# Patient Record
Sex: Male | Born: 1950
Health system: Southern US, Community
[De-identification: ages and names within clinical notes are randomized; demographics above are authoritative.]

## PROBLEM LIST (undated history)

## (undated) DIAGNOSIS — K219 Gastro-esophageal reflux disease without esophagitis: Secondary | ICD-10-CM

## (undated) DIAGNOSIS — I10 Essential (primary) hypertension: Secondary | ICD-10-CM

## (undated) DIAGNOSIS — G4730 Sleep apnea, unspecified: Secondary | ICD-10-CM

## (undated) DIAGNOSIS — E119 Type 2 diabetes mellitus without complications: Secondary | ICD-10-CM

## (undated) DIAGNOSIS — Z8619 Personal history of other infectious and parasitic diseases: Secondary | ICD-10-CM

## (undated) HISTORY — DX: Sleep apnea, unspecified: G47.30

## (undated) HISTORY — DX: Personal history of other infectious and parasitic diseases: Z86.19

## (undated) HISTORY — DX: Type 2 diabetes mellitus without complications: E11.9

## (undated) HISTORY — DX: Gastro-esophageal reflux disease without esophagitis: K21.9

## (undated) HISTORY — DX: Essential (primary) hypertension: I10

---

## 2004-08-20 ENCOUNTER — Emergency Department (HOSPITAL_COMMUNITY): Admission: EM | Admit: 2004-08-20 | Discharge: 2004-08-20 | Payer: Self-pay | Admitting: Family Medicine

## 2008-03-12 ENCOUNTER — Ambulatory Visit: Admission: RE | Admit: 2008-03-12 | Discharge: 2008-03-12 | Payer: Self-pay | Admitting: Physician Assistant

## 2011-02-03 ENCOUNTER — Encounter: Payer: Self-pay | Admitting: Cardiovascular Disease

## 2011-02-03 ENCOUNTER — Ambulatory Visit (INDEPENDENT_AMBULATORY_CARE_PROVIDER_SITE_OTHER): Payer: 59 | Admitting: Cardiovascular Disease

## 2011-02-03 DIAGNOSIS — I10 Essential (primary) hypertension: Secondary | ICD-10-CM

## 2011-02-03 DIAGNOSIS — R072 Precordial pain: Secondary | ICD-10-CM

## 2011-02-03 DIAGNOSIS — E1159 Type 2 diabetes mellitus with other circulatory complications: Secondary | ICD-10-CM | POA: Insufficient documentation

## 2011-02-03 DIAGNOSIS — I152 Hypertension secondary to endocrine disorders: Secondary | ICD-10-CM | POA: Insufficient documentation

## 2011-02-10 NOTE — Assessment & Plan Note (Signed)
Summary: np6   Visit Type:  Initial Consult Primary Provider:  Doy Hutching. Caryn Bee  CC:  Chest tightness for about a week.  History of Present Illness: 60 year-old male presents for initial evaluation of chest pain and palpitations.  The patient complians of a constant left-sided chest pain, described as a 'pressure.' The symptom duration is approximately one week. He states that exertion does not increase his symptoms. He denies lightheadedness, weakness, edema, shortness of breath, or syncope. He has no past history of similar symptoms and no history of cardiac problems.  He had some palpitations this am and was documented to have PVC's.   He drinks one cup of coffee daily, and used to drink a lot of caffeine but is not a heavy caffeine drinker.   Current Medications (verified): 1)  Altace 2.5 Mg Caps (Ramipril) .... Take 1 Tablet By Mouth Once A Day 2)  Aspirin Ec 325 Mg Tbec (Aspirin) .... Take One Tablet By Mouth Daily 3)  Multivitamins  Tabs (Multiple Vitamin) .... Take 1 Tablet By Mouth Once A Day  Allergies (verified): No Known Drug Allergies  Past History:  Past medical, surgical, family and social histories (including risk factors) reviewed, and no changes noted (except as noted below).  Past Medical History: Hypertension, essential. Diagnosed 2007.  Obstructive sleep apnea - uses CPAP  Past Surgical History: none  Family History: Reviewed history and no changes required. Mother died CHF at 93 Father had diabetes, but died in an accident at 18 No premature CAD in family  Social History: Reviewed history and no changes required. Works at [employer] three times per week  Review of Systems       Positive for bilateral knee pain, seasonal allergies, otherwise negative except as per HPI.  Vital Signs:  Patient profile:   60 year old male Height:      71 inches Weight:      268.25 pounds BMI:     37.55 Pulse rate:   75 /  minute Pulse rhythm:   regular Resp:     18 per minute BP sitting:   131 / 83  (left arm) Cuff size:   large  Vitals Entered By: Vikki Ports (February 03, 2011 3:54 PM)  Physical Exam  General:  Pt is well-developed, obese male, alert and oriented, no acute distress HEENT: normal Neck: no thyromegaly           JVP normal, carotid upstrokes normal without bruits Lungs: CTA Chest: equal expansion  CV: Apical impulse nondisplaced, RRR without murmur or gallop Abd: soft, NT, positive BS, no HSM, no bruit Back: no CVA tenderness Ext: no clubbing, cyanosis, or edema        femoral pulses 2+ without bruits        pedal pulses 2+ and equal Skin: warm, dry, no rash Neuro: CNII-XII intact,strength 5/5 = b/l    EKG  Procedure date:  02/03/2011  Findings:      NSR with frequent PVC's HR 75 bpm.  Impression & Recommendations:  Problem # 1:  CHEST PAIN, PRECORDIAL (ICD-786.51) The patient has chest pain with typical and atypical features. His underlying CV risk factors include obesity and HTN. Recommend an exercise Myoview stress test to rule out significant ischemia. Will otherwise continue ASA for antiplatelet Rx. Will followup with him after his stress test results are back.  His updated medication list for this problem includes:    Altace 2.5 Mg Caps (Ramipril) .Marland Kitchen... Take  1 tablet by mouth once a day    Aspirin Ec 325 Mg Tbec (Aspirin) .Marland Kitchen... Take one tablet by mouth daily  Orders: Nuclear Stress Test (Nuc Stress Test) EKG w/ Interpretation (93000)  Problem # 2:  HYPERTENSION, BENIGN (ICD-401.1) BP controlled.  BP today: 131/83  His updated medication list for this problem includes:    Altace 2.5 Mg Caps (Ramipril) .Marland Kitchen... Take 1 tablet by mouth once a day    Aspirin Ec 325 Mg Tbec (Aspirin) .Marland Kitchen... Take one tablet by mouth daily  Patient Instructions: 1)  Your physician recommends that you schedule a follow-up appointment as needed.  2)  Your physician recommends that  you continue on your current medications as directed. Please refer to the Current Medication list given to you today. 3)  Your physician has requested that you have an exercise stress myoview.  For further information please visit [email or web address]  Please follow instruction sheet, as given.

## 2011-02-18 ENCOUNTER — Telehealth (INDEPENDENT_AMBULATORY_CARE_PROVIDER_SITE_OTHER): Payer: Self-pay | Admitting: Radiology

## 2011-02-19 ENCOUNTER — Encounter: Payer: Self-pay | Admitting: Cardiology

## 2011-02-19 ENCOUNTER — Ambulatory Visit (HOSPITAL_COMMUNITY): Payer: 59 | Attending: Cardiovascular Disease

## 2011-02-19 DIAGNOSIS — R0789 Other chest pain: Secondary | ICD-10-CM

## 2011-02-19 DIAGNOSIS — R079 Chest pain, unspecified: Secondary | ICD-10-CM | POA: Insufficient documentation

## 2011-02-24 NOTE — Assessment & Plan Note (Addendum)
Summary: Cardiology Nuclear Testing  Nuclear Med Background Indications for Stress Test: Evaluation for Ischemia    History Comments: No documented CAD  Symptoms: Chest Pressure, DOE, Fatigue, Fatigue with Exertion, Palpitations, Rapid HR  Symptoms Comments: Last episode of CP:2 weeks ago   Nuclear Pre-Procedure Cardiac Risk Factors: Hypertension, Obesity Caffeine/Decaff Intake: None NPO After: 4:30 AM Lungs: Clear IV 0.9% NS with Angio Cath: 20g     IV Site: R Antecubital IV Started by: Irean Hong, RN Chest Size (in) 46     Height (in): 71 Weight (lb): 260 BMI: 36.39  Nuclear Med Study 1 or 2 day study:  1 day     Stress Test Type:  Stress Reading MD:  Willa Rough, MD     Referring MD:  Tonny Bollman, MD Resting Radionuclide:  Technetium 29m Tetrofosmin     Resting Radionuclide Dose:  11 mCi  Stress Radionuclide:  Technetium 66m Tetrofosmin     Stress Radionuclide Dose:  33 mCi   Stress Protocol Exercise Time (min):  9:01 min     Max HR:  116 bpm     Predicted Max HR:  162 bpm  Max Systolic BP: 173 mm Hg     Percent Max HR:  71.60 %     METS: 8.2 Rate Pressure Product:  09811  Lexiscan: 0.4 mg   Stress Test Technologist:  [employer], CMA-N     Nuclear Technologist:  Domenic Polite, CNMT  Rest Procedure  Myocardial perfusion imaging was performed at rest 45 minutes following the intravenous administration of Technetium 8m Tetrofosmin.  Stress Procedure  Patient attempted to walk on the treadmill utilizing the Bruce protocol for 7:45, but was unable to obtain his heart rate.   He was then given IV Lexiscan 0.4 mg over 15-seconds with concurrent low level exercise and then Technetium 24m Tetrofosmin was injected at 30-seconds.  There were no significant changes with infusion.  There were occasional PVC's with couplets.    Quantitative spect images were obtained after a 45 minute delay.  QPS Raw Data Images:  Patient motion noted; appropriate software correction  applied. Stress Images:  No diagnostic abnormalities Rest Images:  Same as stress Subtraction (SDS):  No evidence of ischemia. Transient Ischemic Dilatation:  1.13  (Normal <1.22)  Lung/Heart Ratio:  .31  (Normal <0.45)  Quantitative Gated Spect Images QGS EDV:  116 ml QGS ESV:  47 ml QGS EF:  59 % QGS cine images:  Normal  Findings Normal nuclear study      Overall Impression  Exercise Capacity: Lexiscan with moderate exercise BP Response: Normal blood pressure response. Clinical Symptoms: No chest pain ECG Impression: No significant ST segment change suggestive of ischemia. Overall Impression: Normal stress nuclear study. Overall Impression Comments: The patient could not increase heart rate adequately with stress. The study was changed to [place].  Appended Document: Cardiology Nuclear Testing normal result noted. no further cardiac eval needed at this time.  Appended Document: Cardiology Nuclear Testing Pt aware of results by phone.

## 2011-02-24 NOTE — Progress Notes (Signed)
Summary: Nuclear Pre-Procedure  Phone Note Outgoing Call Call back at [facility] Phone [phone]   Call placed by: Stanton Kidney, EMT-P,  February 18, 2011 2:39 PM Call placed to: Patient Action Taken: Phone Call Completed Reason for Call: Confirm/change Appt Summary of Call: Left message with information on Myoview Information Sheet (see scanned document for details). Stanton Kidney, EMT-P  February 18, 2011 2:39 PM      Nuclear Med Background Indications for Stress Test: Evaluation for Ischemia     Symptoms: Chest Pressure, Palpitations    Nuclear Pre-Procedure Cardiac Risk Factors: Hypertension Height (in): 71

## 2011-04-28 NOTE — Procedures (Signed)
NAME:  [name], [name]            ACCOUNT NO.:  [id number]   MEDICAL RECORD NO.:  [id number]          PATIENT TYPE:  OUT   LOCATION:  SLEE                          FACILITY:  [facility]   PHYSICIAN:  Kofi A. Gerilyn Pilgrim, M.D. DATE OF BIRTH:  Nov 07, 1951   DATE OF PROCEDURE:  03/12/2008  DATE OF DISCHARGE:                             SLEEP DISORDER REPORT   POLYSOMNOGRAPHY REPORT   REFERRING PHYSICIAN:  Joette Catching.   RECORDING DATE:  03/05/2008.   INDICATIONS:  A 60 year old man who has fatigue, hypersomnia, and loud  snoring.  He is being evaluated for possible obstructive sleep apnea  syndrome.   MEDICATION:  Ramipril.   Epworth sleepiness scale 19.  BMI 33.   SLEEP STAGE SUMMARY:  The total recording time is 364 minutes.  Sleep  efficiency 70%.  Sleep latency 40 minutes.  REM latency 73 minutes.  Stage N1 at 11%, N2 at 73%, N3 at 0%.  Stage REM sleep 14%.   RESPIRATORY SUMMARY:  The baseline oxygen saturation is 96%.  Lowest 82%  during REM sleep.  The AHI index is 37.   LEG MOVEMENT SUMMARY:  PLM index 9.2.   ELECTROCARDIOGRAM SUMMARY:  Average heart rate 64 with occasional PVCs  observed.   IMPRESSION:  1. Moderate obstructive sleep apnea syndrome.  2. Mild periodic leg movement of sleep.   RECOMMENDATIONS:  Formal titration study.   Thanks for this referral.      Kofi A. Gerilyn Pilgrim, M.D.  Electronically Signed     KAD/MEDQ  D:  03/13/2008  T:  03/13/2008  Job:  [id number]

## 2012-09-13 HISTORY — PX: COLONOSCOPY: SHX174

## 2013-06-09 ENCOUNTER — Encounter: Payer: Self-pay | Admitting: Family Medicine

## 2013-06-09 ENCOUNTER — Ambulatory Visit (INDEPENDENT_AMBULATORY_CARE_PROVIDER_SITE_OTHER): Payer: 59 | Admitting: Family Medicine

## 2013-06-09 VITALS — BP 151/94 | HR 62 | Temp 97.9°F | Ht 71.0 in | Wt 262.8 lb

## 2013-06-09 DIAGNOSIS — I10 Essential (primary) hypertension: Secondary | ICD-10-CM

## 2013-06-09 DIAGNOSIS — M129 Arthropathy, unspecified: Secondary | ICD-10-CM

## 2013-06-09 DIAGNOSIS — M1990 Unspecified osteoarthritis, unspecified site: Secondary | ICD-10-CM

## 2013-06-09 DIAGNOSIS — K219 Gastro-esophageal reflux disease without esophagitis: Secondary | ICD-10-CM

## 2013-06-09 MED ORDER — OMEPRAZOLE 40 MG PO CPDR: 40.0000 mg | DELAYED_RELEASE_CAPSULE | Freq: Every day | ORAL | Status: DC

## 2013-06-09 MED ORDER — RAMIPRIL 5 MG PO CAPS: 5.0000 mg | ORAL_CAPSULE | Freq: Every day | ORAL | Status: DC

## 2013-06-09 MED ORDER — NABUMETONE 750 MG PO TABS: 750.0000 mg | ORAL_TABLET | Freq: Two times a day (BID) | ORAL | Status: DC

## 2013-06-09 NOTE — Patient Instructions (Signed)

## 2013-06-09 NOTE — Progress Notes (Signed)
  Subjective:    Patient ID: [name], male    DOB: 1951/04/11, 63 y.o.   MRN: [id number]  HPI This 62 y.o. male presents for evaluation of hypertension, arthritis, and GERD.  He needs refills on omeprazole, relafen, and rampiril.  He states the rampiril controls his htn well.  He states he needs to take omeprazole daily for GERD which controls it well.  He has arthritis and relafen works well.   Review of Systems    No chest pain, SOB, HA, dizziness, vision change, N/V, diarrhea, constipation, dysuria, urinary urgency or frequency, myalgias, arthralgias or rash.  Objective:   Physical Exam  Vital signs noted  Well developed well nourished male.  HEENT - Head atraumatic Normocephalic                Eyes - PERRLA, Conjuctiva - clear Sclera- Clear EOMI                Ears - EAC's Wnl TM's Wnl Gross Hearing WNL                Nose - Nares patent                 Throat - oropharanx wnl Respiratory - Lungs CTA bilateral Cardiac - RRR S1 and S2 without murmur GI - Abdomen soft Nontender and bowel sounds active x 4 Extremities - No edema. Neuro - Grossly intact. Skin - Bilateral axilla with skin tags.     Assessment & Plan:  Arthritis - Plan: nabumetone (RELAFEN) 750 MG tablet  GERD (gastroesophageal reflux disease) - Plan: omeprazole (PRILOSEC) 40 MG capsule  Essential hypertension, benign - Plan: ramipril (ALTACE) 5 MG capsule  Follow up for separate appointment to have skin tags removed.  Follow up for CPE in 6 months.

## 2013-10-19 ENCOUNTER — Other Ambulatory Visit: Payer: Self-pay

## 2013-12-27 ENCOUNTER — Other Ambulatory Visit: Payer: Self-pay | Admitting: Family Medicine

## 2014-01-29 ENCOUNTER — Encounter: Payer: Self-pay | Admitting: General Practice

## 2014-01-29 ENCOUNTER — Ambulatory Visit (INDEPENDENT_AMBULATORY_CARE_PROVIDER_SITE_OTHER): Payer: 59 | Admitting: General Practice

## 2014-01-29 ENCOUNTER — Encounter: Payer: Self-pay | Admitting: Family Medicine

## 2014-01-29 VITALS — BP 148/92 | HR 69 | Temp 97.1°F | Ht 71.0 in | Wt 268.0 lb

## 2014-01-29 DIAGNOSIS — N390 Urinary tract infection, site not specified: Secondary | ICD-10-CM

## 2014-01-29 DIAGNOSIS — M25561 Pain in right knee: Secondary | ICD-10-CM

## 2014-01-29 DIAGNOSIS — R350 Frequency of micturition: Secondary | ICD-10-CM

## 2014-01-29 DIAGNOSIS — M25569 Pain in unspecified knee: Secondary | ICD-10-CM

## 2014-01-29 DIAGNOSIS — M25562 Pain in left knee: Secondary | ICD-10-CM

## 2014-01-29 LAB — POCT URINALYSIS DIPSTICK
BILIRUBIN UA: NEGATIVE
Blood, UA: NEGATIVE
Glucose, UA: NEGATIVE
KETONES UA: NEGATIVE
LEUKOCYTES UA: NEGATIVE
Nitrite, UA: NEGATIVE
Protein, UA: NEGATIVE
Spec Grav, UA: 1.01
Urobilinogen, UA: NEGATIVE
pH, UA: 7

## 2014-01-29 LAB — POCT UA - MICROSCOPIC ONLY
Bacteria, U Microscopic: NEGATIVE
Casts, Ur, LPF, POC: NEGATIVE
Crystals, Ur, HPF, POC: NEGATIVE
Epithelial cells, urine per micros: NEGATIVE
Mucus, UA: NEGATIVE
RBC, URINE, MICROSCOPIC: NEGATIVE
WBC, UR, HPF, POC: NEGATIVE
Yeast, UA: NEGATIVE

## 2014-01-29 MED ORDER — CIPROFLOXACIN HCL 500 MG PO TABS: 500.0000 mg | ORAL_TABLET | Freq: Two times a day (BID) | ORAL | Status: DC

## 2014-01-29 MED ORDER — IBUPROFEN 800 MG PO TABS: 800.0000 mg | ORAL_TABLET | Freq: Two times a day (BID) | ORAL | Status: DC | PRN

## 2014-01-29 NOTE — Progress Notes (Signed)
   Subjective:    Patient ID: [name], male    DOB: 1951-01-11, 63 y.o.   MRN: [id number]  Urinary Frequency  This is a new problem. The current episode started in the past 7 days. The problem occurs every urination. The problem has been gradually worsening. The quality of the pain is described as aching and burning. The pain is at a severity of 5/10. There has been no fever. He is not sexually active. There is no history of pyelonephritis. Associated symptoms include flank pain, frequency and urgency. Pertinent negatives include no chills. He has tried increased fluids for the symptoms. There is no history of catheterization, kidney stones, recurrent UTIs or a urological procedure.  Reports having knee pain and nabumetone is no longer effective. Reports ibuprofen is very effective.     Review of Systems  Constitutional: Negative for fever and chills.  Respiratory: Negative for chest tightness and shortness of breath.   Cardiovascular: Negative for chest pain and palpitations.  Genitourinary: Positive for dysuria, urgency, frequency and flank pain.  Neurological: Negative for dizziness, weakness and headaches.       Objective:   Physical Exam  Constitutional: He is oriented to person, place, and time. He appears well-developed and well-nourished.  Cardiovascular: Normal rate, regular rhythm and normal heart sounds.   Pulmonary/Chest: Effort normal and breath sounds normal. No respiratory distress. He exhibits no tenderness.  Abdominal: Soft. Bowel sounds are normal. He exhibits no distension. There is tenderness in the suprapubic area.  Neurological: He is alert and oriented to person, place, and time.  Skin: Skin is warm and dry.  Psychiatric: He has a normal mood and affect.     Results for orders placed in visit on 01/29/14  POCT UA - MICROSCOPIC ONLY      Result Value Ref Range   WBC, Ur, HPF, POC NEG     RBC, urine, microscopic NEG     Bacteria, U Microscopic NEG     Mucus, UA NEG     Epithelial cells, urine per micros NEG     Crystals, Ur, HPF, POC NEG     Casts, Ur, LPF, POC NEG     Yeast, UA NEG          Assessment & Plan:  1. Frequent urination  - POCT UA - Microscopic Only - POCT urinalysis dipstick  2. Knee pain, bilateral  - ibuprofen (ADVIL,MOTRIN) 800 MG tablet; Take 1 tablet (800 mg total) by mouth every 12 (twelve) hours as needed.  Dispense: 30 tablet; Refill: 1  3. UTI (urinary tract infection)  - ciprofloxacin (CIPRO) 500 MG tablet; Take 1 tablet (500 mg total) by mouth 2 (two) times daily.  Dispense: 20 tablet; Refill: 0 -increase fluids -void frequently RTO prn Patient verbalized understanding Erby Pian, FNP-C

## 2014-01-29 NOTE — Patient Instructions (Signed)
Urinary Tract Infection  Urinary tract infections (UTIs) can develop anywhere along your urinary tract. Your urinary tract is your body's drainage system for removing wastes and extra water. Your urinary tract includes two kidneys, two ureters, a bladder, and a urethra. Your kidneys are a pair of bean-shaped organs. Each kidney is about the size of your fist. They are located below your ribs, one on each side of your spine.  CAUSES  Infections are caused by microbes, which are microscopic organisms, including fungi, viruses, and bacteria. These organisms are so small that they can only be seen through a microscope. Bacteria are the microbes that most commonly cause UTIs.  SYMPTOMS   Symptoms of UTIs may vary by age and gender of the patient and by the location of the infection. Symptoms in young women typically include a frequent and intense urge to urinate and a painful, burning feeling in the bladder or urethra during urination. Older women and men are more likely to be tired, shaky, and weak and have muscle aches and abdominal pain. A fever may mean the infection is in your kidneys. Other symptoms of a kidney infection include pain in your back or sides below the ribs, nausea, and vomiting.  DIAGNOSIS  To diagnose a UTI, your caregiver will ask you about your symptoms. Your caregiver also will ask to provide a urine sample. The urine sample will be tested for bacteria and white blood cells. White blood cells are made by your body to help fight infection.  TREATMENT   Typically, UTIs can be treated with medication. Because most UTIs are caused by a bacterial infection, they usually can be treated with the use of antibiotics. The choice of antibiotic and length of treatment depend on your symptoms and the type of bacteria causing your infection.  HOME CARE INSTRUCTIONS   If you were prescribed antibiotics, take them exactly as your caregiver instructs you. Finish the medication even if you feel better after you  have only taken some of the medication.   Drink enough water and fluids to keep your urine clear or pale yellow.   Avoid caffeine, tea, and carbonated beverages. They tend to irritate your bladder.   Empty your bladder often. Avoid holding urine for long periods of time.   Empty your bladder before and after sexual intercourse.   After a bowel movement, women should cleanse from front to back. Use each tissue only once.  SEEK MEDICAL CARE IF:    You have back pain.   You develop a fever.   Your symptoms do not begin to resolve within 3 days.  SEEK IMMEDIATE MEDICAL CARE IF:    You have severe back pain or lower abdominal pain.   You develop chills.   You have nausea or vomiting.   You have continued burning or discomfort with urination.  MAKE SURE YOU:    Understand these instructions.   Will watch your condition.   Will get help right away if you are not doing well or get worse.  Document Released: 09/09/2005 Document Revised: 05/31/2012 Document Reviewed: 01/08/2012  [employer] Patient Information 2014 [employer]

## 2014-01-31 NOTE — Telephone Encounter (Signed)
This encounter was created in error - please disregard.

## 2014-02-01 ENCOUNTER — Other Ambulatory Visit: Payer: Self-pay | Admitting: Family Medicine

## 2014-02-01 ENCOUNTER — Other Ambulatory Visit: Payer: Self-pay

## 2014-02-01 DIAGNOSIS — K219 Gastro-esophageal reflux disease without esophagitis: Secondary | ICD-10-CM

## 2014-02-01 DIAGNOSIS — I10 Essential (primary) hypertension: Secondary | ICD-10-CM

## 2014-02-01 MED ORDER — RAMIPRIL 5 MG PO CAPS: 5.0000 mg | ORAL_CAPSULE | Freq: Every day | ORAL | Status: DC

## 2014-02-01 NOTE — Telephone Encounter (Signed)
Last seen 06/09/13  B [place]

## 2014-04-23 ENCOUNTER — Other Ambulatory Visit: Payer: Self-pay | Admitting: General Practice

## 2014-05-25 ENCOUNTER — Other Ambulatory Visit: Payer: Self-pay | Admitting: General Practice

## 2014-06-06 ENCOUNTER — Encounter: Payer: Self-pay | Admitting: Cardiology

## 2014-08-16 ENCOUNTER — Telehealth: Payer: Self-pay | Admitting: Family Medicine

## 2014-08-16 MED ORDER — OMEPRAZOLE 40 MG PO CPDR: DELAYED_RELEASE_CAPSULE | ORAL | Status: DC

## 2014-08-16 NOTE — Telephone Encounter (Signed)
DONE

## 2014-08-24 ENCOUNTER — Encounter: Payer: Self-pay | Admitting: *Deleted

## 2014-09-07 ENCOUNTER — Other Ambulatory Visit: Payer: Self-pay | Admitting: General Practice

## 2014-09-18 ENCOUNTER — Encounter: Payer: 59 | Admitting: Family Medicine

## 2014-09-25 ENCOUNTER — Ambulatory Visit (INDEPENDENT_AMBULATORY_CARE_PROVIDER_SITE_OTHER): Payer: 59

## 2014-09-25 ENCOUNTER — Ambulatory Visit (INDEPENDENT_AMBULATORY_CARE_PROVIDER_SITE_OTHER): Payer: 59 | Admitting: Family Medicine

## 2014-09-25 ENCOUNTER — Encounter: Payer: Self-pay | Admitting: Family Medicine

## 2014-09-25 VITALS — BP 126/79 | HR 64 | Temp 97.5°F | Ht 71.0 in | Wt 270.0 lb

## 2014-09-25 DIAGNOSIS — Z0000 Encounter for general adult medical examination without abnormal findings: Secondary | ICD-10-CM

## 2014-09-25 DIAGNOSIS — I10 Essential (primary) hypertension: Secondary | ICD-10-CM

## 2014-09-25 DIAGNOSIS — Z23 Encounter for immunization: Secondary | ICD-10-CM

## 2014-09-25 LAB — POCT CBC
Granulocyte percent: 67.9 %G (ref 37–80)
HCT, POC: 52.8 % (ref 43.5–53.7)
HEMOGLOBIN: 17.9 g/dL (ref 14.1–18.1)
Lymph, poc: 1.7 (ref 0.6–3.4)
MCH, POC: 32.3 pg — AB (ref 27–31.2)
MCHC: 34 g/dL (ref 31.8–35.4)
MCV: 94.9 fL (ref 80–97)
MPV: 7.9 fL (ref 0–99.8)
POC Granulocyte: 4.3 (ref 2–6.9)
POC LYMPH PERCENT: 27 %L (ref 10–50)
Platelet Count, POC: 216 10*3/uL (ref 142–424)
RBC: 5.6 M/uL (ref 4.69–6.13)
RDW, POC: 13.1 %
WBC: 6.4 10*3/uL (ref 4.6–10.2)

## 2014-09-25 LAB — POCT URINALYSIS DIPSTICK
BILIRUBIN UA: NEGATIVE
GLUCOSE UA: NEGATIVE
Ketones, UA: NEGATIVE
Leukocytes, UA: NEGATIVE
Nitrite, UA: NEGATIVE
PH UA: 6
Protein, UA: NEGATIVE
RBC UA: NEGATIVE
SPEC GRAV UA: 1.01
Urobilinogen, UA: NEGATIVE

## 2014-09-25 LAB — POCT UA - MICROSCOPIC ONLY
BACTERIA, U MICROSCOPIC: NEGATIVE
CASTS, UR, LPF, POC: NEGATIVE
Crystals, Ur, HPF, POC: NEGATIVE
RBC, urine, microscopic: NEGATIVE
Yeast, UA: NEGATIVE

## 2014-09-25 MED ORDER — OMEPRAZOLE 40 MG PO CPDR: 40.0000 mg | DELAYED_RELEASE_CAPSULE | Freq: Every day | ORAL | Status: DC

## 2014-09-25 MED ORDER — RAMIPRIL 5 MG PO CAPS: 5.0000 mg | ORAL_CAPSULE | Freq: Every day | ORAL | Status: DC

## 2014-09-25 NOTE — Progress Notes (Signed)
   Subjective:    Patient ID: [name], male    DOB: 19-Nov-1951, 63 y.o.   MRN: [id number]  HPI   Patient Active Problem List   Diagnosis Date Noted  . HYPERTENSION, BENIGN 02/03/2011  . CHEST PAIN, PRECORDIAL 02/03/2011   Outpatient Encounter Prescriptions as of 09/25/2014  Medication Sig  . ciprofloxacin (CIPRO) 500 MG tablet Take 1 tablet (500 mg total) by mouth 2 (two) times daily.  Marland Kitchen ibuprofen (ADVIL,MOTRIN) 800 MG tablet TAKE 1 TABLET BY MOUTH EVERY 12 HOURS AS NEEDED.  . nabumetone (RELAFEN) 750 MG tablet Take 1 tablet (750 mg total) by mouth 2 (two) times daily.  Marland Kitchen omeprazole (PRILOSEC) 40 MG capsule TAKE 1 CAPSULE BY MOUTH EVERY DAY  . ramipril (ALTACE) 5 MG capsule TAKE 1 CAPSULE BY MOUTH EVERY DAY  . ramipril (ALTACE) 5 MG capsule TAKE 1 CAPSULE BY MOUTH DAILY     Review of Systems     Objective:   Physical Exam        Assessment & Plan:

## 2014-09-25 NOTE — Progress Notes (Signed)
   Subjective:    Patient ID: [name], male    DOB: [birth year], 63 y.o.   MRN: [id number]  HPI 64 year old gentleman here for a physical and for surgical clearance, as he has in knee replacement surgery scheduled in 2 weeks. I have provided a form with labs including EKG and chest x-ray which will will do as part of the physical. He has no specific complaints except as relates to his knee arthritis. He does mention concern about his white. He described his diet and which certainly sounds compatible with white pulse but it does not happen. He wonders about thyroid issues. We will check that while he is here today in addition to the above requested labs. He has been seeing physical therapy already. He uses a cane appropriately the right hand and has been school about going up and down steps.    Review of Systems  Musculoskeletal: Positive for arthralgias and gait problem.  All other systems reviewed and are negative.      Objective:   Physical Exam  Constitutional: He is oriented to person, place, and time. He appears well-developed and well-nourished.  HENT:  Head: Normocephalic.  Right Ear: External ear normal.  Left Ear: External ear normal.  Nose: Nose normal.  Mouth/Throat: Oropharynx is clear and moist.  Eyes: Conjunctivae and EOM are normal. Pupils are equal, round, and reactive to light.  Neck: Normal range of motion. Neck supple.  Cardiovascular: Normal rate, regular rhythm, normal heart sounds and intact distal pulses.   Pulmonary/Chest: Effort normal and breath sounds normal.  Abdominal: Soft. Bowel sounds are normal.  Genitourinary: Prostate normal.  Musculoskeletal: Normal range of motion.  Neurological: He is alert and oriented to person, place, and time.  Skin: Skin is warm and dry.  Psychiatric: He has a normal mood and affect. His behavior is normal. Judgment and thought content normal.    BP 126/79  Pulse 64  Temp(Src) 97.5 F (36.4 C) (Oral)  Ht $R'5\' 11"'FL$   (1.803 m)  Wt 270 lb (122.471 kg)  BMI 37.67 kg/m2      Assessment & Plan:  1. Annual physical exam  - POCT CBC - CMP14+EGFR - POCT UA - Microscopic Only - POCT urinalysis dipstick - DG Chest 2 View; Future - EKG 12-Lead - PSA, total and free - TSH  2. Need for Tdap vaccination  - Tdap vaccine greater than or equal to 7yo IM  3. HYPERTENSION, BENIGN BP controlled on ACE  Wardell Honour MD

## 2014-09-25 NOTE — Patient Instructions (Addendum)
Health Maintenance A healthy lifestyle and preventative care can promote health and wellness.  Maintain regular health, dental, and eye exams.  Eat a healthy diet. Foods like vegetables, fruits, whole grains, low-fat dairy products, and lean protein foods contain the nutrients you need and are low in calories. Decrease your intake of foods high in solid fats, added sugars, and salt. Get information about a proper diet from your health care provider, if necessary.  Regular physical exercise is one of the most important things you can do for your health. Most adults should get at least 150 minutes of moderate-intensity exercise (any activity that increases your heart rate and causes you to sweat) each week. In addition, most adults need muscle-strengthening exercises on 2 or more days a week.   Maintain a healthy weight. The body mass index (BMI) is a screening tool to identify possible weight problems. It provides an estimate of body fat based on height and weight. Your health care provider can find your BMI and can help you achieve or maintain a healthy weight. For males 20 years and older:  A BMI below 18.5 is considered underweight.  A BMI of 18.5 to 24.9 is normal.  A BMI of 25 to 29.9 is considered overweight.  A BMI of 30 and above is considered obese.  Maintain normal blood lipids and cholesterol by exercising and minimizing your intake of saturated fat. Eat a balanced diet with plenty of fruits and vegetables. Blood tests for lipids and cholesterol should begin at age 20 and be repeated every 5 years. If your lipid or cholesterol levels are high, you are over age 50, or you are at high risk for heart disease, you may need your cholesterol levels checked more frequently.Ongoing high lipid and cholesterol levels should be treated with medicines if diet and exercise are not working.  If you smoke, find out from your health care provider how to quit. If you do not use tobacco, do not  start.  Lung cancer screening is recommended for adults aged 55-80 years who are at high risk for developing lung cancer because of a history of smoking. A yearly low-dose CT scan of the lungs is recommended for people who have at least a 30-pack-year history of smoking and are current smokers or have quit within the past 15 years. A pack year of smoking is smoking an average of 1 pack of cigarettes a day for 1 year (for example, a 30-pack-year history of smoking could mean smoking 1 pack a day for 30 years or 2 packs a day for 15 years). Yearly screening should continue until the smoker has stopped smoking for at least 15 years. Yearly screening should be stopped for people who develop a health problem that would prevent them from having lung cancer treatment.  If you choose to drink alcohol, do not have more than 2 drinks per day. One drink is considered to be 12 oz (360 mL) of beer, 5 oz (150 mL) of wine, or 1.5 oz (45 mL) of liquor.  Avoid the use of street drugs. Do not share needles with anyone. Ask for help if you need support or instructions about stopping the use of drugs.  High blood pressure causes heart disease and increases the risk of stroke. Blood pressure should be checked at least every 1-2 years. Ongoing high blood pressure should be treated with medicines if weight loss and exercise are not effective.  If you are 45-79 years old, ask your health care provider if   you should take aspirin to prevent heart disease.  Diabetes screening involves taking a blood sample to check your fasting blood sugar level. This should be done once every 3 years after age 45 if you are at a normal weight and without risk factors for diabetes. Testing should be considered at a younger age or be carried out more frequently if you are overweight and have at least 1 risk factor for diabetes.  Colorectal cancer can be detected and often prevented. Most routine colorectal cancer screening begins at the age of 50  and continues through age 75. However, your health care provider may recommend screening at an earlier age if you have risk factors for colon cancer. On a yearly basis, your health care provider may provide home test kits to check for hidden blood in the stool. A small camera at the end of a tube may be used to directly examine the colon (sigmoidoscopy or colonoscopy) to detect the earliest forms of colorectal cancer. Talk to your health care provider about this at age 50 when routine screening begins. A direct exam of the colon should be repeated every 5-10 years through age 75, unless early forms of precancerous polyps or small growths are found.  People who are at an increased risk for hepatitis B should be screened for this virus. You are considered at high risk for hepatitis B if:  You were born in a country where hepatitis B occurs often. Talk with your health care provider about which countries are considered high risk.  Your parents were born in a high-risk country and you have not received a shot to protect against hepatitis B (hepatitis B vaccine).  You have HIV or AIDS.  You use needles to inject street drugs.  You live with, or have sex with, someone who has hepatitis B.  You are a man who has sex with other men (MSM).  You get hemodialysis treatment.  You take certain medicines for conditions like cancer, organ transplantation, and autoimmune conditions.  Hepatitis C blood testing is recommended for all people born from 1945 through 1965 and any individual with known risk factors for hepatitis C.  Healthy men should no longer receive prostate-specific antigen (PSA) blood tests as part of routine cancer screening. Talk to your health care provider about prostate cancer screening.  Testicular cancer screening is not recommended for adolescents or adult males who have no symptoms. Screening includes self-exam, a health care provider exam, and other screening tests. Consult with your  health care provider about any symptoms you have or any concerns you have about testicular cancer.  Practice safe sex. Use condoms and avoid high-risk sexual practices to reduce the spread of sexually transmitted infections (STIs).  You should be screened for STIs, including gonorrhea and chlamydia if:  You are sexually active and are younger than 24 years.  You are older than 24 years, and your health care provider tells you that you are at risk for this type of infection.  Your sexual activity has changed since you were last screened, and you are at an increased risk for chlamydia or gonorrhea. Ask your health care provider if you are at risk.  If you are at risk of being infected with HIV, it is recommended that you take a prescription medicine daily to prevent HIV infection. This is called pre-exposure prophylaxis (PrEP). You are considered at risk if:  You are a man who has sex with other men (MSM).  You are a heterosexual man who   is sexually active with multiple partners.  You take drugs by injection.  You are sexually active with a partner who has HIV.  Talk with your health care provider about whether you are at high risk of being infected with HIV. If you choose to begin PrEP, you should first be tested for HIV. You should then be tested every 3 months for as long as you are taking PrEP.  Use sunscreen. Apply sunscreen liberally and repeatedly throughout the day. You should seek shade when your shadow is shorter than you. Protect yourself by wearing long sleeves, pants, a wide-brimmed hat, and sunglasses year round whenever you are outdoors.  Tell your health care provider of new moles or changes in moles, especially if there is a change in shape or color. Also, tell your health care provider if a mole is larger than the size of a pencil eraser.  A one-time screening for abdominal aortic aneurysm (AAA) and surgical repair of large AAAs by ultrasound is recommended for men aged  44-75 years who are current or former smokers.  Stay current with your vaccines (immunizations). Document Released: 05/28/2008 Document Revised: 12/05/2013 Document Reviewed: 04/27/2011 [facility] Patient Information 2015 [place], [place]. This information is not intended to replace advice given to you by your health care provider. Make sure you discuss any questions you have with your health care provider.    Tdap Vaccine (Tetanus, Diphtheria, Pertussis): What You Need to Know 1. Why get vaccinated? Tetanus, diphtheria and pertussis can be very serious diseases, even for adolescents and adults. Tdap vaccine can protect [place] from these diseases. TETANUS (Lockjaw) causes painful muscle tightening and stiffness, usually all over the body.  It can lead to tightening of muscles in the head and neck so you can't open your mouth, swallow, or sometimes even breathe. Tetanus kills about 1 out of 5 people who are infected. DIPHTHERIA can cause a thick coating to form in the back of the throat.  It can lead to breathing problems, paralysis, heart failure, and death. PERTUSSIS (Whooping Cough) causes severe coughing spells, which can cause difficulty breathing, vomiting and disturbed sleep.  It can also lead to weight loss, incontinence, and rib fractures. Up to 2 in 100 adolescents and 5 in 100 adults with pertussis are hospitalized or have complications, which could include pneumonia or death. These diseases are caused by bacteria. Diphtheria and pertussis are spread from person to person through coughing or sneezing. Tetanus enters the body through cuts, scratches, or wounds. Before vaccines, the [place] saw as many as 200,000 cases a year of diphtheria and pertussis, and hundreds of cases of tetanus. Since vaccination began, tetanus and diphtheria have dropped by about 99% and pertussis by about 80%. 2. Tdap vaccine Tdap vaccine can protect adolescents and adults from tetanus, diphtheria, and  pertussis. One dose of Tdap is routinely given at age 12 or 61. People who did not get Tdap at that age should get it as soon as possible. Tdap is especially important for health care professionals and anyone having close contact with a baby younger than 12 months. Pregnant women should get a dose of Tdap during every pregnancy, to protect the newborn from pertussis. Infants are most at risk for severe, life-threatening complications from pertussis. A similar vaccine, called Td, protects from tetanus and diphtheria, but not pertussis. A Td booster should be given every 10 years. Tdap may be given as one of these boosters if you have not already gotten a dose. Tdap may also  be given after a severe cut or burn to prevent tetanus infection. Your doctor can give you more information. Tdap may safely be given at the same time as other vaccines. 3. Some people should not get this vaccine  If you ever had a life-threatening allergic reaction after a dose of any tetanus, diphtheria, or pertussis containing vaccine, OR if you have a severe allergy to any part of this vaccine, you should not get Tdap. Tell your doctor if you have any severe allergies.  If you had a coma, or long or multiple seizures within 7 days after a childhood dose of DTP or DTaP, you should not get Tdap, unless a cause other than the vaccine was found. You can still get Td.  Talk to your doctor if you:  have epilepsy or another nervous system problem,  had severe pain or swelling after any vaccine containing diphtheria, tetanus or pertussis,  ever had Guillain-Barr Syndrome (GBS),  aren't feeling well on the day the shot is scheduled. 4. Risks of a vaccine reaction With any medicine, including vaccines, there is a chance of side effects. These are usually mild and go away on their own, but serious reactions are also possible. Brief fainting spells can follow a vaccination, leading to injuries from falling. Sitting or lying down  for about 15 minutes can help prevent these. Tell your doctor if you feel dizzy or light-headed, or have vision changes or ringing in the ears. Mild problems following Tdap (Did not interfere with activities)  Pain where the shot was given (about 3 in 4 adolescents or 2 in 3 adults)  Redness or swelling where the shot was given (about 1 person in 5)  Mild fever of at least 100.59F (up to about 1 in 25 adolescents or 1 in 100 adults)  Headache (about 3 or 4 people in 10)  Tiredness (about 1 person in 3 or 4)  Nausea, vomiting, diarrhea, stomach ache (up to 1 in 4 adolescents or 1 in 10 adults)  Chills, body aches, sore joints, rash, swollen glands (uncommon) Moderate problems following Tdap (Interfered with activities, but did not require medical attention)  Pain where the shot was given (about 1 in 5 adolescents or 1 in 100 adults)  Redness or swelling where the shot was given (up to about 1 in 16 adolescents or 1 in 25 adults)  Fever over 102F (about 1 in 100 adolescents or 1 in 250 adults)  Headache (about 3 in 20 adolescents or 1 in 10 adults)  Nausea, vomiting, diarrhea, stomach ache (up to 1 or 3 people in 100)  Swelling of the entire arm where the shot was given (up to about 3 in 100). Severe problems following Tdap (Unable to perform usual activities; required medical attention)  Swelling, severe pain, bleeding and redness in the arm where the shot was given (rare). A severe allergic reaction could occur after any vaccine (estimated less than 1 in a million doses). 5. What if there is a serious reaction? What should I look for?  Look for anything that concerns you, such as signs of a severe allergic reaction, very high fever, or behavior changes. Signs of a severe allergic reaction can include hives, swelling of the face and throat, difficulty breathing, a fast heartbeat, dizziness, and weakness. These would start a few minutes to a few hours after the  vaccination. What should I do?  If you think it is a severe allergic reaction or other emergency that can't wait, call 9-1-1  or get the person to the nearest hospital. Otherwise, call your doctor.  Afterward, the reaction should be reported to the "Vaccine Adverse Event Reporting System" (VAERS). Your doctor might file this report, or you can do it yourself through the VAERS web site at www.vaers.SamedayNews.es, or by calling 4015471053. VAERS is only for reporting reactions. They do not give medical advice.  6. The National Vaccine Injury Compensation Program The [employer] Vaccine Injury Compensation Program (VICP) is a federal program that was created to compensate people who may have been injured by certain vaccines. Persons who believe they may have been injured by a vaccine can learn about the program and about filing a claim by calling 202 170 6037 or visiting the [place] website at GoldCloset.com.ee. 7. How can I learn more?  Ask your doctor.  Call your local or state health department.  Contact the Centers for Disease Control and Prevention (CDC):  Call 781-731-2715 or visit CDC's website at http://hunter.com/. CDC Tdap Vaccine VIS (04/21/12) Document Released: 05/31/2012 Document Revised: 04/16/2014 Document Reviewed: 03/14/2014 [employer] Patient Information 2015 [place], [place]. This information is not intended to replace advice given to you by your health care provider. Make sure you discuss any questions you have with your health care provider. DASH Eating Plan DASH stands for "Dietary Approaches to Stop Hypertension." The DASH eating plan is a healthy eating plan that has been shown to reduce high blood pressure (hypertension). Additional health benefits may include reducing the risk of type 2 diabetes mellitus, heart disease, and stroke. The DASH eating plan may also help with weight loss. WHAT DO I NEED TO KNOW ABOUT THE DASH EATING PLAN? For the DASH eating plan,  you will follow these general guidelines:  Choose foods with a percent daily value for sodium of less than 5% (as listed on the food label).  Use salt-free seasonings or herbs instead of table salt or sea salt.  Check with your health care provider or pharmacist before using salt substitutes.  Eat lower-sodium products, often labeled as "lower sodium" or "no salt added."  Eat fresh foods.  Eat more vegetables, fruits, and low-fat dairy products.  Choose whole grains. Look for the word "whole" as the first word in the ingredient list.  Choose fish and skinless chicken or [place] more often than red meat. Limit fish, poultry, and meat to 6 oz (170 g) each day.  Limit sweets, desserts, sugars, and sugary drinks.  Choose heart-healthy fats.  Limit cheese to 1 oz (28 g) per day.  Eat more home-cooked food and less restaurant, buffet, and fast food.  Limit fried foods.  Cook foods using methods other than frying.  Limit canned vegetables. If you do use them, rinse them well to decrease the sodium.  When eating at a restaurant, ask that your food be prepared with less salt, or no salt if possible. WHAT FOODS CAN I EAT? Seek help from a dietitian for individual calorie needs. Grains Whole grain or whole wheat bread. Brown rice. Whole grain or whole wheat pasta. Quinoa, bulgur, and whole grain cereals. Low-sodium cereals. Corn or whole wheat flour tortillas. Whole grain cornbread. Whole grain crackers. Low-sodium crackers. Vegetables Fresh or frozen vegetables (raw, steamed, roasted, or grilled). Low-sodium or reduced-sodium tomato and vegetable juices. Low-sodium or reduced-sodium tomato sauce and paste. Low-sodium or reduced-sodium canned vegetables.  Fruits All fresh, canned (in natural juice), or frozen fruits. Meat and Other Protein Products Ground beef (85% or leaner), grass-fed beef, or beef trimmed of fat. Skinless chicken or [place].  Ground chicken or [place]. Pork trimmed of  fat. All fish and seafood. Eggs. Dried beans, peas, or lentils. Unsalted nuts and seeds. Unsalted canned beans. Dairy Low-fat dairy products, such as skim or 1% milk, 2% or reduced-fat cheeses, low-fat ricotta or cottage cheese, or plain low-fat yogurt. Low-sodium or reduced-sodium cheeses. Fats and Oils Tub margarines without trans fats. Light or reduced-fat mayonnaise and salad dressings (reduced sodium). Avocado. Safflower, olive, or canola oils. Natural peanut or almond butter. Other Unsalted popcorn and pretzels. The items listed above may not be a complete list of recommended foods or beverages. Contact your dietitian for more options. WHAT FOODS ARE NOT RECOMMENDED? Grains White bread. White pasta. White rice. Refined cornbread. Bagels and croissants. Crackers that contain trans fat. Vegetables Creamed or fried vegetables. Vegetables in a cheese sauce. Regular canned vegetables. Regular canned tomato sauce and paste. Regular tomato and vegetable juices. Fruits Dried fruits. Canned fruit in light or heavy syrup. Fruit juice. Meat and Other Protein Products Fatty cuts of meat. Ribs, chicken wings, bacon, sausage, bologna, salami, chitterlings, fatback, hot dogs, bratwurst, and packaged luncheon meats. Salted nuts and seeds. Canned beans with salt. Dairy Whole or 2% milk, cream, half-and-half, and cream cheese. Whole-fat or sweetened yogurt. Full-fat cheeses or blue cheese. Nondairy creamers and whipped toppings. Processed cheese, cheese spreads, or cheese curds. Condiments Onion and garlic salt, seasoned salt, table salt, and sea salt. Canned and packaged gravies. Worcestershire sauce. Tartar sauce. Barbecue sauce. Teriyaki sauce. Soy sauce, including reduced sodium. Steak sauce. Fish sauce. Oyster sauce. Cocktail sauce. Horseradish. Ketchup and mustard. Meat flavorings and tenderizers. Bouillon cubes. Hot sauce. Tabasco sauce. Marinades. Taco seasonings. Relishes. Fats and Oils Butter,  stick margarine, lard, shortening, ghee, and bacon fat. Coconut, palm kernel, or palm oils. Regular salad dressings. Other Pickles and olives. Salted popcorn and pretzels. The items listed above may not be a complete list of foods and beverages to avoid. Contact your dietitian for more information. WHERE CAN I FIND MORE INFORMATION? National Heart, Lung, and Blood Institute: travelstabloid.com Document Released: 11/19/2011 Document Revised: 04/16/2014 Document Reviewed: 10/04/2013 [facility] Patient Information 2015 [place], [place]. This information is not intended to replace advice given to you by your health care provider. Make sure you discuss any questions you have with your health care provider.

## 2014-09-26 ENCOUNTER — Telehealth: Payer: Self-pay | Admitting: Family Medicine

## 2014-09-26 LAB — CMP14+EGFR
ALT: 34 IU/L (ref 0–44)
AST: 35 IU/L (ref 0–40)
Albumin/Globulin Ratio: 1.7 (ref 1.1–2.5)
Albumin: 4.5 g/dL (ref 3.6–4.8)
Alkaline Phosphatase: 102 IU/L (ref 39–117)
BUN/Creatinine Ratio: 17 (ref 10–22)
BUN: 21 mg/dL (ref 8–27)
CALCIUM: 9.7 mg/dL (ref 8.6–10.2)
CO2: 23 mmol/L (ref 18–29)
CREATININE: 1.25 mg/dL (ref 0.76–1.27)
Chloride: 97 mmol/L (ref 97–108)
GFR calc Af Amer: 71 mL/min/{1.73_m2} (ref >59)
GFR calc non Af Amer: 61 mL/min/{1.73_m2} (ref >59)
Globulin, Total: 2.7 g/dL (ref 1.5–4.5)
Glucose: 103 mg/dL — ABNORMAL HIGH (ref 65–99)
POTASSIUM: 4.7 mmol/L (ref 3.5–5.2)
Sodium: 139 mmol/L (ref 134–144)
TOTAL PROTEIN: 7.2 g/dL (ref 6.0–8.5)
Total Bilirubin: 1.1 mg/dL (ref 0.0–1.2)

## 2014-09-26 LAB — TSH: TSH: 3.84 u[IU]/mL (ref 0.450–4.500)

## 2014-09-26 LAB — PSA, TOTAL AND FREE
PSA FREE: 1.91 ng/mL
PSA, Free Pct: 27.7 %
PSA: 6.9 ng/mL — AB (ref 0.0–4.0)

## 2014-09-26 NOTE — Telephone Encounter (Signed)
Message copied by Waverly Ferrari on Wed Sep 26, 2014 10:58 AM ------      Message from: Wardell Honour      Created: Wed Sep 26, 2014  7:55 AM       Labs are basically okay with the exception of the PSA which is elevated. His exam is normal. I would recommend repeating in 4-6 weeks if still elevated would refer to urology ------

## 2014-10-14 HISTORY — PX: KNEE SURGERY: SHX244

## 2015-05-30 ENCOUNTER — Other Ambulatory Visit: Payer: Self-pay

## 2015-05-30 NOTE — Telephone Encounter (Signed)
Last seen 09/25/14 Dr Sabra Heck

## 2015-05-31 MED ORDER — RAMIPRIL 5 MG PO CAPS: 5.0000 mg | ORAL_CAPSULE | Freq: Every day | ORAL | Status: DC

## 2015-07-03 ENCOUNTER — Ambulatory Visit (INDEPENDENT_AMBULATORY_CARE_PROVIDER_SITE_OTHER): Payer: 59 | Admitting: Physician Assistant

## 2015-07-03 ENCOUNTER — Encounter: Payer: Self-pay | Admitting: Physician Assistant

## 2015-07-03 VITALS — BP 136/79 | HR 67 | Temp 97.1°F | Ht 71.0 in | Wt 271.2 lb

## 2015-07-03 DIAGNOSIS — R351 Nocturia: Secondary | ICD-10-CM | POA: Diagnosis not present

## 2015-07-03 DIAGNOSIS — R350 Frequency of micturition: Secondary | ICD-10-CM | POA: Diagnosis not present

## 2015-07-03 LAB — POCT UA - MICROSCOPIC ONLY
BACTERIA, U MICROSCOPIC: NEGATIVE
CASTS, UR, LPF, POC: NEGATIVE
Crystals, Ur, HPF, POC: NEGATIVE
RBC, urine, microscopic: NEGATIVE
Yeast, UA: NEGATIVE

## 2015-07-03 LAB — POCT URINALYSIS DIPSTICK
BILIRUBIN UA: NEGATIVE
Blood, UA: NEGATIVE
GLUCOSE UA: NEGATIVE
Ketones, UA: NEGATIVE
Leukocytes, UA: NEGATIVE
Nitrite, UA: NEGATIVE
PROTEIN UA: NEGATIVE
Spec Grav, UA: 1.015
Urobilinogen, UA: NEGATIVE
pH, UA: 5

## 2015-07-03 MED ORDER — SULFAMETHOXAZOLE-TRIMETHOPRIM 800-160 MG PO TABS: 1.0000 | ORAL_TABLET | Freq: Two times a day (BID) | ORAL | Status: DC

## 2015-07-03 NOTE — Patient Instructions (Signed)

## 2015-07-03 NOTE — Progress Notes (Signed)
Subjective:     Patient ID: [name], male   DOB: April 09, 1951, 64 y.o.   MRN: [id number]  HPI Pt with lower abd pain and nocturia Sx seem to be progressive  Review of Systems  Constitutional: Positive for fever and chills.  Respiratory: Negative.   Cardiovascular: Negative.   Genitourinary: Positive for dysuria, urgency, frequency, decreased urine volume and difficulty urinating. Negative for hematuria, discharge, penile pain and testicular pain.       Objective:   Physical Exam  Constitutional: He appears well-developed and well-nourished.  Cardiovascular: Normal rate, regular rhythm and normal heart sounds.   Pulmonary/Chest: Effort normal and breath sounds normal.  Abdominal: Soft. Bowel sounds are normal. He exhibits no distension and no mass. There is tenderness. There is no rebound and no guarding.  No CVAT + TTP suprapubic area  Nursing note and vitals reviewed.      Assessment:     Nocturia/dysuria    Plan:     Discussed with pt probable prostate infection Bactrim DS bid x 2 wks Nl course reviewed with pt F/U prn

## 2015-07-18 ENCOUNTER — Other Ambulatory Visit: Payer: Self-pay | Admitting: Family Medicine

## 2015-08-21 ENCOUNTER — Telehealth: Payer: Self-pay | Admitting: Family Medicine

## 2015-08-26 NOTE — Telephone Encounter (Signed)
Looks like he needs office visit

## 2015-09-14 HISTORY — PX: KNEE SURGERY: SHX244

## 2015-09-25 ENCOUNTER — Telehealth: Payer: Self-pay | Admitting: Family Medicine

## 2015-09-25 NOTE — Telephone Encounter (Signed)
EKG faxed today

## 2015-10-01 ENCOUNTER — Encounter: Payer: Self-pay | Admitting: Family Medicine

## 2015-10-01 ENCOUNTER — Ambulatory Visit (INDEPENDENT_AMBULATORY_CARE_PROVIDER_SITE_OTHER): Payer: 59 | Admitting: Family Medicine

## 2015-10-01 ENCOUNTER — Ambulatory Visit: Payer: 59 | Admitting: Family Medicine

## 2015-10-01 VITALS — BP 138/84 | HR 61 | Temp 97.0°F | Ht 71.0 in | Wt 270.4 lb

## 2015-10-01 DIAGNOSIS — I10 Essential (primary) hypertension: Secondary | ICD-10-CM

## 2015-10-01 DIAGNOSIS — R972 Elevated prostate specific antigen [PSA]: Secondary | ICD-10-CM | POA: Diagnosis not present

## 2015-10-01 NOTE — Progress Notes (Signed)
   Subjective:    Patient ID: [name], male    DOB: 06-28-1951, 64 y.o.   MRN: [id number]  HPI 64 year old gentleman here for a surgical clearance exam. One year ago he had a similar exam and had a left total knee replacement and this year he is having the right knee done. He's had no problems other than what relates to his knee pain. He did well with the replacement last year. He has had some prostate infection that was treated here in July. He is asymptomatic now but in looking back in his medical record he had elevated PSA that was not followed up on as recommended 1 year ago. I would like to repeat PSA today. He does [facility] some lab work [facility] including EKG metabolic panel urinalysis and CBC all of which are within normal limits  Patient Active Problem List   Diagnosis Date Noted  . HYPERTENSION, BENIGN 02/03/2011  . CHEST PAIN, PRECORDIAL 02/03/2011   Outpatient Encounter Prescriptions as of 10/01/2015  Medication Sig  . omeprazole (PRILOSEC) 40 MG capsule Take 1 capsule (40 mg total) by mouth daily. TAKE 1 CAPSULE BY MOUTH EVERY DAY  . ramipril (ALTACE) 5 MG capsule TAKE 1 CAPSULE BY MOUTH EVERY DAY  . sulfamethoxazole-trimethoprim (BACTRIM DS,SEPTRA DS) 800-160 MG per tablet Take 1 tablet by mouth 2 (two) times daily.   No facility-administered encounter medications on file as of 10/01/2015.      Review of Systems  Respiratory: Negative.   Cardiovascular: Negative.   Gastrointestinal: Negative.   Musculoskeletal: Positive for arthralgias and gait problem.  Psychiatric/Behavioral: Negative.        Objective:   Physical Exam  Constitutional: He is oriented to person, place, and time. He appears well-developed and well-nourished.  Cardiovascular: Normal rate, regular rhythm, normal heart sounds and intact distal pulses.   Pulmonary/Chest: Effort normal and breath sounds normal.  Musculoskeletal:  Right knee effusion with crepitance  Neurological: He is  alert and oriented to person, place, and time.  Psychiatric: He has a normal mood and affect. Thought content normal.          Assessment & Plan:  1. Elevated PSA No symptoms at present. This is follow-up elevated PSA one year ago - PSA, total and free   2. HYPERTENSION, BENIGN Blood pressure well controlled at 138/84 on 5 mg of ramipril  Wardell Honour MD

## 2015-10-01 NOTE — Addendum Note (Signed)
Addended by: Earlene Plater on: 10/01/2015 09:01 AM   Modules accepted: Miquel Dunn

## 2015-10-02 LAB — PSA, TOTAL AND FREE
PROSTATE SPECIFIC AG, SERUM: 8 ng/mL — AB (ref 0.0–4.0)
PSA, Free Pct: 21.4 %
PSA, Free: 1.71 ng/mL

## 2015-10-04 ENCOUNTER — Telehealth: Payer: Self-pay | Admitting: Family Medicine

## 2015-10-04 NOTE — Progress Notes (Signed)
Patient aware. Wants referral set up for urologist. Having knee surgery on Tuesday the 25th

## 2015-10-07 ENCOUNTER — Telehealth: Payer: Self-pay

## 2015-10-07 NOTE — Telephone Encounter (Signed)
Wants a referral to Urology

## 2015-10-09 NOTE — Telephone Encounter (Signed)
Okay to refer to urology

## 2015-10-28 ENCOUNTER — Ambulatory Visit (INDEPENDENT_AMBULATORY_CARE_PROVIDER_SITE_OTHER): Payer: 59 | Admitting: Family Medicine

## 2015-10-28 VITALS — BP 127/85 | HR 62 | Temp 96.7°F | Wt 251.0 lb

## 2015-10-28 DIAGNOSIS — R040 Epistaxis: Secondary | ICD-10-CM | POA: Diagnosis not present

## 2015-10-28 DIAGNOSIS — K5901 Slow transit constipation: Secondary | ICD-10-CM

## 2015-10-28 DIAGNOSIS — I10 Essential (primary) hypertension: Secondary | ICD-10-CM | POA: Diagnosis not present

## 2015-10-28 DIAGNOSIS — N410 Acute prostatitis: Secondary | ICD-10-CM

## 2015-10-28 DIAGNOSIS — K5900 Constipation, unspecified: Secondary | ICD-10-CM | POA: Insufficient documentation

## 2015-10-28 MED ORDER — OXYMETAZOLINE HCL 0.05 % NA SOLN: 1.0000 | Freq: Three times a day (TID) | NASAL | Status: DC

## 2015-10-28 MED ORDER — CIPROFLOXACIN HCL 500 MG PO TABS: 500.0000 mg | ORAL_TABLET | Freq: Two times a day (BID) | ORAL | Status: DC

## 2015-10-28 MED ORDER — MOMETASONE FUROATE 50 MCG/ACT NA SUSP: 2.0000 | Freq: Every day | NASAL | Status: DC

## 2015-10-28 NOTE — Progress Notes (Signed)
Subjective:  Patient ID: [name], male    DOB: November 24, 1951  Age: 64 y.o. MRN: [id number]  CC: Abdominal Pain and Epistaxis   HPI Jay Radach presents for lower midline abdominal cramping onset during the night 3 nights ago. Hx previous sx relieved byu cipro. Had a very hard painful BM the day before sx begaan. However he denies hematochezia and tenesmus since that time. He is having some frequency of urination as well as passing small amounts multiple times daily.   Epistaxis lasts about 10 minutes this morning. Rare other episodes. CPAP used regularly at home, however he does not use a water chamber.  History Christhian has a past medical history of Hypertension; GERD (gastroesophageal reflux disease); [facility] spotted fever ([id number]); and Sleep apnea.   He has past surgical history that includes Knee surgery (Left, 10/2014).   His family history includes Aneurysm in his sister; Congestive Heart Failure in his mother; Heart disease in his mother.He reports that he has never smoked. He does not have any smokeless tobacco history on file. He reports that he does not drink alcohol or use illicit drugs.  Outpatient Prescriptions Prior to Visit  Medication Sig Dispense Refill  . ramipril (ALTACE) 5 MG capsule TAKE 1 CAPSULE BY MOUTH EVERY DAY 30 capsule 1   No facility-administered medications prior to visit.    ROS Review of Systems  Constitutional: Negative for fever, chills and diaphoresis.  Respiratory: Negative for cough and shortness of breath.   Cardiovascular: Negative for chest pain.  Gastrointestinal: Positive for abdominal pain, constipation and abdominal distention. Negative for nausea, vomiting, diarrhea and blood in stool.  Genitourinary: Positive for frequency. Negative for dysuria, hematuria and flank pain.  Musculoskeletal: Negative for joint swelling and arthralgias.  Skin: Negative for rash.  Neurological: Negative for dizziness and weakness.    Psychiatric/Behavioral: The patient is not nervous/anxious.     Objective:  BP 127/85 mmHg  Pulse 62  Temp(Src) 96.7 F (35.9 C) (Oral)  Wt 251 lb (113.853 kg)  SpO2 97%  BP Readings from Last 3 Encounters:  10/28/15 127/85  10/01/15 138/84  07/03/15 136/79    Wt Readings from Last 3 Encounters:  10/28/15 251 lb (113.853 kg)  10/01/15 270 lb 6.4 oz (122.653 kg)  07/03/15 271 lb 3.2 oz (123.016 kg)     Physical Exam  Constitutional: He is oriented to person, place, and time. He appears well-developed and well-nourished.  HENT:  Head: Normocephalic and atraumatic.  Right Ear: Tympanic membrane and external ear normal. No decreased hearing is noted.  Left Ear: Tympanic membrane and external ear normal. No decreased hearing is noted.  Nose: Mucosal edema present. No nose lacerations or nasal deformity. Epistaxis (Minimal fresh blood in the left nostril) is observed.  Mouth/Throat: No oropharyngeal exudate or posterior oropharyngeal erythema.  Eyes: Pupils are equal, round, and reactive to light.  Neck: Normal range of motion. Neck supple.  Cardiovascular: Normal rate and regular rhythm.   No murmur heard. Pulmonary/Chest: Breath sounds normal. No respiratory distress.  Abdominal: Soft. Bowel sounds are normal. He exhibits no mass. There is tenderness (mild suprapubic). There is no rebound and no guarding.  Neurological: He is alert and oriented to person, place, and time.  Skin: Skin is warm and dry.  Vitals reviewed.   No results found for: HGBA1C  Lab Results  Component Value Date   WBC 6.4 09/25/2014   HGB 17.9 09/25/2014   HCT 52.8 09/25/2014   GLUCOSE 103* 09/25/2014  ALT 34 09/25/2014   AST 35 09/25/2014   NA 139 09/25/2014   K 4.7 09/25/2014   CL 97 09/25/2014   CREATININE 1.25 09/25/2014   BUN 21 09/25/2014   CO2 23 09/25/2014   TSH 3.840 09/25/2014   PSA 8.0* 10/01/2015    No results found.  Assessment & Plan:   Matyas was seen today for  abdominal pain and epistaxis.  Diagnoses and all orders for this visit:  Prostatitis, acute  HYPERTENSION, BENIGN  Slow transit constipation  Epistaxis  Other orders -     mometasone (NASONEX) 50 MCG/ACT nasal spray; Place 2 sprays into the nose daily. -     oxymetazoline (AFRIN NASAL SPRAY) 0.05 % nasal spray; Place 1 spray into both nostrils 3 (three) times daily. For 5 days only. -     ciprofloxacin (CIPRO) 500 MG tablet; Take 1 tablet (500 mg total) by mouth 2 (two) times daily.   I am having Mr. Mastel start on mometasone, oxymetazoline, and ciprofloxacin. I am also having him maintain his ramipril and oxyCODONE-acetaminophen.  Meds ordered this encounter  Medications  . oxyCODONE-acetaminophen (PERCOCET/ROXICET) 5-325 MG tablet    Sig: 1-2 tablets every 6 (six) hours as needed.   . mometasone (NASONEX) 50 MCG/ACT nasal spray    Sig: Place 2 sprays into the nose daily.    Dispense:  17 g    Refill:  12  . oxymetazoline (AFRIN NASAL SPRAY) 0.05 % nasal spray    Sig: Place 1 spray into both nostrils 3 (three) times daily. For 5 days only.    Dispense:  30 mL    Refill:  0  . ciprofloxacin (CIPRO) 500 MG tablet    Sig: Take 1 tablet (500 mg total) by mouth 2 (two) times daily.    Dispense:  28 tablet    Refill:  0     Use afrin 5 days to shrink nasal mucosal swelling. Then start using nasonex daily instead for prevention of bleeding.  Uses metamucil 1 tbsp twice daily and colace stool softener 100 mg twice daily until you are no longer taking any opioid for pain.  Follow-up: Return if symptoms worsen or fail to improve.  Claretta Fraise, M.D.

## 2015-10-28 NOTE — Patient Instructions (Signed)
Use afrin 5 days to shrink nasal mucosal swelling. Then start using nasonex daily instead for prevention of bleeding.  Uses metamucil 1 tbsp twice daily and colace stool softener 100 mg twice daily until you are no longer taking any opioid for pain.

## 2015-11-02 ENCOUNTER — Encounter: Payer: Self-pay | Admitting: Family Medicine

## 2015-11-06 ENCOUNTER — Other Ambulatory Visit: Payer: Self-pay | Admitting: Family Medicine

## 2016-05-29 ENCOUNTER — Other Ambulatory Visit: Payer: Self-pay

## 2016-05-29 ENCOUNTER — Telehealth: Payer: Self-pay | Admitting: Family Medicine

## 2016-05-29 MED ORDER — RAMIPRIL 5 MG PO CAPS: ORAL_CAPSULE | ORAL | Status: DC

## 2016-09-15 ENCOUNTER — Other Ambulatory Visit: Payer: Self-pay | Admitting: Family Medicine

## 2016-10-15 ENCOUNTER — Telehealth: Payer: Self-pay | Admitting: Family Medicine

## 2016-10-15 DIAGNOSIS — I10 Essential (primary) hypertension: Secondary | ICD-10-CM

## 2016-10-15 DIAGNOSIS — N410 Acute prostatitis: Secondary | ICD-10-CM

## 2016-10-15 NOTE — Telephone Encounter (Signed)
Please advise 

## 2016-10-20 ENCOUNTER — Other Ambulatory Visit (INDEPENDENT_AMBULATORY_CARE_PROVIDER_SITE_OTHER): Payer: 59

## 2016-10-20 DIAGNOSIS — Z0000 Encounter for general adult medical examination without abnormal findings: Secondary | ICD-10-CM

## 2016-10-21 ENCOUNTER — Encounter: Payer: Self-pay | Admitting: Nurse Practitioner

## 2016-10-21 ENCOUNTER — Ambulatory Visit (INDEPENDENT_AMBULATORY_CARE_PROVIDER_SITE_OTHER): Payer: 59 | Admitting: Nurse Practitioner

## 2016-10-21 VITALS — BP 146/91 | HR 74 | Temp 98.2°F | Ht 71.0 in | Wt 273.0 lb

## 2016-10-21 DIAGNOSIS — R197 Diarrhea, unspecified: Secondary | ICD-10-CM

## 2016-10-21 LAB — THYROID PANEL WITH TSH
FREE THYROXINE INDEX: 2.3 (ref 1.2–4.9)
T3 Uptake Ratio: 26 % (ref 24–39)
T4, Total: 8.7 ug/dL (ref 4.5–12.0)
TSH: 4.34 u[IU]/mL (ref 0.450–4.500)

## 2016-10-21 LAB — CBC WITH DIFFERENTIAL/PLATELET
BASOS: 0 %
Basophils Absolute: 0 10*3/uL (ref 0.0–0.2)
EOS (ABSOLUTE): 0.1 10*3/uL (ref 0.0–0.4)
EOS: 1 %
HEMATOCRIT: 51.4 % — AB (ref 37.5–51.0)
HEMOGLOBIN: 18.2 g/dL — AB (ref 12.6–17.7)
IMMATURE GRANS (ABS): 0 10*3/uL (ref 0.0–0.1)
IMMATURE GRANULOCYTES: 0 %
LYMPHS: 11 %
Lymphocytes Absolute: 1 10*3/uL (ref 0.7–3.1)
MCH: 33.3 pg — ABNORMAL HIGH (ref 26.6–33.0)
MCHC: 35.4 g/dL (ref 31.5–35.7)
MCV: 94 fL (ref 79–97)
MONOCYTES: 11 %
Monocytes Absolute: 0.9 10*3/uL (ref 0.1–0.9)
NEUTROS PCT: 77 %
Neutrophils Absolute: 6.5 10*3/uL (ref 1.4–7.0)
Platelets: 202 10*3/uL (ref 150–379)
RBC: 5.47 x10E6/uL (ref 4.14–5.80)
RDW: 13.7 % (ref 12.3–15.4)
WBC: 8.5 10*3/uL (ref 3.4–10.8)

## 2016-10-21 LAB — CMP14+EGFR
ALBUMIN: 4 g/dL (ref 3.6–4.8)
ALT: 21 IU/L (ref 0–44)
AST: 19 IU/L (ref 0–40)
Albumin/Globulin Ratio: 1.3 (ref 1.2–2.2)
Alkaline Phosphatase: 114 IU/L (ref 39–117)
BUN/Creatinine Ratio: 10 (ref 10–24)
BUN: 14 mg/dL (ref 8–27)
Bilirubin Total: 1.7 mg/dL — ABNORMAL HIGH (ref 0.0–1.2)
CALCIUM: 9.1 mg/dL (ref 8.6–10.2)
CO2: 25 mmol/L (ref 18–29)
Chloride: 94 mmol/L — ABNORMAL LOW (ref 96–106)
Creatinine, Ser: 1.35 mg/dL — ABNORMAL HIGH (ref 0.76–1.27)
GFR, EST AFRICAN AMERICAN: 64 mL/min/{1.73_m2} (ref >59)
GFR, EST NON AFRICAN AMERICAN: 55 mL/min/{1.73_m2} — AB (ref >59)
GLOBULIN, TOTAL: 3.1 g/dL (ref 1.5–4.5)
Glucose: 189 mg/dL — ABNORMAL HIGH (ref 65–99)
Potassium: 4 mmol/L (ref 3.5–5.2)
SODIUM: 137 mmol/L (ref 134–144)
TOTAL PROTEIN: 7.1 g/dL (ref 6.0–8.5)

## 2016-10-21 LAB — PSA, TOTAL AND FREE
PSA FREE PCT: 21.4 %
PSA, Free: 1.69 ng/mL
Prostate Specific Ag, Serum: 7.9 ng/mL — ABNORMAL HIGH (ref 0.0–4.0)

## 2016-10-21 LAB — LIPID PANEL
CHOL/HDL RATIO: 3.9 ratio (ref 0.0–5.0)
Cholesterol, Total: 120 mg/dL (ref 100–199)
HDL: 31 mg/dL — ABNORMAL LOW (ref >39)
LDL CALC: 67 mg/dL (ref 0–99)
TRIGLYCERIDES: 108 mg/dL (ref 0–149)
VLDL Cholesterol Cal: 22 mg/dL (ref 5–40)

## 2016-10-21 LAB — VITAMIN D 25 HYDROXY (VIT D DEFICIENCY, FRACTURES): VIT D 25 HYDROXY: 25.5 ng/mL — AB (ref 30.0–100.0)

## 2016-10-21 NOTE — Progress Notes (Signed)
Subjective:     Loreto Vince is a 65 y.o. male who presents for evaluation of diarrhea. Onset of diarrhea was 4 days ago. Diarrhea is occurring approximately 12 times per day. Patient describes diarrhea as watery. Diarrhea has been associated with abdominal pain described as sharp and only when needs to have bowel movement. Patient denies blood in stool, illness in household contacts, recent antibiotic use, recent travel. Previous visits for diarrhea: none. Evaluation to date: none.  Treatment to date: nothing OTC.  The following portions of the patient's history were reviewed and updated as appropriate: allergies, current medications, past family history, past medical history, past social history, past surgical history and problem list.  Review of Systems Pertinent items noted in HPI and remainder of comprehensive ROS otherwise negative.    Objective:    Ht 5\' 11"  (1.803 m)   Wt 273 lb (123.8 kg)   BMI 38.08 kg/m  General: alert and cooperative  Hydration:  well hydrated  Abdomen:    soft, non-tender; bowel sounds normal; no masses,  no organomegaly   non incarcerated umbilical hernia Assessment:     Diarrhea  Plan:   immodium AD OTC force fluids Stool studies- bring back as soon as possible RTO prn  Mary-Margaret Hassell Done, FNP

## 2016-10-22 ENCOUNTER — Encounter: Payer: Self-pay | Admitting: Family Medicine

## 2016-10-22 ENCOUNTER — Ambulatory Visit (INDEPENDENT_AMBULATORY_CARE_PROVIDER_SITE_OTHER): Payer: 59 | Admitting: Family Medicine

## 2016-10-22 VITALS — BP 138/89 | HR 70 | Temp 97.3°F | Ht 71.0 in | Wt 273.0 lb

## 2016-10-22 DIAGNOSIS — E669 Obesity, unspecified: Secondary | ICD-10-CM | POA: Insufficient documentation

## 2016-10-22 DIAGNOSIS — Z23 Encounter for immunization: Secondary | ICD-10-CM

## 2016-10-22 DIAGNOSIS — R972 Elevated prostate specific antigen [PSA]: Secondary | ICD-10-CM | POA: Insufficient documentation

## 2016-10-22 DIAGNOSIS — E1169 Type 2 diabetes mellitus with other specified complication: Secondary | ICD-10-CM | POA: Diagnosis not present

## 2016-10-22 DIAGNOSIS — I10 Essential (primary) hypertension: Secondary | ICD-10-CM

## 2016-10-22 MED ORDER — IRBESARTAN 150 MG PO TABS: 150.0000 mg | ORAL_TABLET | Freq: Every day | ORAL | 0 refills | Status: DC

## 2016-10-22 NOTE — Progress Notes (Signed)
Subjective:    Patient ID: [name], male    DOB: Aug 27, 1951, 65 y.o.   MRN: [id number]  HPI 65 year old gentleman here for a physical. He had blood work done to days ago which shows new diabetes mild chronic kidney disease, polycythemia, and persistently elevated PSA he was to have seen urologist last year but that never happened. Referral occurred about the same time he had knee replacement and his focus was more on the knee replacement and then seeing the urologist. He has had some diarrhea for the past several days and was seen here yesterday. He still has some pain in his right knee and takes Nucynta after having been tried on gabapentin and Lyrica. He has gained weight some 22 pounds in the last here For blood pressure he takes Remeron Prelone and he does comment on a dry cough. Blood pressure today is 138/89.  Patient Active Problem List   Diagnosis Date Noted  . Prostatitis, acute 10/28/2015  . Constipation 10/28/2015  . HYPERTENSION, BENIGN 02/03/2011   Outpatient Encounter Prescriptions as of 10/22/2016  Medication Sig  . NUCYNTA 50 MG tablet Take 50 mg by mouth 3 (three) times daily as needed. for pain  . ramipril (ALTACE) 5 MG capsule TAKE 1 CAPSULE BY MOUTH EVERY DAY   No facility-administered encounter medications on file as of 10/22/2016.       Review of Systems  Constitutional: Negative.   HENT: Negative.   Respiratory: Positive for cough.   Cardiovascular: Negative.   Endocrine: Positive for polydipsia.  Genitourinary: Positive for difficulty urinating.  Neurological: Negative.   Psychiatric/Behavioral: Negative.        Objective:   Physical Exam  Constitutional: He is oriented to person, place, and time. He appears well-developed and well-nourished.  HENT:  Mouth/Throat: Oropharynx is clear and moist.  Eyes: Pupils are equal, round, and reactive to light.  Neck: Normal range of motion.  Cardiovascular: Normal rate, regular rhythm, normal heart  sounds and intact distal pulses.   Pulmonary/Chest: Effort normal and breath sounds normal.  Abdominal: Soft. Bowel sounds are normal.  Genitourinary:  Genitourinary Comments: On prostate exam with history of recent diarrhea but will be seeing urologist hopefully in the near future  Musculoskeletal: Normal range of motion.  Neurological: He is alert and oriented to person, place, and time.  Psychiatric: He has a normal mood and affect. His behavior is normal. Thought content normal.   BP 138/89   Pulse 70   Temp 97.3 F (36.3 C) (Oral)   Ht 5\' 11"  (1.803 m)   Wt 273 lb (123.8 kg)   BMI 38.08 kg/m         Assessment & Plan:  1. Encounter for immunization    3. Diabetes mellitus type 2 in obese [facility]) We will have him see Tammy for diet consult. For now I've asked him to restrict carbohydrates and work on his weight. I would like to start metformin but he is in the throes of what is probably gastroenteritis with diarrhea. We'll would like to wait until the diarrhea subsides before we potentially complicate matters with metformin which can cause diarrhea   2. HYPERTENSION, BENIGN Since patient has described a dry cough will discontinue ACE inhibitor in phase of her of ARB    4. Elevated PSA, less than 10 ng/ml Needs to see urologist. Had set up referral last year but then he had knee replacement and did not follow through with the urology appointment I'm concerned since this is  chronically elevated. He will probably come to a prostate biopsy  Wardell Honour MD

## 2016-10-22 NOTE — Addendum Note (Signed)
Addended by: Jamelle Haring on: 10/22/2016 05:22 PM   Modules accepted: Orders

## 2016-10-26 ENCOUNTER — Other Ambulatory Visit: Payer: Self-pay | Admitting: Nurse Practitioner

## 2016-10-26 LAB — CDIFF NAA+O+P+STOOL CULTURE
CDIFFPCR: NEGATIVE
E coli, Shiga toxin Assay: NEGATIVE

## 2016-10-26 MED ORDER — AZITHROMYCIN 500 MG PO TABS: 500.0000 mg | ORAL_TABLET | Freq: Every day | ORAL | 0 refills | Status: DC

## 2016-11-11 ENCOUNTER — Encounter: Payer: Self-pay | Admitting: Pharmacist

## 2016-11-11 ENCOUNTER — Ambulatory Visit (INDEPENDENT_AMBULATORY_CARE_PROVIDER_SITE_OTHER): Payer: 59 | Admitting: Pharmacist

## 2016-11-11 VITALS — BP 134/88 | HR 74 | Ht 71.0 in | Wt 274.0 lb

## 2016-11-11 DIAGNOSIS — E1169 Type 2 diabetes mellitus with other specified complication: Secondary | ICD-10-CM | POA: Diagnosis not present

## 2016-11-11 DIAGNOSIS — E669 Obesity, unspecified: Secondary | ICD-10-CM | POA: Diagnosis not present

## 2016-11-11 LAB — BAYER DCA HB A1C WAIVED: HB A1C: 9 % — AB (ref <7.0)

## 2016-11-11 MED ORDER — METFORMIN HCL 500 MG PO TABS: 500.0000 mg | ORAL_TABLET | Freq: Two times a day (BID) | ORAL | 2 refills | Status: DC

## 2016-11-11 MED ORDER — GLUCOSE BLOOD VI STRP: ORAL_STRIP | 4 refills | Status: DC

## 2016-11-11 MED ORDER — ONETOUCH DELICA LANCETS 33G MISC: 4 refills | Status: DC

## 2016-11-11 NOTE — Patient Instructions (Signed)
Diabetes and Standards of Medical Care   Diabetes is complicated. You may find that your diabetes team includes a dietitian, nurse, diabetes educator, eye doctor, and more. To help everyone know what is going on and to help you get the care you deserve, the following schedule of care was developed to help keep you on track. Below are the tests, exams, vaccines, medicines, education, and plans you will need.  Blood Glucose Goals Prior to meals = 80 - 130 Within 2 hours of the start of a meal = less than 180  HbA1c test (goal is less than 7.0% - your last value was %) This test shows how well you have controlled your glucose over the past 2 to 3 months. It is used to see if your diabetes management plan needs to be adjusted.   It is performed at least 2 times a year if you are meeting treatment goals.  It is performed 4 times a year if therapy has changed or if you are not meeting treatment goals.  Blood pressure test  This test is performed at every routine medical visit. The goal is less than 140/90 mmHg for most people, but 130/80 mmHg in some cases. Ask your health care provider about your goal.  Dental exam  Follow up with the dentist regularly.  Eye exam  If you are diagnosed with type 1 diabetes as a child, get an exam upon reaching the age of 10 years or older and have had diabetes for 3 to 5 years. Yearly eye exams are recommended after that initial eye exam.  If you are diagnosed with type 1 diabetes as an adult, get an exam within 5 years of diagnosis and then yearly.  If you are diagnosed with type 2 diabetes, get an exam as soon as possible after the diagnosis and then yearly.  Foot care exam  Visual foot exams are performed at every routine medical visit. The exams check for cuts, injuries, or other problems with the feet.  A comprehensive foot exam should be done yearly. This includes visual inspection as well as assessing foot pulses and testing for loss of  sensation.  Check your feet nightly for cuts, injuries, or other problems with your feet. Tell your health care provider if anything is not healing.  Kidney function test (urine microalbumin)  This test is performed once a year.  Type 1 diabetes: The first test is performed 5 years after diagnosis.  Type 2 diabetes: The first test is performed at the time of diagnosis.  A serum creatinine and estimated glomerular filtration rate (eGFR) test is done once a year to assess the level of chronic kidney disease (CKD), if present.  Lipid profile (cholesterol, HDL, LDL, triglycerides)  Performed every 5 years for most people.  The goal for LDL is less than 100 mg/dL. If you are at high risk, the goal is less than 70 mg/dL.  The goal for HDL is 40 mg/dL to 50 mg/dL for men and 50 mg/dL to 60 mg/dL for women. An HDL cholesterol of 60 mg/dL or higher gives some protection against heart disease.  The goal for triglycerides is less than 150 mg/dL.  Influenza vaccine, pneumococcal vaccine, and hepatitis B vaccine  The influenza vaccine is recommended yearly.  The pneumococcal vaccine is generally given once in a lifetime. However, there are some instances when another vaccination is recommended. Check with your health care provider.  The hepatitis B vaccine is also recommended for adults with diabetes.    Diabetes self-management education  Education is recommended at diagnosis and ongoing as needed.  Treatment plan  Your treatment plan is reviewed at every medical visit.  Document Released: 09/27/2009 Document Revised: 08/02/2013 Document Reviewed: 05/02/2013 [employer] Patient Information 2014 [employer].   

## 2016-11-11 NOTE — Progress Notes (Signed)
Patient ID: [name], male   DOB: 03-05-1951, 65 y.o.   MRN: [id number]   Subjective:    [name] is a 65 y.o. male who presents for an initial evaluation of Type 2 diabetes mellitus.  Current symptoms/problems include polydipsia and have been unchanged. Symptoms have been present for 1 month. His FBG was 189 on 10/20/2016.  I checked A1c today was it was 9.0%.  His father had DM and several paternal uncles and his paternal grandmother.   Known diabetic complications: none Cardiovascular risk factors: advanced age (older than 16 for men, 22 for women), diabetes mellitus, dyslipidemia, hypertension, male gender, obesity (BMI >= 30 kg/m2) and sedentary lifestyle Current diabetic medications include none.   Eye exam current (within one year): no Weight trend: stable Prior visit with CDE: no Current diet: in general, an "unhealthy" diet Current exercise: none Medication Compliance?  Yes  Current monitoring regimen: none Home blood sugar records: not currently checking BG Any episodes of hypoglycemia? no  Is He on ACE inhibitor or angiotensin II receptor blocker?  Yes    irbesartan (Avapro) 150mg  daily Patient experienced ACE cough with ramipril  The following portions of the patient's history were reviewed and updated as appropriate: allergies, current medications, past family history, past medical history, past social history, past surgical history and problem list.    Objective:    There were no vitals taken for this visit.  Lab Review Glucose (mg/dL)  Date Value  10/20/2016 189 (H)  09/25/2014 103 (H)   CO2 (mmol/L)  Date Value  10/20/2016 25  09/25/2014 23   BUN (mg/dL)  Date Value  10/20/2016 14  09/25/2014 21   Creatinine, Ser (mg/dL)  Date Value  10/20/2016 1.35 (H)  09/25/2014 1.25   A1c = 9.0% today    Assessment:    Diabetes Mellitus type II, under inadequate control.    Plan:    1.  Rx changes: start metformin 500mg  take 1 tablet  bid 2.  Education: Reviewed 'ABCs' of diabetes management (respective goals in parentheses):  A1C (<7), blood pressure (<130/80), and cholesterol (LDL <100).  Urine microalbumin checked today 3. Discussed pathophysiology of DM; difference between type 1 and type 2 DM. 4. CHO counting diet discussed.  Reviewed CHO amount in various foods and how to read nutrition labels.  Discussed recommended serving sizes.  5.  Recommend check BG 1  times a day 6.  Recommended increase physical activity - goal is 150 minutes per week 7. Follow up: 1 month    Cherre Robins, PharmD, CPP, CDE

## 2016-11-12 LAB — MICROALBUMIN / CREATININE URINE RATIO
Creatinine, Urine: 174.1 mg/dL
Microalb/Creat Ratio: 7 mg/g creat (ref 0.0–30.0)
Microalbumin, Urine: 12.1 ug/mL

## 2016-11-23 ENCOUNTER — Other Ambulatory Visit: Payer: Self-pay | Admitting: Family Medicine

## 2016-12-15 ENCOUNTER — Encounter: Payer: Self-pay | Admitting: Pharmacist

## 2016-12-15 ENCOUNTER — Ambulatory Visit (INDEPENDENT_AMBULATORY_CARE_PROVIDER_SITE_OTHER): Payer: 59 | Admitting: Pharmacist

## 2016-12-15 VITALS — BP 132/80 | HR 86 | Ht 71.0 in | Wt 274.0 lb

## 2016-12-15 DIAGNOSIS — G479 Sleep disorder, unspecified: Secondary | ICD-10-CM

## 2016-12-15 DIAGNOSIS — E1169 Type 2 diabetes mellitus with other specified complication: Secondary | ICD-10-CM

## 2016-12-15 DIAGNOSIS — E669 Obesity, unspecified: Secondary | ICD-10-CM

## 2016-12-15 NOTE — Progress Notes (Signed)
Patient ID: [name], male   DOB: [birth year], 66 y.o.   MRN: [id number]   Subjective:    [name] is a 66 y.o. male who presents for a re-evaluation of Type 2 diabetes mellitus.  I last saw [name] 11/11/2016 just after his initial type DM diagnosis.  We started metformin 500mg  bid and discussed CHO counting diet at our last visit.  Current symptoms/problems include none - polydipsia has resolved.    Known diabetic complications: none Cardiovascular risk factors: advanced age (older than 21 for men, 29 for women), diabetes mellitus, dyslipidemia, hypertension, male gender, obesity (BMI >= 30 kg/m2) and sedentary lifestyle  Eye exam current (within one year): no Weight trend: stable Prior visit with CDE: yes Current diet: diet has improved greatly.  Patient has decreased serving sizes - eating 1/2 bagel instead of whole; he is trying to eat 3 vegetables a day and a salad.  He is not eating as much bread and has switched to whole grain.  He snacks on nuts - walnuts and almonds.  Current exercise: walking - about 1/2 mile daily until recently (the dog he was walking with died last week and he has not felt like walking since that event but he plans to restart. Medication Compliance?  Yes  Current monitoring regimen: none and home blood tests - checked BG once daily Home blood sugar records: did not bring in glucometer but patient reports BG ranges from 80 to 150. Any episodes of hypoglycemia? no  Is He on ACE inhibitor or angiotensin II receptor blocker?  Yes    irbesartan (Avapro) 150mg  daily Patient experienced ACE cough with ramipril  Patient also mentions that he has been having a more difficulty sleeping the last 30 days.  He has taken melatonin in the past with good results but have not taken in the last 2-3 months.  The following portions of the patient's history were reviewed and updated as appropriate: allergies, current medications, past family history, past  medical history, past social history, past surgical history and problem list.    Objective:    BP 132/80   Pulse 86   Ht 5\' 11"  (1.803 m)   Wt 274 lb (124.3 kg)   BMI 38.22 kg/m   A1c = 9.0% (11/11/2016)  Lab Review Glucose (mg/dL)  Date Value  10/20/2016 189 (H)  09/25/2014 103 (H)   CO2 (mmol/L)  Date Value  10/20/2016 25  09/25/2014 23   BUN (mg/dL)  Date Value  10/20/2016 14  09/25/2014 21   Creatinine, Ser (mg/dL)  Date Value  10/20/2016 1.35 (H)  09/25/2014 1.25       Assessment:    Diabetes Mellitus type II, under improving  control - patient has made great dietary changes and seems motivated to continue changes. Difficulty sleeping   Plan:    1.  Rx changes: start metformin 500mg  take 1 tablet bid   Restart melatonin - take up to 5mg  prior to sleep 2.  Education: Reviewed 'ABCs' of diabetes management (respective goals in parentheses):  A1C (<7), blood pressure (<130/80), and cholesterol (LDL <100). 3. CHO counting diet discussed and reviewed.  4.  Recommend check BG 1  time a day 5.  Recommended increase physical activity - goal is 150 minutes per week.  Patient has plans to restart daily walking this week 6. Follow up: with Dr Sabra Heck 02/12/17  Cherre Robins, PharmD, CPP, CDE  Patient ID: [name], male   DOB: [birth year], 66 y.o.   MRN:  [id number]  

## 2016-12-29 ENCOUNTER — Ambulatory Visit (INDEPENDENT_AMBULATORY_CARE_PROVIDER_SITE_OTHER): Payer: 59 | Admitting: Urology

## 2016-12-29 ENCOUNTER — Other Ambulatory Visit: Payer: Self-pay | Admitting: Urology

## 2016-12-29 ENCOUNTER — Ambulatory Visit: Payer: Self-pay | Admitting: Urology

## 2016-12-29 DIAGNOSIS — R972 Elevated prostate specific antigen [PSA]: Secondary | ICD-10-CM

## 2017-01-19 ENCOUNTER — Encounter (HOSPITAL_COMMUNITY): Payer: Self-pay

## 2017-01-19 ENCOUNTER — Ambulatory Visit (HOSPITAL_COMMUNITY): Payer: 59

## 2017-02-09 ENCOUNTER — Other Ambulatory Visit: Payer: Self-pay | Admitting: Pharmacist

## 2017-02-12 ENCOUNTER — Ambulatory Visit: Payer: Self-pay | Admitting: Family Medicine

## 2017-03-16 DIAGNOSIS — M25561 Pain in right knee: Secondary | ICD-10-CM | POA: Diagnosis not present

## 2017-03-16 DIAGNOSIS — M170 Bilateral primary osteoarthritis of knee: Secondary | ICD-10-CM | POA: Diagnosis not present

## 2017-03-18 ENCOUNTER — Encounter: Payer: Self-pay | Admitting: Family Medicine

## 2017-03-18 ENCOUNTER — Ambulatory Visit (INDEPENDENT_AMBULATORY_CARE_PROVIDER_SITE_OTHER): Payer: 59 | Admitting: Family Medicine

## 2017-03-18 VITALS — BP 137/86 | HR 72 | Temp 97.0°F | Ht 71.0 in | Wt 276.0 lb

## 2017-03-18 DIAGNOSIS — E669 Obesity, unspecified: Secondary | ICD-10-CM

## 2017-03-18 DIAGNOSIS — Z1159 Encounter for screening for other viral diseases: Secondary | ICD-10-CM

## 2017-03-18 DIAGNOSIS — R972 Elevated prostate specific antigen [PSA]: Secondary | ICD-10-CM

## 2017-03-18 DIAGNOSIS — Z114 Encounter for screening for human immunodeficiency virus [HIV]: Secondary | ICD-10-CM

## 2017-03-18 DIAGNOSIS — I10 Essential (primary) hypertension: Secondary | ICD-10-CM

## 2017-03-18 DIAGNOSIS — E1169 Type 2 diabetes mellitus with other specified complication: Secondary | ICD-10-CM

## 2017-03-18 DIAGNOSIS — E119 Type 2 diabetes mellitus without complications: Secondary | ICD-10-CM

## 2017-03-18 LAB — BAYER DCA HB A1C WAIVED: HB A1C (BAYER DCA - WAIVED): 6.7 % (ref <7.0)

## 2017-03-18 NOTE — Progress Notes (Signed)
BP 137/86   Pulse 72   Temp 97 F (36.1 C) (Oral)   Ht 5' 11"  (1.803 m)   Wt 276 lb (125.2 kg)   BMI 38.49 kg/m    Subjective:    Patient ID: [name], male    DOB: February 06, 1951, 66 y.o.   MRN: [id number]  HPI: [name] is a 66 y.o. male presenting on 03/18/2017 for Diabetes (3 month followup) and Hypertension   HPI Type 2 diabetes mellitus Patient comes in today for recheck of his diabetes. Patient has been currently taking metformin 500 which he started in November, his A1c was 9.0 in November. Patient is currently on an ARB. Patient has not seen an ophthalmologist this year but has plans to. Patient denies any issues with his feet.   Hyperlipidemia Patient is coming in for recheck of his hyperlipidemia. He is currently taking diet controlled. He denies any issues with myalgias or history of liver damage from it. He denies any focal numbness or weakness or chest pain.   Elevated PSA Patient is coming in today for recheck of his PSA. He denies any urinary issues.  Relevant past medical, surgical, family and social history reviewed and updated as indicated. Interim medical history since our last visit reviewed. Allergies and medications reviewed and updated.  Review of Systems  Constitutional: Negative for chills and fever.  Respiratory: Negative for shortness of breath and wheezing.   Cardiovascular: Negative for chest pain and leg swelling.  Musculoskeletal: Negative for back pain and gait problem.  Skin: Negative for rash.  Neurological: Negative for dizziness, weakness, light-headedness and headaches.  All other systems reviewed and are negative.   Per HPI unless specifically indicated above      Objective:    BP 137/86   Pulse 72   Temp 97 F (36.1 C) (Oral)   Ht 5' 11"  (1.803 m)   Wt 276 lb (125.2 kg)   BMI 38.49 kg/m   Wt Readings from Last 3 Encounters:  03/18/17 276 lb (125.2 kg)  12/15/16 274 lb (124.3 kg)  11/11/16 274 lb (124.3 kg)    Physical Exam  Constitutional: He is oriented to person, place, and time. He appears well-developed and well-nourished. No distress.  Eyes: Conjunctivae are normal. No scleral icterus.  Neck: Neck supple. No thyromegaly present.  Cardiovascular: Normal rate, regular rhythm, normal heart sounds and intact distal pulses.   No murmur heard. Pulmonary/Chest: Effort normal and breath sounds normal. No respiratory distress. He has no wheezes. He has no rales.  Musculoskeletal: Normal range of motion. He exhibits no edema.  Lymphadenopathy:    He has no cervical adenopathy.  Neurological: He is alert and oriented to person, place, and time. Coordination normal.  Skin: Skin is warm and dry. No rash noted. He is not diaphoretic.  Psychiatric: He has a normal mood and affect. His behavior is normal.  Nursing note and vitals reviewed.     Assessment & Plan:   Problem List Items Addressed This Visit      Cardiovascular and Mediastinum   HYPERTENSION, BENIGN - Primary   Relevant Orders   CMP14+EGFR     Endocrine   Diabetes mellitus type 2 in obese ([place])   Relevant Orders   Bayer DCA Hb A1c Waived     Other   Elevated PSA    Other Visit Diagnoses    Encounter for screening for HIV       Relevant Orders   HIV antibody   Need for  hepatitis C screening test       Relevant Orders   Hepatitis C antibody       Follow up plan: Return in about 3 months (around 06/17/2017), or if symptoms worsen or fail to improve, for Diabetes and hypertension recheck.  Counseling provided for all of the vaccine components Orders Placed This Encounter  Procedures  . CMP14+EGFR  . Bayer DCA Hb A1c Waived  . Hepatitis C antibody  . HIV antibody  . PSA, total and free    Caryl Pina, MD [facility] 03/18/2017, 8:47 AM

## 2017-03-19 LAB — CMP14+EGFR
ALBUMIN: 4.4 g/dL (ref 3.6–4.8)
ALK PHOS: 98 IU/L (ref 39–117)
ALT: 32 IU/L (ref 0–44)
AST: 26 IU/L (ref 0–40)
Albumin/Globulin Ratio: 1.5 (ref 1.2–2.2)
BUN / CREAT RATIO: 18 (ref 10–24)
BUN: 20 mg/dL (ref 8–27)
Bilirubin Total: 0.9 mg/dL (ref 0.0–1.2)
CO2: 24 mmol/L (ref 18–29)
CREATININE: 1.11 mg/dL (ref 0.76–1.27)
Calcium: 9.5 mg/dL (ref 8.6–10.2)
Chloride: 101 mmol/L (ref 96–106)
GFR calc Af Amer: 80 mL/min/{1.73_m2} (ref >59)
GFR calc non Af Amer: 69 mL/min/{1.73_m2} (ref >59)
GLUCOSE: 145 mg/dL — AB (ref 65–99)
Globulin, Total: 3 g/dL (ref 1.5–4.5)
Potassium: 4.5 mmol/L (ref 3.5–5.2)
Sodium: 143 mmol/L (ref 134–144)
Total Protein: 7.4 g/dL (ref 6.0–8.5)

## 2017-03-19 LAB — PSA, TOTAL AND FREE
PROSTATE SPECIFIC AG, SERUM: 6.1 ng/mL — AB (ref 0.0–4.0)
PSA, Free Pct: 27 %
PSA, Free: 1.65 ng/mL

## 2017-03-19 LAB — HEPATITIS C ANTIBODY

## 2017-03-19 LAB — HIV ANTIBODY (ROUTINE TESTING W REFLEX): HIV Screen 4th Generation wRfx: NONREACTIVE

## 2017-03-30 ENCOUNTER — Ambulatory Visit (HOSPITAL_COMMUNITY)
Admission: RE | Admit: 2017-03-30 | Discharge: 2017-03-30 | Disposition: A | Discharge status: Home / Self Care | Payer: Medicare Other | Source: Ambulatory Visit | Attending: Urology | Admitting: Urology

## 2017-03-30 ENCOUNTER — Ambulatory Visit (HOSPITAL_COMMUNITY): Admission: RE | Admit: 2017-03-30 | Payer: Medicare Other | Source: Ambulatory Visit

## 2017-03-30 DIAGNOSIS — Z79899 Other long term (current) drug therapy: Secondary | ICD-10-CM | POA: Diagnosis not present

## 2017-03-30 DIAGNOSIS — K219 Gastro-esophageal reflux disease without esophagitis: Secondary | ICD-10-CM | POA: Diagnosis not present

## 2017-03-30 DIAGNOSIS — Z833 Family history of diabetes mellitus: Secondary | ICD-10-CM | POA: Insufficient documentation

## 2017-03-30 DIAGNOSIS — M1990 Unspecified osteoarthritis, unspecified site: Secondary | ICD-10-CM | POA: Insufficient documentation

## 2017-03-30 DIAGNOSIS — Z8249 Family history of ischemic heart disease and other diseases of the circulatory system: Secondary | ICD-10-CM | POA: Insufficient documentation

## 2017-03-30 DIAGNOSIS — I10 Essential (primary) hypertension: Secondary | ICD-10-CM | POA: Insufficient documentation

## 2017-03-30 DIAGNOSIS — R972 Elevated prostate specific antigen [PSA]: Secondary | ICD-10-CM | POA: Insufficient documentation

## 2017-03-30 DIAGNOSIS — G4730 Sleep apnea, unspecified: Secondary | ICD-10-CM | POA: Diagnosis not present

## 2017-03-30 DIAGNOSIS — Z7982 Long term (current) use of aspirin: Secondary | ICD-10-CM | POA: Insufficient documentation

## 2017-03-30 DIAGNOSIS — E119 Type 2 diabetes mellitus without complications: Secondary | ICD-10-CM | POA: Diagnosis not present

## 2017-03-30 DIAGNOSIS — Z7984 Long term (current) use of oral hypoglycemic drugs: Secondary | ICD-10-CM | POA: Insufficient documentation

## 2017-03-30 MED ORDER — GENTAMICIN SULFATE 40 MG/ML IJ SOLN
160.0000 mg | Freq: Once | INTRAMUSCULAR | Status: AC
  Administered 2017-03-30: 160 mg via INTRAMUSCULAR

## 2017-03-30 MED ORDER — LIDOCAINE HCL (PF) 2 % IJ SOLN
10.0000 mL | Freq: Once | INTRAMUSCULAR | Status: AC
  Administered 2017-03-30: 10 mL

## 2017-03-30 MED ORDER — LIDOCAINE HCL (PF) 2 % IJ SOLN
INTRAMUSCULAR | Status: AC
  Administered 2017-03-30: 10 mL
  Filled 2017-03-30: qty 10

## 2017-03-30 MED ORDER — GENTAMICIN SULFATE 40 MG/ML IJ SOLN
INTRAMUSCULAR | Status: AC
Start: 2017-03-30 — End: 2017-03-30
  Administered 2017-03-30: 160 mg via INTRAMUSCULAR
  Filled 2017-03-30: qty 4

## 2017-03-30 NOTE — Discharge Instructions (Signed)
Transrectal Ultrasound-Guided Biopsy °A transrectal ultrasound-guided biopsy is a procedure to remove samples of tissue from your prostate using ultrasound images to guide the procedure. The procedure is usually done to evaluate the prostate gland of men who have an elevated prostate-specific antigen (PSA). PSA is a blood test to screen for prostate cancer. The biopsy samples are taken to check for prostate cancer. °Tell a health care provider about: °· Any allergies you have. °· All medicines you are taking, including vitamins, herbs, eye drops, creams, and over-the-counter medicines. °· Any problems you or family members have had with anesthetic medicines. °· Any blood disorders you have. °· Any surgeries you have had. °· Any medical conditions you have. °What are the risks? °Generally, this is a safe procedure. However, as with any procedure, problems can occur. Possible problems include: °· Infection of your prostate. °· Bleeding from your rectum or blood in your urine. °· Difficulty urinating. °· Nerve damage (this is usually temporary). °· Damage to surrounding structures such as blood vessels, organs, and muscles, which would require other procedures. °What happens before the procedure? °· Do not eat or drink anything after midnight on the night before the procedure or as directed by your health care provider. °· Take medicines only as directed by your health care provider. °· Your health care provider may have you stop taking certain medicines 5-7 days before the procedure. °· You will be given an enema before the procedure. During an enema, a liquid is injected into your rectum to clear out waste. °· You may have lab tests the day of your procedure. °· Plan to have someone take you home after the procedure. °What happens during the procedure? °· You will be given medicine to help you relax (sedative) before the procedure. An IV tube will be inserted into one of your veins and used to give fluids and  medicine. °· You will be given antibiotic medicine to reduce the risk of an infection. °· You will be placed on your side for the procedure. °· A probe with lubricated gel will be placed into your rectum, and images will be taken of your prostate and surrounding structures. °· Numbing medicine will be injected into the prostate before the biopsy samples are taken. °· A biopsy needle will then be inserted and guided to your prostate with the use of the ultrasound images. °· Samples of prostate tissue will be taken, and the needle will then be removed. °· The biopsy samples will be sent to a lab to be analyzed. Results are usually back in 2-3 days. °What happens after the procedure? °· You will be taken to a recovery area where you will be monitored. °· You may have some discomfort in the rectal area. You will be given pain medicines to control this. °· You may be allowed to go home the same day, or you may need to stay in the hospital overnight. °This information is not intended to replace advice given to you by your health care provider. Make sure you discuss any questions you have with your health care provider. °Document Released: 04/16/2014 Document Revised: 05/07/2016 Document Reviewed: 07/19/2013 °Elsevier Interactive Patient Education © 2017 [employer] ° °

## 2017-04-16 ENCOUNTER — Other Ambulatory Visit: Payer: Self-pay | Admitting: Family Medicine

## 2017-04-28 DIAGNOSIS — M25561 Pain in right knee: Secondary | ICD-10-CM | POA: Diagnosis not present

## 2017-05-12 ENCOUNTER — Other Ambulatory Visit: Payer: Self-pay | Admitting: Family Medicine

## 2017-06-21 ENCOUNTER — Ambulatory Visit (INDEPENDENT_AMBULATORY_CARE_PROVIDER_SITE_OTHER): Payer: Medicare Other | Admitting: Family Medicine

## 2017-06-21 ENCOUNTER — Encounter: Payer: Self-pay | Admitting: Family Medicine

## 2017-06-21 VITALS — BP 132/84 | HR 58 | Temp 97.1°F | Ht 71.0 in | Wt 268.0 lb

## 2017-06-21 DIAGNOSIS — G4730 Sleep apnea, unspecified: Secondary | ICD-10-CM | POA: Insufficient documentation

## 2017-06-21 DIAGNOSIS — R972 Elevated prostate specific antigen [PSA]: Secondary | ICD-10-CM

## 2017-06-21 DIAGNOSIS — G4733 Obstructive sleep apnea (adult) (pediatric): Secondary | ICD-10-CM | POA: Diagnosis not present

## 2017-06-21 DIAGNOSIS — E669 Obesity, unspecified: Secondary | ICD-10-CM | POA: Diagnosis not present

## 2017-06-21 DIAGNOSIS — Z9989 Dependence on other enabling machines and devices: Secondary | ICD-10-CM | POA: Diagnosis not present

## 2017-06-21 DIAGNOSIS — E1169 Type 2 diabetes mellitus with other specified complication: Secondary | ICD-10-CM

## 2017-06-21 DIAGNOSIS — I10 Essential (primary) hypertension: Secondary | ICD-10-CM | POA: Diagnosis not present

## 2017-06-21 LAB — BAYER DCA HB A1C WAIVED: HB A1C (BAYER DCA - WAIVED): 6.1 % (ref <7.0)

## 2017-06-21 NOTE — Progress Notes (Signed)
BP 132/84   Pulse (!) 58   Temp (!) 97.1 F (36.2 C) (Oral)   Ht 5\' 11"  (1.803 m)   Wt 268 lb (121.6 kg)   BMI 37.38 kg/m    Subjective:    Patient ID: [name], male    DOB: 03-03-1951, 66 y.o.   MRN: [id number]  HPI: [name] is a 66 y.o. male presenting on 06/21/2017 for Hypertension   HPI Hypertension Patient is currently on Irbesartan, and their blood pressure today is 132/84. Patient denies any lightheadedness or dizziness. Patient denies headaches, blurred vision, chest pains, shortness of breath, or weakness. Denies any side effects from medication and is content with current medication.   Type 2 diabetes mellitus Patient comes in today for recheck of his diabetes. Patient has been currently taking metformin and last A1c was controlled. Patient is currently on an ACE inhibitor/ARB. Patient has not seen an ophthalmologist this year. Patient denies any issues with their feet.   Sleep apnea Patient has history of sleep apnea and had a CPAP machine but has not had the equipment for her for some time and it's been 9 years since he's had a sleep study. Patient would like to do an outpatient sleep study rather than going to a facility  Elevated PSA. Patient had an elevated PSA and is coming in for recheck. He denies any urinary issues  Relevant past medical, surgical, family and social history reviewed and updated as indicated. Interim medical history since our last visit reviewed. Allergies and medications reviewed and updated.  Review of Systems  Constitutional: Negative for chills and fever.  Eyes: Negative for discharge.  Respiratory: Negative for shortness of breath and wheezing.   Cardiovascular: Negative for chest pain and leg swelling.  Gastrointestinal: Negative for abdominal pain.  Genitourinary: Negative for difficulty urinating and dysuria.  Musculoskeletal: Negative for back pain and gait problem.  Skin: Negative for rash.  Neurological: Negative  for dizziness, light-headedness and numbness.  Psychiatric/Behavioral: Positive for sleep disturbance.  All other systems reviewed and are negative.   Per HPI unless specifically indicated above     Objective:    BP 132/84   Pulse (!) 58   Temp (!) 97.1 F (36.2 C) (Oral)   Ht 5\' 11"  (1.803 m)   Wt 268 lb (121.6 kg)   BMI 37.38 kg/m   Wt Readings from Last 3 Encounters:  06/21/17 268 lb (121.6 kg)  03/18/17 276 lb (125.2 kg)  12/15/16 274 lb (124.3 kg)    Physical Exam  Constitutional: He is oriented to person, place, and time. He appears well-developed and well-nourished. No distress.  Eyes: Conjunctivae are normal. No scleral icterus.  Neck: Neck supple. No thyromegaly present.  Cardiovascular: Normal rate, regular rhythm, normal heart sounds and intact distal pulses.   No murmur heard. Pulmonary/Chest: Effort normal and breath sounds normal. No respiratory distress. He has no wheezes.  Musculoskeletal: Normal range of motion. He exhibits no edema.  Lymphadenopathy:    He has no cervical adenopathy.  Neurological: He is alert and oriented to person, place, and time. Coordination normal.  Skin: Skin is warm and dry. No rash noted. He is not diaphoretic.  Psychiatric: He has a normal mood and affect. His behavior is normal.  Nursing note and vitals reviewed.     Assessment & Plan:   Problem List Items Addressed This Visit      Cardiovascular and Mediastinum   HYPERTENSION, BENIGN - Primary     Respiratory  OSA on CPAP    Has not had a sleep study in 9 years, will do a home sleep study to evaluate his levels on the CPAP. Patient needs new order for supplies and mask, will need sleep study prior to this        Endocrine   Diabetes mellitus type 2 in obese ([place])   Relevant Orders   Bayer DCA Hb A1c Waived (Completed)     Other   Elevated PSA   Relevant Orders   PSA, total and free       Follow up plan: Return in about 3 months (around 09/21/2017), or if  symptoms worsen or fail to improve, for Recheck diabetes.  Counseling provided for all of the vaccine components Orders Placed This Encounter  Procedures  . Bayer DCA Hb A1c Waived  . PSA, total and free    Caryl Pina, MD [facility] 06/21/2017, 9:00 AM

## 2017-06-21 NOTE — Assessment & Plan Note (Signed)
Has not had a sleep study in 9 years, will do a home sleep study to evaluate his levels on the CPAP. Patient needs new order for supplies and mask, will need sleep study prior to this

## 2017-06-23 DIAGNOSIS — M170 Bilateral primary osteoarthritis of knee: Secondary | ICD-10-CM | POA: Diagnosis not present

## 2017-06-23 DIAGNOSIS — M25561 Pain in right knee: Secondary | ICD-10-CM | POA: Diagnosis not present

## 2017-07-07 ENCOUNTER — Other Ambulatory Visit: Payer: Self-pay | Admitting: Family Medicine

## 2017-07-13 DIAGNOSIS — G4730 Sleep apnea, unspecified: Secondary | ICD-10-CM | POA: Diagnosis not present

## 2017-07-14 DIAGNOSIS — G4730 Sleep apnea, unspecified: Secondary | ICD-10-CM | POA: Diagnosis not present

## 2017-07-27 ENCOUNTER — Telehealth: Payer: Self-pay | Admitting: Family Medicine

## 2017-07-27 NOTE — Telephone Encounter (Signed)
Pt had sleep study > 2wks ago  Have results been reviewed? Please advise

## 2017-07-29 NOTE — Telephone Encounter (Signed)
Still have not seen the results, still awaiting them.

## 2017-07-29 NOTE — Telephone Encounter (Signed)
Spoke to pt and advised it usually takes around 3 weeks for [place] to get the results back. Pt voiced understanding.

## 2017-08-10 DIAGNOSIS — M25561 Pain in right knee: Secondary | ICD-10-CM | POA: Diagnosis not present

## 2017-08-10 DIAGNOSIS — M170 Bilateral primary osteoarthritis of knee: Secondary | ICD-10-CM | POA: Diagnosis not present

## 2017-08-10 DIAGNOSIS — Z96651 Presence of right artificial knee joint: Secondary | ICD-10-CM | POA: Diagnosis not present

## 2017-08-10 DIAGNOSIS — S59901S Unspecified injury of right elbow, sequela: Secondary | ICD-10-CM | POA: Diagnosis not present

## 2017-08-10 DIAGNOSIS — Z96652 Presence of left artificial knee joint: Secondary | ICD-10-CM | POA: Diagnosis not present

## 2017-08-10 DIAGNOSIS — M25562 Pain in left knee: Secondary | ICD-10-CM | POA: Diagnosis not present

## 2017-08-13 ENCOUNTER — Encounter: Payer: Self-pay | Admitting: Family Medicine

## 2017-08-13 ENCOUNTER — Ambulatory Visit (INDEPENDENT_AMBULATORY_CARE_PROVIDER_SITE_OTHER): Payer: Medicare Other | Admitting: Family Medicine

## 2017-08-13 VITALS — BP 123/72 | HR 67 | Temp 97.2°F | Ht 71.0 in | Wt 264.0 lb

## 2017-08-13 DIAGNOSIS — R300 Dysuria: Secondary | ICD-10-CM | POA: Diagnosis not present

## 2017-08-13 DIAGNOSIS — R1032 Left lower quadrant pain: Secondary | ICD-10-CM | POA: Diagnosis not present

## 2017-08-13 LAB — URINALYSIS, COMPLETE
Bilirubin, UA: NEGATIVE
Glucose, UA: NEGATIVE
Ketones, UA: NEGATIVE
LEUKOCYTES UA: NEGATIVE
NITRITE UA: NEGATIVE
Protein, UA: NEGATIVE
RBC, UA: NEGATIVE
Specific Gravity, UA: 1.01 (ref 1.005–1.030)
Urobilinogen, Ur: 0.2 mg/dL (ref 0.2–1.0)
pH, UA: 5.5 (ref 5.0–7.5)

## 2017-08-13 LAB — MICROSCOPIC EXAMINATION
Bacteria, UA: NONE SEEN
RBC MICROSCOPIC, UA: NONE SEEN /HPF (ref 0–?)
WBC, UA: NONE SEEN /hpf (ref 0–?)

## 2017-08-13 MED ORDER — AMOXICILLIN-POT CLAVULANATE 875-125 MG PO TABS: 1.0000 | ORAL_TABLET | Freq: Two times a day (BID) | ORAL | 0 refills | Status: DC

## 2017-08-13 NOTE — Patient Instructions (Addendum)
Great to meet you!  Come back or cal lwith any questions  Start augmentin today

## 2017-08-13 NOTE — Progress Notes (Signed)
   HPI  Patient presents today here for an acute visit.  Patient states that over the last 1 day he's had left-sided lower abdominal pain, urinary urgency, and worsening of the pain with stooling. He had some loose stools a few days ago but overall has been stooling normally. He also had a recent episode of constipation.  She denies fever, chills, sweats. He describes a previous episode of similar symptoms of prostatitis.  He states that after he has a bowel movement he has to quickly go back and finished. No problems with urinary hesitancy, he does have a weak stream and also feeling of incomplete voiding.  PMH: Smoking status noted ROS: Per HPI  Objective: BP 123/72 (BP Location: Right Arm)   Pulse 67   Temp (!) 97.2 F (36.2 C) (Oral)   Ht 5\' 11"  (1.803 m)   Wt 264 lb (119.7 kg)   BMI 36.82 kg/m  Gen: NAD, alert, cooperative with exam HEENT: NCAT CV: RRR, good S1/S2, no murmur Resp: CTABL, no wheezes, non-labored Abd: Soft, no guarding, positive bowel sounds, positive tenderness to palpation left lower quadrant Ext: No edema, warm Neuro: Alert and oriented, No gross deficits DRE- no tenderness to palpation of the prostate  Assessment and plan:  #Left lower quadrant abdominal pain Patient here with persistent pain for about one day, He reports normal colonoscopy previously. Clinical exam is reassuring with tenderness over the left lower quadrant Likely early diverticulitis is the most likely diagnosis Consider UTI and prostatitis, however prostate is not tender on exam and urine appears clear. Treat with Augmentin, low threshold for follow-up Discussed utility of CT scan and the patient would like to defer.    Orders Placed This Encounter  Procedures  . Urine Culture  . Urinalysis, Complete    Meds ordered this encounter  Medications  . amoxicillin-clavulanate (AUGMENTIN) 875-125 MG tablet    Sig: Take 1 tablet by mouth 2 (two) times daily.    Dispense:  20  tablet    Refill:  0    Laroy Apple, MD [facility] 08/13/2017, 9:22 AM

## 2017-08-14 LAB — URINE CULTURE

## 2017-09-21 ENCOUNTER — Ambulatory Visit (INDEPENDENT_AMBULATORY_CARE_PROVIDER_SITE_OTHER): Payer: Medicare Other | Admitting: Urology

## 2017-09-21 DIAGNOSIS — R3912 Poor urinary stream: Secondary | ICD-10-CM

## 2017-09-21 DIAGNOSIS — R972 Elevated prostate specific antigen [PSA]: Secondary | ICD-10-CM | POA: Diagnosis not present

## 2017-09-21 DIAGNOSIS — N401 Enlarged prostate with lower urinary tract symptoms: Secondary | ICD-10-CM

## 2017-09-22 ENCOUNTER — Ambulatory Visit (INDEPENDENT_AMBULATORY_CARE_PROVIDER_SITE_OTHER): Payer: Medicare Other | Admitting: Family Medicine

## 2017-09-22 ENCOUNTER — Encounter: Payer: Self-pay | Admitting: Family Medicine

## 2017-09-22 VITALS — BP 130/80 | HR 61 | Temp 97.3°F | Ht 71.0 in | Wt 263.0 lb

## 2017-09-22 DIAGNOSIS — E669 Obesity, unspecified: Secondary | ICD-10-CM | POA: Diagnosis not present

## 2017-09-22 DIAGNOSIS — I10 Essential (primary) hypertension: Secondary | ICD-10-CM | POA: Diagnosis not present

## 2017-09-22 DIAGNOSIS — R972 Elevated prostate specific antigen [PSA]: Secondary | ICD-10-CM

## 2017-09-22 DIAGNOSIS — Z23 Encounter for immunization: Secondary | ICD-10-CM | POA: Diagnosis not present

## 2017-09-22 DIAGNOSIS — E1169 Type 2 diabetes mellitus with other specified complication: Secondary | ICD-10-CM

## 2017-09-22 LAB — BAYER DCA HB A1C WAIVED: HB A1C: 6.3 % (ref <7.0)

## 2017-09-22 NOTE — Progress Notes (Signed)
BP 130/80 (BP Location: Left Arm)   Pulse 61   Temp (!) 97.3 F (36.3 C) (Oral)   Ht 5' 11"  (1.803 m)   Wt 263 lb (119.3 kg)   BMI 36.68 kg/m    Subjective:    Patient ID: [name], male    DOB: 1951-12-06, 66 y.o.   MRN: [id number]  HPI: Burke Terry is a 66 y.o. male presenting on 09/22/2017 for Diabetes (3 mo) and Hypertension   HPI Type 2 diabetes mellitus Patient comes in today for recheck of his diabetes. Patient has been currently taking metformin and his last A1c was 6.1. He says he has not been checking his blood sugars very frequently at this point. Patient is currently on an ACE inhibitor/ARB. Patient has not seen an ophthalmologist this year. Patient denies any issues with their feet.   Hypertension Patient is currently on irbesartan, and their blood pressure today is 130/80. Patient denies any lightheadedness or dizziness. Patient denies headaches, blurred vision, chest pains, shortness of breath, or weakness. Denies any side effects from medication and is content with current medication.   Elevated PSA Patient is coming in for recheck of PSA because he has had an elevated PSA. He had a biopsy earlier in the year. The biopsy came back normal and he has not been having any issues with urination pain or frequency. He denies nocturia.  Relevant past medical, surgical, family and social history reviewed and updated as indicated. Interim medical history since our last visit reviewed. Allergies and medications reviewed and updated.  Review of Systems  Constitutional: Negative for chills and fever.  Eyes: Negative for visual disturbance.  Respiratory: Negative for shortness of breath and wheezing.   Cardiovascular: Negative for chest pain and leg swelling.  Genitourinary: Negative for decreased urine volume, difficulty urinating, frequency, scrotal swelling, testicular pain and urgency.  Musculoskeletal: Negative for back pain and gait problem.  Skin: Negative  for rash.  Neurological: Negative for dizziness, weakness, light-headedness and numbness.  All other systems reviewed and are negative.   Per HPI unless specifically indicated above     Objective:    BP 130/80 (BP Location: Left Arm)   Pulse 61   Temp (!) 97.3 F (36.3 C) (Oral)   Ht 5' 11"  (1.803 m)   Wt 263 lb (119.3 kg)   BMI 36.68 kg/m   Wt Readings from Last 3 Encounters:  09/22/17 263 lb (119.3 kg)  08/13/17 264 lb (119.7 kg)  06/21/17 268 lb (121.6 kg)    Physical Exam  Constitutional: He is oriented to person, place, and time. He appears well-developed and well-nourished. No distress.  Eyes: Conjunctivae are normal. No scleral icterus.  Neck: Neck supple. No thyromegaly present.  Cardiovascular: Normal rate, regular rhythm, normal heart sounds and intact distal pulses.   No murmur heard. Pulmonary/Chest: Effort normal and breath sounds normal. No respiratory distress. He has no wheezes. He has no rales.  Musculoskeletal: Normal range of motion. He exhibits no edema.  Lymphadenopathy:    He has no cervical adenopathy.  Neurological: He is alert and oriented to person, place, and time. Coordination normal.  Skin: Skin is warm and dry. No rash noted. He is not diaphoretic.  Psychiatric: He has a normal mood and affect. His behavior is normal.  Nursing note and vitals reviewed.       Assessment & Plan:   Problem List Items Addressed This Visit      Cardiovascular and Mediastinum   HYPERTENSION, BENIGN -  Primary   Relevant Orders   CMP14+EGFR   Lipid panel     Endocrine   Diabetes mellitus type 2 in obese ([facility])   Relevant Orders   Bayer DCA Hb A1c Waived   CMP14+EGFR   Lipid panel     Other   Elevated PSA    Being monitored by urology, they want [place] to go ahead and put in the order      Relevant Orders   PSA, total and free       Follow up plan: Return in about 3 months (around 12/23/2017), or if symptoms worsen or fail to improve, for Diabetes  recheck.  Counseling provided for all of the vaccine components Orders Placed This Encounter  Procedures  . Bayer DCA Hb A1c Waived  . CMP14+EGFR  . Lipid panel    Caryl Pina, MD [facility] 09/22/2017, 8:43 AM

## 2017-09-22 NOTE — Assessment & Plan Note (Signed)
Being monitored by urology, they want [place] to go ahead and put in the order

## 2017-09-23 ENCOUNTER — Other Ambulatory Visit: Payer: Self-pay | Admitting: Family Medicine

## 2017-09-23 LAB — CMP14+EGFR
ALT: 29 IU/L (ref 0–44)
AST: 30 IU/L (ref 0–40)
Albumin/Globulin Ratio: 1.4 (ref 1.2–2.2)
Albumin: 4.4 g/dL (ref 3.6–4.8)
Alkaline Phosphatase: 83 IU/L (ref 39–117)
BUN/Creatinine Ratio: 17 (ref 10–24)
BUN: 20 mg/dL (ref 8–27)
Bilirubin Total: 1.4 mg/dL — ABNORMAL HIGH (ref 0.0–1.2)
CALCIUM: 9.5 mg/dL (ref 8.6–10.2)
CHLORIDE: 100 mmol/L (ref 96–106)
CO2: 22 mmol/L (ref 20–29)
Creatinine, Ser: 1.15 mg/dL (ref 0.76–1.27)
GFR calc non Af Amer: 66 mL/min/{1.73_m2} (ref >59)
GFR, EST AFRICAN AMERICAN: 77 mL/min/{1.73_m2} (ref >59)
GLUCOSE: 114 mg/dL — AB (ref 65–99)
Globulin, Total: 3.1 g/dL (ref 1.5–4.5)
Potassium: 4.5 mmol/L (ref 3.5–5.2)
Sodium: 137 mmol/L (ref 134–144)
TOTAL PROTEIN: 7.5 g/dL (ref 6.0–8.5)

## 2017-09-23 LAB — LIPID PANEL
Chol/HDL Ratio: 3.5 ratio (ref 0.0–5.0)
Cholesterol, Total: 129 mg/dL (ref 100–199)
HDL: 37 mg/dL — AB (ref >39)
LDL Calculated: 75 mg/dL (ref 0–99)
TRIGLYCERIDES: 87 mg/dL (ref 0–149)
VLDL CHOLESTEROL CAL: 17 mg/dL (ref 5–40)

## 2017-09-23 LAB — PSA, TOTAL AND FREE
PROSTATE SPECIFIC AG, SERUM: 7.5 ng/mL — AB (ref 0.0–4.0)
PSA, Free Pct: 23.5 %
PSA, Free: 1.76 ng/mL

## 2017-12-23 ENCOUNTER — Encounter: Payer: Self-pay | Admitting: Family Medicine

## 2017-12-23 ENCOUNTER — Ambulatory Visit (INDEPENDENT_AMBULATORY_CARE_PROVIDER_SITE_OTHER): Payer: Medicare Other | Admitting: Family Medicine

## 2017-12-23 VITALS — BP 131/87 | HR 69 | Temp 97.9°F | Ht 71.0 in | Wt 266.0 lb

## 2017-12-23 DIAGNOSIS — I10 Essential (primary) hypertension: Secondary | ICD-10-CM

## 2017-12-23 DIAGNOSIS — E669 Obesity, unspecified: Secondary | ICD-10-CM

## 2017-12-23 DIAGNOSIS — R972 Elevated prostate specific antigen [PSA]: Secondary | ICD-10-CM

## 2017-12-23 DIAGNOSIS — E1169 Type 2 diabetes mellitus with other specified complication: Secondary | ICD-10-CM

## 2017-12-23 DIAGNOSIS — E1159 Type 2 diabetes mellitus with other circulatory complications: Secondary | ICD-10-CM | POA: Diagnosis not present

## 2017-12-23 LAB — BAYER DCA HB A1C WAIVED: HB A1C (BAYER DCA - WAIVED): 6.4 % (ref <7.0)

## 2017-12-23 NOTE — Progress Notes (Signed)
BP 131/87   Pulse 69   Temp 97.9 F (36.6 C) (Oral)   Ht 5\' 11"  (1.803 m)   Wt 266 lb (120.7 kg)   BMI 37.10 kg/m    Subjective:    Patient ID: [name], male    DOB: 07/03/1951, 67 y.o.   MRN: [id number]  HPI: [name] is a 67 y.o. male presenting on 12/23/2017 for Diabetes (follow up; did a screnning at [facility] while with a friend and RBC's were elevated, was told he could have some dehydration) and Hypertension   HPI Type 2 diabetes mellitus Patient comes in today for recheck of his diabetes. Patient has been currently taking metformin. Patient is currently on an ACE inhibitor/ARB. Patient has not seen an ophthalmologist this year. Patient denies any issues with their feet.   Hypertension Patient is currently on irbesartan, and their blood pressure today is 131/87. Patient denies any lightheadedness or dizziness. Patient denies headaches, blurred vision, chest pains, shortness of breath, or weakness. Denies any side effects from medication and is content with current medication.   Elevated PSA Patient has had an elevated PSA around 7 and 6 over the past year.  He has seen a urologist and has had a biopsy which came back normal and we are just monitoring the levels for now.  He denies any new urinary issues.  He does admit to going frequently but he does drink a lot of fluids.  He denies any difficulty starting or stopping his stream.  Relevant past medical, surgical, family and social history reviewed and updated as indicated. Interim medical history since our last visit reviewed. Allergies and medications reviewed and updated.  Review of Systems  Constitutional: Negative for chills and fever.  Respiratory: Negative for shortness of breath and wheezing.   Cardiovascular: Negative for chest pain and leg swelling.  Musculoskeletal: Negative for back pain and gait problem.  Skin: Negative for rash.  Neurological: Negative for dizziness, weakness, light-headedness and  numbness.  All other systems reviewed and are negative.   Per HPI unless specifically indicated above        Objective:    BP 131/87   Pulse 69   Temp 97.9 F (36.6 C) (Oral)   Ht 5\' 11"  (1.803 m)   Wt 266 lb (120.7 kg)   BMI 37.10 kg/m   Wt Readings from Last 3 Encounters:  12/23/17 266 lb (120.7 kg)  09/22/17 263 lb (119.3 kg)  08/13/17 264 lb (119.7 kg)    Physical Exam  Constitutional: He is oriented to person, place, and time. He appears well-developed and well-nourished. No distress.  Eyes: Conjunctivae are normal. No scleral icterus.  Neck: Neck supple. No thyromegaly present.  Cardiovascular: Normal rate, regular rhythm, normal heart sounds and intact distal pulses.  No murmur heard. Pulmonary/Chest: Effort normal and breath sounds normal. No respiratory distress. He has no wheezes. He has no rales.  Musculoskeletal: Normal range of motion. He exhibits no edema.  Lymphadenopathy:    He has no cervical adenopathy.  Neurological: He is alert and oriented to person, place, and time. Coordination normal.  Skin: Skin is warm and dry. No rash noted. He is not diaphoretic.  Psychiatric: He has a normal mood and affect. His behavior is normal.  Nursing note and vitals reviewed.      Assessment & Plan:   Problem List Items Addressed This Visit      Cardiovascular and Mediastinum   Hypertension associated with diabetes ([place])  Endocrine   Diabetes mellitus type 2 in obese [facility]) - Primary   Relevant Orders   PSA, total and free     Other   Elevated PSA   Relevant Orders   Bayer DCA Hb A1c Waived    Continue current medications, will check labs and see from there.  Follow up plan: Return in about 3 months (around 03/23/2018), or if symptoms worsen or fail to improve, for Diabetes and hypertension recheck.  Counseling provided for all of the vaccine components Orders Placed This Encounter  Procedures  . Bayer DCA Hb A1c Waived  . PSA, total and free     Caryl Pina, MD [facility] 12/23/2017, 8:25 AM

## 2017-12-24 LAB — PSA, TOTAL AND FREE
PSA FREE PCT: 20.7 %
PSA, Free: 1.9 ng/mL
Prostate Specific Ag, Serum: 9.2 ng/mL — ABNORMAL HIGH (ref 0.0–4.0)

## 2018-01-11 ENCOUNTER — Other Ambulatory Visit: Payer: Self-pay | Admitting: Family Medicine

## 2018-03-13 ENCOUNTER — Other Ambulatory Visit: Payer: Self-pay | Admitting: Family Medicine

## 2018-03-24 ENCOUNTER — Ambulatory Visit: Payer: 59 | Admitting: Family Medicine

## 2018-04-11 ENCOUNTER — Other Ambulatory Visit: Payer: Self-pay | Admitting: Family Medicine

## 2018-04-15 ENCOUNTER — Encounter: Payer: Self-pay | Admitting: Family Medicine

## 2018-04-15 ENCOUNTER — Ambulatory Visit (INDEPENDENT_AMBULATORY_CARE_PROVIDER_SITE_OTHER): Payer: Medicare Other | Admitting: Family Medicine

## 2018-04-15 VITALS — BP 124/74 | HR 66 | Temp 98.7°F | Ht 71.0 in | Wt 278.0 lb

## 2018-04-15 DIAGNOSIS — R972 Elevated prostate specific antigen [PSA]: Secondary | ICD-10-CM | POA: Diagnosis not present

## 2018-04-15 DIAGNOSIS — E669 Obesity, unspecified: Secondary | ICD-10-CM

## 2018-04-15 DIAGNOSIS — I10 Essential (primary) hypertension: Secondary | ICD-10-CM

## 2018-04-15 DIAGNOSIS — E1159 Type 2 diabetes mellitus with other circulatory complications: Secondary | ICD-10-CM | POA: Diagnosis not present

## 2018-04-15 DIAGNOSIS — E1169 Type 2 diabetes mellitus with other specified complication: Secondary | ICD-10-CM | POA: Diagnosis not present

## 2018-04-15 DIAGNOSIS — N410 Acute prostatitis: Secondary | ICD-10-CM

## 2018-04-15 LAB — BAYER DCA HB A1C WAIVED: HB A1C (BAYER DCA - WAIVED): 7.8 % — ABNORMAL HIGH (ref <7.0)

## 2018-04-15 MED ORDER — AMOXICILLIN-POT CLAVULANATE 875-125 MG PO TABS: 1.0000 | ORAL_TABLET | Freq: Two times a day (BID) | ORAL | 0 refills | Status: DC

## 2018-04-15 NOTE — Progress Notes (Signed)
BP 124/74   Pulse 66   Temp 98.7 F (37.1 C) (Oral)   Ht 5' 11"  (1.803 m)   Wt 278 lb (126.1 kg)   BMI 38.77 kg/m    Subjective:    Patient ID: [name], male    DOB: 12-Dec-1951, 67 y.o.   MRN: [id number]  HPI: [name] is a 67 y.o. male presenting on 04/15/2018 for Hypertension; Diabetes; and Elevated PSA   HPI Hypertension Patient is currently on the irbesartan, and their blood pressure today is 124/74. Patient denies any lightheadedness or dizziness. Patient denies headaches, blurred vision, chest pains, shortness of breath, or weakness. Denies any side effects from medication and is content with current medication.   Type 2 diabetes mellitus Patient comes in today for recheck of his diabetes. Patient has been currently taking metformin. Patient is currently on an ACE inhibitor/ARB. Patient has not seen an ophthalmologist this year. Patient denies any issues with their feet.   Elevated PSA/prostate lower abdominal issues Patient has been having pain between his scrotum and his rectum in the same area that he had when he previously had prostatitis.  He denies any urinary frequency or blood in his urine.  He has been having some looser stools which is similar to what he says he was treated for prostatitis previously.  He said it did get better that time he did not end up going to see the urologist.  He had an elevated PSA along with that time and wants to have that rechecked.  He denies any fevers or chills or abdominal pain.  Relevant past medical, surgical, family and social history reviewed and updated as indicated. Interim medical history since our last visit reviewed. Allergies and medications reviewed and updated.  Review of Systems  Constitutional: Negative for chills and fever.  Eyes: Negative for discharge.  Respiratory: Negative for shortness of breath and wheezing.   Cardiovascular: Negative for chest pain and leg swelling.  Gastrointestinal: Positive for  diarrhea. Negative for abdominal distention, abdominal pain, blood in stool, nausea and vomiting.  Genitourinary: Negative for difficulty urinating, discharge, dysuria, flank pain, frequency, hematuria, penile pain, penile swelling and testicular pain.  Musculoskeletal: Negative for back pain and gait problem.  Skin: Negative for rash.  All other systems reviewed and are negative.   Per HPI unless specifically indicated above   Allergies as of 04/15/2018      Reactions   Ramipril Cough      Medication List        Accurate as of 04/15/18 11:22 AM. Always use your most recent med list.          aspirin 81 MG chewable tablet Chew 81 mg by mouth daily.   CENTRUM SILVER 50+MEN PO Take 1 tablet by mouth daily.   glucose blood test strip Use to check blood glucose once daily at varying times.  Dispense Verio One Touch Test strips   irbesartan 150 MG tablet Commonly known as:  AVAPRO TAKE ONE TABLET BY MOUTH EVERY DAY   metFORMIN 500 MG tablet Commonly known as:  GLUCOPHAGE TAKE ONE TABLET BY MOUTH TWICE DAILY WITH A MEAL   ONETOUCH DELICA LANCETS 96G Misc Use to check blood glucose once daily at varying times          Objective:    BP 124/74   Pulse 66   Temp 98.7 F (37.1 C) (Oral)   Ht 5' 11"  (1.803 m)   Wt 278 lb (126.1 kg)   BMI  38.77 kg/m   Wt Readings from Last 3 Encounters:  04/15/18 278 lb (126.1 kg)  12/23/17 266 lb (120.7 kg)  09/22/17 263 lb (119.3 kg)    Physical Exam  Constitutional: He is oriented to person, place, and time. He appears well-developed and well-nourished. No distress.  Eyes: Conjunctivae are normal. No scleral icterus.  Neck: Neck supple. No thyromegaly present.  Cardiovascular: Normal rate, regular rhythm, normal heart sounds and intact distal pulses.  No murmur heard. Pulmonary/Chest: Effort normal and breath sounds normal. No respiratory distress. He has no wheezes.  Genitourinary:  Genitourinary Comments: Patient declined  examination today  Musculoskeletal: Normal range of motion. He exhibits no edema.  Lymphadenopathy:    He has no cervical adenopathy.  Neurological: He is alert and oriented to person, place, and time. Coordination normal.  Skin: Skin is warm and dry. No rash noted. He is not diaphoretic.  Psychiatric: He has a normal mood and affect. His behavior is normal.  Nursing note and vitals reviewed.       Assessment & Plan:   Problem List Items Addressed This Visit      Cardiovascular and Mediastinum   Hypertension associated with diabetes ([place]) - Primary   Relevant Orders   CMP14+EGFR (Completed)     Endocrine   Diabetes mellitus type 2 in obese ([place])   Relevant Orders   Bayer DCA Hb A1c Waived (Completed)     Other   Elevated PSA   Relevant Orders   PSA, total and free (Completed)    Other Visit Diagnoses    Acute prostatitis       Relevant Medications   amoxicillin-clavulanate (AUGMENTIN) 875-125 MG tablet   Other Relevant Orders   Ambulatory referral to Urology     Possible recurrent prostatitis versus mild diverticulitis, will give Augmentin because he did well on it last time  Continue other medications, recheck labs today.  Follow up plan: Return in about 3 months (around 07/16/2018), or if symptoms worsen or fail to improve, for Recheck diabetes.  Counseling provided for all of the vaccine components Orders Placed This Encounter  Procedures  . CMP14+EGFR  . Bayer DCA Hb A1c Waived  . PSA, total and free    Caryl Pina, MD [facility] 04/15/2018, 11:22 AM

## 2018-04-16 LAB — CMP14+EGFR
A/G RATIO: 1.6 (ref 1.2–2.2)
ALBUMIN: 4.2 g/dL (ref 3.6–4.8)
ALK PHOS: 92 IU/L (ref 39–117)
ALT: 25 IU/L (ref 0–44)
AST: 23 IU/L (ref 0–40)
BILIRUBIN TOTAL: 1.4 mg/dL — AB (ref 0.0–1.2)
BUN / CREAT RATIO: 10 (ref 10–24)
BUN: 13 mg/dL (ref 8–27)
CHLORIDE: 99 mmol/L (ref 96–106)
CO2: 27 mmol/L (ref 20–29)
Calcium: 9.4 mg/dL (ref 8.6–10.2)
Creatinine, Ser: 1.24 mg/dL (ref 0.76–1.27)
GFR calc non Af Amer: 60 mL/min/{1.73_m2} (ref >59)
GFR, EST AFRICAN AMERICAN: 70 mL/min/{1.73_m2} (ref >59)
GLOBULIN, TOTAL: 2.6 g/dL (ref 1.5–4.5)
GLUCOSE: 120 mg/dL — AB (ref 65–99)
Potassium: 5.1 mmol/L (ref 3.5–5.2)
SODIUM: 137 mmol/L (ref 134–144)
TOTAL PROTEIN: 6.8 g/dL (ref 6.0–8.5)

## 2018-04-16 LAB — PSA, TOTAL AND FREE
PSA FREE PCT: 25.4 %
PSA, Free: 1.98 ng/mL
Prostate Specific Ag, Serum: 7.8 ng/mL — ABNORMAL HIGH (ref 0.0–4.0)

## 2018-04-18 ENCOUNTER — Other Ambulatory Visit: Payer: Self-pay | Admitting: *Deleted

## 2018-04-18 MED ORDER — METFORMIN HCL 1000 MG PO TABS: 1000.0000 mg | ORAL_TABLET | Freq: Two times a day (BID) | ORAL | 3 refills | Status: DC

## 2018-05-03 ENCOUNTER — Encounter: Payer: Self-pay | Admitting: Family Medicine

## 2018-05-03 ENCOUNTER — Ambulatory Visit (INDEPENDENT_AMBULATORY_CARE_PROVIDER_SITE_OTHER): Payer: Medicare Other | Admitting: Family Medicine

## 2018-05-03 VITALS — BP 123/73 | HR 64 | Temp 97.4°F | Ht 71.0 in | Wt 272.2 lb

## 2018-05-03 DIAGNOSIS — W57XXXA Bitten or stung by nonvenomous insect and other nonvenomous arthropods, initial encounter: Secondary | ICD-10-CM | POA: Diagnosis not present

## 2018-05-03 DIAGNOSIS — K625 Hemorrhage of anus and rectum: Secondary | ICD-10-CM | POA: Diagnosis not present

## 2018-05-03 DIAGNOSIS — R1030 Lower abdominal pain, unspecified: Secondary | ICD-10-CM | POA: Diagnosis not present

## 2018-05-03 DIAGNOSIS — R6889 Other general symptoms and signs: Secondary | ICD-10-CM | POA: Diagnosis not present

## 2018-05-03 DIAGNOSIS — I10 Essential (primary) hypertension: Secondary | ICD-10-CM | POA: Diagnosis not present

## 2018-05-03 MED ORDER — METRONIDAZOLE 500 MG PO TABS: 500.0000 mg | ORAL_TABLET | Freq: Three times a day (TID) | ORAL | 0 refills | Status: DC

## 2018-05-03 MED ORDER — DOXYCYCLINE HYCLATE 100 MG PO TABS: 100.0000 mg | ORAL_TABLET | Freq: Two times a day (BID) | ORAL | 0 refills | Status: DC

## 2018-05-03 NOTE — Progress Notes (Signed)
   HPI  Patient presents today for a lot of lower abdominal discomfort.  Patient was seen on May 3, about 2 to 3 weeks ago, for abdominal discomfort.  He states that he improved while he was on Augmentin, then symptoms came back. He has since had a tick bite, he feels the tick was likely present for about 1 week, he complains of fatigue, abdominal cramping, night sweats for about 2 weeks.  Patient also had 2 other ticks on his right hip removed.  He is concerned about [facility] Hights spotted fever, he had this previously.  He also complains of intermittent rectal bleeding.  He states that he will have blood on the stool and in the water also on his toilet paper.  He previously had colonoscopy with him with diverticula, internal hemorrhoid was found after meeting with general surgery at that time.  His PSA was found to be elevated but slightly improved compared to previous.  He does have a history of biopsy that was clear  PMH: Smoking status noted ROS: Per HPI  Objective: BP 123/73   Pulse 64   Temp (!) 97.4 F (36.3 C) (Oral)   Ht _0  (1.803 m)   Wt 272 lb 3.2 oz (123.5 kg)   BMI 37.96 kg/m  Gen: NAD, alert, cooperative with exam HEENT: NCAT CV: RRR, good S1/S2, no murmur Resp: CTABL, no wheezes, non-labored Ext: No edema, warm Neuro: Alert and oriented, No gross deficits DRE hemm at 5:00 external, some mild rectal tenderness, prostate enlarged, symmetric, and non tender to palp  Assessment and plan:  #Tick bite, lower abdominal pain Patient with unusual course for diverticulitis.  Possible diverticulitis today, however abdominal exam is reassuring. He has had a tick bite I am treating him to cover diverticulitis and an unconventional way using doxycycline plus Flagyl. Doxycycline will cover for RMSF, checking for tickborne illness with labs today as well.   #rectal Bleeding Intermittent, he possibly has internal hemorrhoids, however I would like to rule out more serious  source of bleed. Refer to GI CBC  Orders Placed This Encounter  Procedures  . CBC with Differential/Platelet  . CMP14+EGFR  . TSH  . [place] spotted fvr abs pnl(IgG+IgM)  . Lyme Ab/Western Blot Reflex  . Ambulatory referral to Gastroenterology    Referral Priority:   Routine    Referral Type:   Consultation    Referral Reason:   Specialty Services Required    Number of Visits Requested:   1    Meds ordered this encounter  Medications  . doxycycline (VIBRA-TABS) 100 MG tablet    Sig: Take 1 tablet (100 mg total) by mouth 2 (two) times daily. 1 po bid    Dispense:  20 tablet    Refill:  0  . metroNIDAZOLE (FLAGYL) 500 MG tablet    Sig: Take 1 tablet (500 mg total) by mouth 3 (three) times daily.    Dispense:  21 tablet    Refill:  0    Laroy Apple, MD [facility] 05/03/2018, 11:29 AM

## 2018-05-03 NOTE — Patient Instructions (Signed)
Great to see you!  Start doxycyline and flagyl for your abdomen and the tick bite, we are checking for RMSF.

## 2018-05-05 LAB — CBC WITH DIFFERENTIAL/PLATELET
Basophils Absolute: 0 10*3/uL (ref 0.0–0.2)
Basos: 1 %
EOS (ABSOLUTE): 0.2 10*3/uL (ref 0.0–0.4)
EOS: 3 %
HEMATOCRIT: 47.8 % (ref 37.5–51.0)
HEMOGLOBIN: 17 g/dL (ref 13.0–17.7)
IMMATURE GRANULOCYTES: 0 %
Immature Grans (Abs): 0 10*3/uL (ref 0.0–0.1)
LYMPHS ABS: 1.4 10*3/uL (ref 0.7–3.1)
Lymphs: 25 %
MCH: 33.6 pg — ABNORMAL HIGH (ref 26.6–33.0)
MCHC: 35.6 g/dL (ref 31.5–35.7)
MCV: 95 fL (ref 79–97)
MONOCYTES: 10 %
Monocytes Absolute: 0.6 10*3/uL (ref 0.1–0.9)
NEUTROS PCT: 61 %
Neutrophils Absolute: 3.5 10*3/uL (ref 1.4–7.0)
Platelets: 227 10*3/uL (ref 150–450)
RBC: 5.06 x10E6/uL (ref 4.14–5.80)
RDW: 14 % (ref 12.3–15.4)
WBC: 5.7 10*3/uL (ref 3.4–10.8)

## 2018-05-05 LAB — TSH: TSH: 4.37 u[IU]/mL (ref 0.450–4.500)

## 2018-05-05 LAB — CMP14+EGFR
ALBUMIN: 4.3 g/dL (ref 3.6–4.8)
ALT: 23 IU/L (ref 0–44)
AST: 22 IU/L (ref 0–40)
Albumin/Globulin Ratio: 1.5 (ref 1.2–2.2)
Alkaline Phosphatase: 92 IU/L (ref 39–117)
BUN / CREAT RATIO: 14 (ref 10–24)
BUN: 15 mg/dL (ref 8–27)
Bilirubin Total: 1.2 mg/dL (ref 0.0–1.2)
CO2: 22 mmol/L (ref 20–29)
CREATININE: 1.08 mg/dL (ref 0.76–1.27)
Calcium: 9.4 mg/dL (ref 8.6–10.2)
Chloride: 101 mmol/L (ref 96–106)
GFR calc non Af Amer: 71 mL/min/{1.73_m2} (ref >59)
GFR, EST AFRICAN AMERICAN: 82 mL/min/{1.73_m2} (ref >59)
Globulin, Total: 2.8 g/dL (ref 1.5–4.5)
Glucose: 120 mg/dL — ABNORMAL HIGH (ref 65–99)
Potassium: 4.4 mmol/L (ref 3.5–5.2)
Sodium: 138 mmol/L (ref 134–144)
TOTAL PROTEIN: 7.1 g/dL (ref 6.0–8.5)

## 2018-05-05 LAB — ROCKY MTN SPOTTED FVR ABS PNL(IGG+IGM)
RMSF IGM: 0.42 {index} (ref 0.00–0.89)
RMSF IgG: POSITIVE — AB

## 2018-05-05 LAB — RMSF, IGG, IFA: RMSF, IGG, IFA: 1:64 {titer} — ABNORMAL HIGH

## 2018-05-05 LAB — LYME AB/WESTERN BLOT REFLEX

## 2018-05-06 ENCOUNTER — Telehealth: Payer: Self-pay | Admitting: Family Medicine

## 2018-05-06 NOTE — Telephone Encounter (Signed)
Aware  Labs

## 2018-05-10 ENCOUNTER — Ambulatory Visit: Payer: 59 | Admitting: Family Medicine

## 2018-05-11 ENCOUNTER — Encounter: Payer: Self-pay | Admitting: Gastroenterology

## 2018-05-24 ENCOUNTER — Ambulatory Visit (INDEPENDENT_AMBULATORY_CARE_PROVIDER_SITE_OTHER): Payer: Medicare Other | Admitting: *Deleted

## 2018-05-24 VITALS — BP 135/80 | HR 82 | Ht 71.0 in | Wt 272.8 lb

## 2018-05-24 DIAGNOSIS — Z0000 Encounter for general adult medical examination without abnormal findings: Secondary | ICD-10-CM

## 2018-05-24 NOTE — Patient Instructions (Addendum)
Keep Follow up appointment with Dr. Warrick Parisian Request copy of eye exam to be sent to office  Keep appointment with [facility]\   [name] , Thank you for taking time to come for your Medicare Wellness Visit. I appreciate your ongoing commitment to your health goals. Please review the following plan we discussed and let me know if I can assist you in the future.   These are the goals we discussed: Goals    . Weight (lb) < 200 lb (90.7 kg)     Eat 3 meals daily that consist of lean protein, fruits and vegetables Health Snacks between        This is a list of the screening recommended for you and due dates:  Health Maintenance  Topic Date Due  . Eye exam for diabetics  06/23/2018*  . Pneumonia vaccines (1 of 2 - PCV13) 05/25/2019*  . Flu Shot  07/14/2018  . Hemoglobin A1C  10/16/2018  . Complete foot exam   04/16/2019  . Colon Cancer Screening  10/14/2022  . Tetanus Vaccine  09/25/2024  .  Hepatitis C: One time screening is recommended by Center for Disease Control  (CDC) for  adults born from [birth year] through 1964.   Completed  *Topic was postponed. The date shown is not the original due date.    Exercising to Lose Weight Exercising can help you to lose weight. In order to lose weight through exercise, you need to do vigorous-intensity exercise. You can tell that you are exercising with vigorous intensity if you are breathing very hard and fast and cannot hold a conversation while exercising. Moderate-intensity exercise helps to maintain your current weight. You can tell that you are exercising at a moderate level if you have a higher heart rate and faster breathing, but you are still able to hold a conversation. How often should I exercise? Choose an activity that you enjoy and set realistic goals. Your health care provider can help you to make an activity plan that works for you. Exercise regularly as directed by your health care provider. This may include:  Doing  resistance training twice each week, such as: ? Push-ups. ? Sit-ups. ? Lifting weights. ? Using resistance bands.  Doing a given intensity of exercise for a given amount of time. Choose from these options: ? 150 minutes of moderate-intensity exercise every week. ? 75 minutes of vigorous-intensity exercise every week. ? A mix of moderate-intensity and vigorous-intensity exercise every week.  Children, pregnant women, people who are out of shape, people who are overweight, and older adults may need to consult a health care provider for individual recommendations. If you have any sort of medical condition, be sure to consult your health care provider before starting a new exercise program. What are some activities that can help me to lose weight?  Walking at a rate of at least 4.5 miles an hour.  Jogging or running at a rate of 5 miles per hour.  Biking at a rate of at least 10 miles per hour.  Lap swimming.  Roller-skating or in-line skating.  Cross-country skiing.  Vigorous competitive sports, such as football, basketball, and soccer.  Jumping rope.  Aerobic dancing. How can I be more active in my day-to-day activities?  Use the stairs instead of the elevator.  Take a walk during your lunch break.  If you drive, park your car farther away from work or school.  If you take public transportation, get off one stop early and walk the  rest of the way.  Make all of your phone calls while standing up and walking around.  Get up, stretch, and walk around every 30 minutes throughout the day. What guidelines should I follow while exercising?  Do not exercise so much that you hurt yourself, feel dizzy, or get very short of breath.  Consult your health care provider prior to starting a new exercise program.  Wear comfortable clothes and shoes with good support.  Drink plenty of water while you exercise to prevent dehydration or heat stroke. Body water is lost during exercise  and must be replaced.  Work out until you breathe faster and your heart beats faster. This information is not intended to replace advice given to you by your health care provider. Make sure you discuss any questions you have with your health care provider. Document Released: 01/02/2011 Document Revised: 05/07/2016 Document Reviewed: 05/03/2014 Elsevier Interactive Patient Education  2018 [place] Directive Advance directives are legal documents that let you make choices ahead of time about your health care and medical treatment in case you become unable to communicate for yourself. Advance directives are a way for you to communicate your wishes to family, friends, and health care providers. This can help convey your decisions about end-of-life care if you become unable to communicate. Discussing and writing advance directives should happen over time rather than all at once. Advance directives can be changed depending on your situation and what you want, even after you have signed the advance directives. If you do not have an advance directive, some states assign family decision makers to act on your behalf based on how closely you are related to them. Each state has its own laws regarding advance directives. You may want to check with your health care provider, attorney, or state representative about the laws in your state. There are different types of advance directives, such as:  Medical power of attorney.  Living will.  Do not resuscitate (DNR) or do not attempt resuscitation (DNAR) order.  Health care proxy and medical power of attorney A health care proxy, also called a health care agent, is a person who is appointed to make medical decisions for you in cases in which you are unable to make the decisions yourself. Generally, people choose someone they know well and trust to represent their preferences. Make sure to ask this person for an agreement to act as your proxy. A proxy  may have to exercise judgment in the event of a medical decision for which your wishes are not known. A medical power of attorney is a legal document that names your health care proxy. Depending on the laws in your state, after the document is written, it may also need to be:  Signed.  Notarized.  Dated.  Copied.  Witnessed.  Incorporated into your medical record.  You may also want to appoint someone to manage your financial affairs in a situation in which you are unable to do so. This is called a durable power of attorney for finances. It is a separate legal document from the durable power of attorney for health care. You may choose the same person or someone different from your health care proxy to act as your agent in financial matters. If you do not appoint a proxy, or if there is a concern that the proxy is not acting in your best interests, a court-appointed guardian may be designated to act on your behalf. Living will A living will is a set of  instructions documenting your wishes about medical care when you cannot express them yourself. Health care providers should keep a copy of your living will in your medical record. You may want to give a copy to family members or friends. To alert caregivers in case of an emergency, you can place a card in your wallet to let them know that you have a living will and where they can find it. A living will is used if you become:  Terminally ill.  Incapacitated.  Unable to communicate or make decisions.  Items to consider in your living will include:  The use or non-use of life-sustaining equipment, such as dialysis machines and breathing machines (ventilators).  A DNR or DNAR order, which is the instruction not to use cardiopulmonary resuscitation (CPR) if breathing or heartbeat stops.  The use or non-use of tube feeding.  Withholding of food and fluids.  Comfort (palliative) care when the goal becomes comfort rather than a  cure.  Organ and tissue donation.  A living will does not give instructions for distributing your money and property if you should pass away. It is recommended that you seek the advice of a lawyer when writing a will. Decisions about taxes, beneficiaries, and asset distribution will be legally binding. This process can relieve your family and friends of any concerns surrounding disputes or questions that may come up about the distribution of your assets. DNR or DNAR A DNR or DNAR order is a request not to have CPR in the event that your heart stops beating or you stop breathing. If a DNR or DNAR order has not been made and shared, a health care provider will try to help any patient whose heart has stopped or who has stopped breathing. If you plan to have surgery, talk with your health care provider about how your DNR or DNAR order will be followed if problems occur. Summary  Advance directives are the legal documents that allow you to make choices ahead of time about your health care and medical treatment in case you become unable to communicate for yourself.  The process of discussing and writing advance directives should happen over time. You can change the advance directives, even after you have signed them.  Advance directives include DNR or DNAR orders, living wills, and designating an agent as your medical power of attorney. This information is not intended to replace advice given to you by your health care provider. Make sure you discuss any questions you have with your health care provider. Document Released: 03/08/2008 Document Revised: 10/19/2016 Document Reviewed: 10/19/2016 Elsevier Interactive Patient Education  2017 [employer].

## 2018-05-24 NOTE — Progress Notes (Addendum)
Subjective:   [name] is a 67 y.o. male who presents for an Initial Medicare Annual Wellness Visit. [name] is retired from [employer] of 13 years.  He lives at home with his 3 dogs, 4 cats and 5 goats.  [name] enjoys gardening, feeding animals, reading and walking.  He walks about 2 miles every morning with dogs.   He eats 3 healthy meals and healthy snacks daily.  [name] has had no falls, hospitalizations or surgeries in the past year and overall feels that his health is the same as it was a year ago.  Objective:    Today's Vitals   05/24/18 0819  BP: 135/80  Pulse: 82  Weight: 272 lb 12.8 oz (123.7 kg)  Height: 5\' 11"  (1.803 m)   Body mass index is 38.05 kg/m.  No flowsheet data found.  Current Medications (verified) Outpatient Encounter Medications as of 05/24/2018  Medication Sig  . aspirin 81 MG chewable tablet Chew 81 mg by mouth daily.  Marland Kitchen glucose blood test strip Use to check blood glucose once daily at varying times.  Dispense Verio One Touch Test strips  . irbesartan (AVAPRO) 150 MG tablet TAKE ONE TABLET BY MOUTH EVERY DAY  . metFORMIN (GLUCOPHAGE) 1000 MG tablet Take 1 tablet (1,000 mg total) by mouth 2 (two) times daily with a meal.  . Multiple Vitamins-Minerals (CENTRUM SILVER 50+MEN PO) Take 1 tablet by mouth daily.  [name] DELICA LANCETS 50K MISC Use to check blood glucose once daily at varying times  . [DISCONTINUED] doxycycline (VIBRA-TABS) 100 MG tablet Take 1 tablet (100 mg total) by mouth 2 (two) times daily. 1 po bid  . [DISCONTINUED] metroNIDAZOLE (FLAGYL) 500 MG tablet Take 1 tablet (500 mg total) by mouth 3 (three) times daily.   No facility-administered encounter medications on file as of 05/24/2018.     Allergies (verified) Ramipril   History: Past Medical History:  Diagnosis Date  . Diabetes mellitus without complication ([place])   . GERD (gastroesophageal reflux disease)   . Hx of Rocky Mountain spotted fever  1-1-12011  . Hypertension   . Sleep apnea    wears cpap    Past Surgical History:  Procedure Laterality Date  . KNEE SURGERY Left 10/2014  . KNEE SURGERY Right 09/2015   Family History  Problem Relation Age of Onset  . Heart disease Mother   . Congestive Heart Failure Mother   . Diabetes Father   . Aneurysm Sister   . Diabetes Paternal Uncle   . Diabetes Paternal Grandmother    Social History   Socioeconomic History  . Marital status: Single    Spouse name: Not on file  . Number of children: Not on file  . Years of education: Not on file  . Highest education level: 12th grade  Occupational History  . Occupation: Retired  Scientific laboratory technician  . Financial resource strain: Not hard at all  . Food insecurity:    Worry: Never true    Inability: Never true  . Transportation needs:    Medical: No    Non-medical: No  Tobacco Use  . Smoking status: Never Smoker  . Smokeless tobacco: Never Used  Substance and Sexual Activity  . Alcohol use: No  . Drug use: No  . Sexual activity: Not Currently  Lifestyle  . Physical activity:    Days per week: 7 days    Minutes per session: 50 min  . Stress: Not at all  Relationships  .  Social connections:    Talks on phone: More than three times a week    Gets together: More than three times a week    Attends religious service: More than 4 times per year    Active member of club or organization: Yes    Attends meetings of clubs or organizations: More than 4 times per year    Relationship status: Never married  Other Topics Concern  . Not on file  Social History Narrative  . Not on file   Tobacco Counseling No tobacco use Clinical Intake:  Pre-visit preparation completed: No  Pain : No/denies pain     Nutritional Status: BMI > 30  Obese Nutritional Risks: None Diabetes: Yes CBG done?: No Did pt. bring in CBG monitor from home?: No  How often do you need to have someone help you when you read instructions, pamphlets, or  other written materials from your doctor or pharmacy?: 1 - Never What is the last grade level you completed in school?: 12th Grade  Interpreter Needed?: No  Information entered by :: Truett Mainland, LPN  Activities of Daily Living In your present state of health, do you have any difficulty performing the following activities: 05/24/2018  Hearing? N  Vision? N  Difficulty concentrating or making decisions? N  Walking or climbing stairs? N  Dressing or bathing? N  Doing errands, shopping? N  Some recent data might be hidden  No Trouble with ADLs   Immunizations and Health Maintenance Immunization History  Administered Date(s) Administered  . Influenza,inj,Quad PF,6+ Mos 09/25/2014, 10/22/2016, 09/22/2017  . Tdap 09/25/2014  Declines pneumonia vaccine and shingrix vaccine at this time There are no preventive care reminders to display for this patient.  Patient Care Team: Dettinger, Fransisca Kaufmann, MD as PCP - General (Family Medicine) Danie Binder, MD as Consulting Physician (Gastroenterology)  Indicate any recent Medical Services you may have received from other than [facility] providers in the past year (date may be approximate).    Assessment:   This is a routine wellness examination for [place]certain.  Hearing/Vision screen No exam data present  Dietary issues and exercise activities discussed: Current Exercise Habits: Home exercise routine, Type of exercise: walking, Time (Minutes): 45, Frequency (Times/Week): 7, Weekly Exercise (Minutes/Week): 315, Intensity: Mild  Goals    . Weight (lb) < 200 lb (90.7 kg)     Eat 3 meals daily that consist of lean protein, fruits and vegetables Health Snacks between       Depression Screen PHQ 2/9 Scores 05/24/2018 05/03/2018 04/15/2018 12/23/2017  PHQ - 2 Score 0 0 0 0  PHQ- 9 Score - - - -    Fall Risk Fall Risk  05/24/2018 05/03/2018 04/15/2018 12/23/2017 09/22/2017  Falls in the past year? No No No No Yes  Number falls in past yr: - - - - 1    Injury with Fall? - - - - No    Is the patient's home free of loose throw rugs in walkways, pet beds, electrical cords, etc?   yes      Grab bars in the bathroom? yes      Handrails on the stairs?   yes      Adequate lighting?   yes  Timed Get Up and Go performed:   Cognitive Function: MMSE - Mini Mental State Exam 05/24/2018  Orientation to time 5  Orientation to Place 5  Registration 3  Attention/ Calculation 5  Recall 3  Language- name 2 objects 2  Language-  repeat 1  Language- follow 3 step command 3  Language- read & follow direction 1  Write a sentence 1  Copy design 1  Total score 30  No memory loss noted at this time      Screening Tests Health Maintenance  Topic Date Due  . OPHTHALMOLOGY EXAM  06/23/2018 (Originally 03/16/1961)  . PNA vac Low Risk Adult (1 of 2 - PCV13) 05/25/2019 (Originally 03/16/2017)  . INFLUENZA VACCINE  07/14/2018  . HEMOGLOBIN A1C  10/16/2018  . FOOT EXAM  04/16/2019  . COLONOSCOPY  10/14/2022  . TETANUS/TDAP  09/25/2024  . Hepatitis C Screening  Completed  Ophthalmology exam schedule for 06/23/18 at [facility] [address] in [place] Declines pneumonia and shingles vaccine at this time  Qualifies for Shingles Vaccine? Yes, Declines at this time  Cancer Screenings: Lung: Low Dose CT Chest recommended if Age 11-80 years, 30 pack-year currently smoking OR have quit w/in 15years. Patient does not qualify. Colorectal: yes, up to date  Additional Screenings:  Hepatitis C Screening: Done 03/18/17      Plan:    Encouraged to continue to exercise for at least 30 minutes, 3 times weekly.  Encouraged to continue to eat 3 healthy meals a day and to increase protein. Encouraged to keep appointment for eye exam in July and to request that a copy to be sent to this office. Encouraged to keep appointment with [facility] in July and to keep follow up appointment with Dr. Warrick Parisian.  Advance directive information given and requested to have signed copy to be  returned to office when complete.  Explained the importance of shingrix and prevnar vaccine will discuss with Dr. Warrick Parisian at next appointment.   I have personally reviewed and noted the following in the patient's chart:   . Medical and social history . Use of alcohol, tobacco or illicit drugs  . Current medications and supplements . Functional ability and status . Nutritional status . Physical activity . Advanced directives . List of other physicians . Hospitalizations, surgeries, and ER visits in previous 12 months . Vitals . Screenings to include cognitive, depression, and falls . Referrals and appointments  In addition, I have reviewed and discussed with patient certain preventive protocols, quality metrics, and best practice recommendations. A written personalized care plan for preventive services as well as general preventive health recommendations were provided to patient.     Wardell Heath, LPN   2/33/0076      I have reviewed and agree with the above AWV documentation.   Evelina Dun, FNP

## 2018-06-14 ENCOUNTER — Ambulatory Visit (INDEPENDENT_AMBULATORY_CARE_PROVIDER_SITE_OTHER): Payer: Medicare Other | Admitting: Urology

## 2018-06-14 DIAGNOSIS — R972 Elevated prostate specific antigen [PSA]: Secondary | ICD-10-CM | POA: Diagnosis not present

## 2018-06-14 DIAGNOSIS — N401 Enlarged prostate with lower urinary tract symptoms: Secondary | ICD-10-CM | POA: Diagnosis not present

## 2018-06-14 DIAGNOSIS — R3912 Poor urinary stream: Secondary | ICD-10-CM

## 2018-06-21 DIAGNOSIS — H5212 Myopia, left eye: Secondary | ICD-10-CM | POA: Diagnosis not present

## 2018-06-21 DIAGNOSIS — H524 Presbyopia: Secondary | ICD-10-CM | POA: Diagnosis not present

## 2018-06-21 DIAGNOSIS — H52223 Regular astigmatism, bilateral: Secondary | ICD-10-CM | POA: Diagnosis not present

## 2018-06-21 DIAGNOSIS — H5201 Hypermetropia, right eye: Secondary | ICD-10-CM | POA: Diagnosis not present

## 2018-08-03 ENCOUNTER — Ambulatory Visit: Payer: 59 | Admitting: Family Medicine

## 2018-08-09 ENCOUNTER — Other Ambulatory Visit: Payer: Self-pay | Admitting: Family Medicine

## 2018-08-11 ENCOUNTER — Ambulatory Visit: Payer: Medicare Other | Admitting: Gastroenterology

## 2018-08-25 ENCOUNTER — Encounter: Payer: Self-pay | Admitting: Gastroenterology

## 2018-08-25 ENCOUNTER — Ambulatory Visit (INDEPENDENT_AMBULATORY_CARE_PROVIDER_SITE_OTHER): Payer: Medicare Other | Admitting: Gastroenterology

## 2018-08-25 DIAGNOSIS — Z8719 Personal history of other diseases of the digestive system: Secondary | ICD-10-CM | POA: Diagnosis not present

## 2018-08-25 NOTE — Progress Notes (Signed)
Primary Care Physician:  Dettinger, Fransisca Kaufmann, MD Primary Gastroenterologist:  Dr. Oneida Alar   Chief Complaint  Patient presents with  . Rectal Bleeding    last episode was about May. no problems since  . Diarrhea    HPI:   [name] is a 67 y.o. male presenting today at the request of Dr. Warrick Parisian secondary to rectal bleeding.    In May was having abdominal cramping. Every 6-7 months will have discomfort. Wondered if maybe diverticulitis and treated empirically. Had intermittent rectal bleeding and noted some blood in his underwear. Had a lot of rectal itching. Notes a history of chronic looser/softer stools. Ate [employer] yesterday and had urgency to go. Will sometimes have bouts of loose stool but then go back to [facility] stool scale #4. Sometimes has abdominal discomfort (August 2018 and May 2019). Quit drinking milk about a year ago. Uses half and half for coffee. Notes things that gives him diarrhea are cheesecake, [employer] birthday cake, fillet of beef, ice berg lettuce tears him up. Started metformin about a year ago but noted more diarrhea over the last year.   Rare reflux. Doesn't have to take a chronic PPI. Oregano flared up symptoms.   Last colonoscopy a few years ago by Dr. Britta Mccreedy. No polyps.   Past Medical History:  Diagnosis Date  . Diabetes mellitus without complication ([place])   . GERD (gastroesophageal reflux disease)   . Hx of Rocky Mountain spotted fever 1-1-12011  . Hypertension   . Sleep apnea    wears cpap     Past Surgical History:  Procedure Laterality Date  . KNEE SURGERY Left 10/2014  . KNEE SURGERY Right 09/2015    Current Outpatient Medications  Medication Sig Dispense Refill  . aspirin 81 MG chewable tablet Chew 81 mg by mouth daily.    Marland Kitchen glucose blood test strip Use to check blood glucose once daily at varying times.  Dispense Verio One Touch Test strips 100 each 4  . irbesartan (AVAPRO) 150 MG tablet TAKE ONE TABLET BY MOUTH  EVERY DAY 90 tablet 1  . metFORMIN (GLUCOPHAGE) 1000 MG tablet Take 1 tablet (1,000 mg total) by mouth 2 (two) times daily with a meal. 180 tablet 3  . Multiple Vitamins-Minerals (CENTRUM SILVER 50+MEN PO) Take 1 tablet by mouth. Every now and then    . ONETOUCH DELICA LANCETS 32K MISC Use to check blood glucose once daily at varying times 100 each 4   No current facility-administered medications for this visit.     Allergies as of 08/25/2018 - Review Complete 08/25/2018  Allergen Reaction Noted  . Ramipril Cough 11/11/2016    Family History  Problem Relation Age of Onset  . Heart disease Mother   . Congestive Heart Failure Mother   . Diabetes Father   . Aneurysm Sister   . Diabetes Paternal Uncle   . Diabetes Paternal Grandmother     Social History   Socioeconomic History  . Marital status: Single    Spouse name: Not on file  . Number of children: Not on file  . Years of education: Not on file  . Highest education level: 12th grade  Occupational History  . Occupation: Retired  Scientific laboratory technician  . Financial resource strain: Not hard at all  . Food insecurity:    Worry: Never true    Inability: Never true  . Transportation needs:    Medical: No    Non-medical: No  Tobacco Use  . Smoking status:  Never Smoker  . Smokeless tobacco: Never Used  Substance and Sexual Activity  . Alcohol use: No  . Drug use: No  . Sexual activity: Not Currently  Lifestyle  . Physical activity:    Days per week: 7 days    Minutes per session: 50 min  . Stress: Not at all  Relationships  . Social connections:    Talks on phone: More than three times a week    Gets together: More than three times a week    Attends religious service: More than 4 times per year    Active member of club or organization: Yes    Attends meetings of clubs or organizations: More than 4 times per year    Relationship status: Never married  . Intimate partner violence:    Fear of current or ex partner: No     Emotionally abused: No    Physically abused: No    Forced sexual activity: No  Other Topics Concern  . Not on file  Social History Narrative  . Not on file    Review of Systems: Gen: Denies any fever, chills, fatigue, weight loss, lack of appetite.  CV: Denies chest pain, heart palpitations, peripheral edema, syncope.  Resp: Denies shortness of breath at rest or with exertion. Denies wheezing or cough.  GI: see HPI  GU : Denies urinary burning, urinary frequency, urinary hesitancy MS: Denies joint pain, muscle weakness, cramps, or limitation of movement.  Derm: Denies rash, itching, dry skin Psych: Denies depression, anxiety, memory loss, and confusion Heme: Denies bruising, bleeding, and enlarged lymph nodes.  Physical Exam: BP (!) 153/86   Pulse 64   Temp (!) 97 F (36.1 C) (Oral)   Ht 5\' 11"  (1.803 m)   Wt 279 lb (126.6 kg)   BMI 38.91 kg/m  General:   Alert and oriented. Pleasant and cooperative. Well-nourished and well-developed.  Head:  Normocephalic and atraumatic. Eyes:  Without icterus, sclera clear and conjunctiva pink.  Ears:  Normal auditory acuity. Nose:  No deformity, discharge,  or lesions. Mouth:  No deformity or lesions, oral mucosa pink.  Lungs:  Clear to auscultation bilaterally. No wheezes, rales, or rhonchi. No distress.  Heart:  S1, S2 present without murmurs appreciated.  Abdomen:  +BS, soft, non-tender and non-distended. No HSM noted. No guarding or rebound. No masses appreciated. UMBILICAL HERNIA  Rectal:  Deferred  Msk:  Symmetrical without gross deformities. Normal posture. Extremities:  Without edema. Neurologic:  Alert and  oriented x4 Psych:  Alert and cooperative. Normal mood and affect.  Lab Results  Component Value Date   WBC 5.7 05/03/2018   HGB 17.0 05/03/2018   HCT 47.8 05/03/2018   MCV 95 05/03/2018   PLT 227 05/03/2018

## 2018-08-25 NOTE — Patient Instructions (Addendum)
Please call if you have any further rectal bleeding.  We are requesting the colonoscopy report from Dr. Britta Mccreedy. If you have any further rectal bleeding, I would recommend a colonoscopy.  Call us if any abdominal pain, fever, chills.  I would recommend avoiding the foods we discussed that are likely "triggers".   Also, metformin may be causing looser stools. Please talk with your doctor about potential other therapies that may be helpful to try.  It was a pleasure to see you today. I strive to create trusting relationships with patients to provide genuine, compassionate, and quality care. I value your feedback. If you receive a survey regarding your visit,  I greatly appreciate you taking time to fill this out.   Annitta Needs, PhD, ANP-BC [facility]

## 2018-08-26 ENCOUNTER — Other Ambulatory Visit: Payer: Self-pay | Admitting: Family Medicine

## 2018-08-26 MED ORDER — IRBESARTAN 150 MG PO TABS: 150.0000 mg | ORAL_TABLET | Freq: Every day | ORAL | 0 refills | Status: DC

## 2018-08-26 NOTE — Telephone Encounter (Signed)
Pt aware refill sent to pharmacy and why system automatically denied refill He will call the billing department back.

## 2018-08-26 NOTE — Telephone Encounter (Signed)
What is the name of the medication? irbesartan (AVAPRO) 150 MG tablet  Have you contacted your pharmacy to request a refill? yes  Which pharmacy would you like this sent to? [facility]   Patient notified that their request is being sent to the clinical staff for review and that they should receive a call once it is complete. If they do not receive a call within 24 hours they can check with their pharmacy or our office.

## 2018-09-04 NOTE — Assessment & Plan Note (Signed)
67 year old male with bout of rectal bleeding earlier this year, now resolved. Also notes intermittent looser stool and abdominal cramping that appears to be dietary-related. Last colonoscopy by Dr. Britta Mccreedy a few years ago, which we have requested. Would pursue colonoscopy if any further rectal bleeding. Will obtain records and decide on timing of next colonoscopy. Avoid trigger foods. Further recommendations to follow.

## 2018-09-05 NOTE — Progress Notes (Signed)
cc'd to pcp 

## 2018-10-17 ENCOUNTER — Telehealth: Payer: Self-pay | Admitting: Gastroenterology

## 2018-10-17 NOTE — Telephone Encounter (Signed)
LMOM to call.

## 2018-10-17 NOTE — Telephone Encounter (Signed)
Outside records received, dated Oct 2013.   Diverticulosis. Descending colon polyp (adenomatous) and rectal polyp (hyperplastic), along with a "mass" in the anal canal, does not appear to have been biopsied. Recommended surgical consultation.    Has he had any further bleeding? Recommend Colonoscopy with Propofol by Dr. Oneida Alar. Can we get him in for an appt and tentatively have him scheduled in near future if he is willing?

## 2018-10-17 NOTE — Progress Notes (Signed)
Outside records received, dated Oct 2013.   Diverticulosis. Descending colon polyp (adenomatous) and rectal polyp (hyperplastic), along with a "mass" in the anal canal, does not appear to have been biopsied. Recommended surgical consultation. It is unclear if this was done or not. Recommending TCS with Propofol.

## 2018-10-18 NOTE — Telephone Encounter (Signed)
LMOM to call.

## 2018-10-18 NOTE — Telephone Encounter (Signed)
Corrected letter mailed.

## 2018-10-18 NOTE — Telephone Encounter (Signed)
Mailing a letter for pt to call.

## 2018-10-20 NOTE — Telephone Encounter (Signed)
Pt called and was informed. He said he has not had any more bleeding. OK to schedule an OV appt and plan on colonoscopy. Forwarding to [facility] to schedule OV apt and to [facility] to have tentative appt for colonoscopy.

## 2018-10-24 NOTE — Telephone Encounter (Signed)
PATIENT SCHEDULED  °

## 2018-10-24 NOTE — Telephone Encounter (Signed)
OV scheduled for 01/23/19 at 11:00am. Holding spot for TCS w/Propofol w/SLF 02/07/19. Tried to call pt, no answer, LMOVM. Letter also mailed.

## 2018-11-07 ENCOUNTER — Other Ambulatory Visit: Payer: Self-pay | Admitting: Family Medicine

## 2018-11-18 ENCOUNTER — Other Ambulatory Visit: Payer: Self-pay | Admitting: Family Medicine

## 2018-11-25 ENCOUNTER — Telehealth: Payer: Self-pay | Admitting: *Deleted

## 2018-11-25 MED ORDER — IRBESARTAN 150 MG PO TABS: 150.0000 mg | ORAL_TABLET | Freq: Every day | ORAL | 0 refills | Status: DC

## 2018-11-25 NOTE — Telephone Encounter (Signed)
Pt talked to the billing office about his account OV made for 12/22/18 30 day refill sent to [place]

## 2018-12-19 ENCOUNTER — Other Ambulatory Visit: Payer: Self-pay | Admitting: Family Medicine

## 2018-12-22 ENCOUNTER — Encounter: Payer: Self-pay | Admitting: Family Medicine

## 2018-12-22 ENCOUNTER — Ambulatory Visit (INDEPENDENT_AMBULATORY_CARE_PROVIDER_SITE_OTHER): Payer: Medicare Other | Admitting: Family Medicine

## 2018-12-22 VITALS — BP 141/82 | HR 62 | Temp 97.5°F | Ht 71.0 in | Wt 277.4 lb

## 2018-12-22 DIAGNOSIS — E1169 Type 2 diabetes mellitus with other specified complication: Secondary | ICD-10-CM | POA: Diagnosis not present

## 2018-12-22 DIAGNOSIS — E669 Obesity, unspecified: Secondary | ICD-10-CM | POA: Diagnosis not present

## 2018-12-22 DIAGNOSIS — R972 Elevated prostate specific antigen [PSA]: Secondary | ICD-10-CM

## 2018-12-22 DIAGNOSIS — E1159 Type 2 diabetes mellitus with other circulatory complications: Secondary | ICD-10-CM | POA: Diagnosis not present

## 2018-12-22 DIAGNOSIS — I10 Essential (primary) hypertension: Secondary | ICD-10-CM | POA: Diagnosis not present

## 2018-12-22 DIAGNOSIS — I152 Hypertension secondary to endocrine disorders: Secondary | ICD-10-CM

## 2018-12-22 MED ORDER — HYDROXYZINE HCL 50 MG PO TABS
50.0000 mg | ORAL_TABLET | Freq: Every evening | ORAL | 3 refills | Status: DC | PRN
Start: 2018-12-22 — End: 2019-08-24

## 2018-12-22 MED ORDER — METFORMIN HCL ER 500 MG PO TB24: 500.0000 mg | ORAL_TABLET | Freq: Every day | ORAL | 3 refills | Status: DC

## 2018-12-22 NOTE — Progress Notes (Signed)
BP (!) 141/82   Pulse 62   Temp (!) 97.5 F (36.4 C) (Oral)   Ht _0  (1.803 m)   Wt 277 lb 6.4 oz (125.8 kg)   BMI 38.69 kg/m    Subjective:    Patient ID: [name], male    DOB: 02-12-1951, 68 y.o.   MRN: [id number]  HPI: [name] is a 68 y.o. male presenting on 12/22/2018 for Hypertension (6 month ); Diabetes; Medical Management of Chronic Issues; and Insomnia   HPI Type 2 diabetes mellitus Patient comes in today for recheck of his diabetes. Patient has been currently taking metformin. Patient is currently on an ACE inhibitor/ARB. Patient has not seen an ophthalmologist this year. Patient denies any issues with their feet.   Hypertension Patient is currently on a irbesartan, and their blood pressure today is 141/82. Patient denies any lightheadedness or dizziness. Patient denies headaches, blurred vision, chest pains, shortness of breath, or weakness. Denies any side effects from medication and is content with current medication.   Patient had elevated PSA previously and will do a recheck today.  He denies any major urinary symptoms today.  Relevant past medical, surgical, family and social history reviewed and updated as indicated. Interim medical history since our last visit reviewed. Allergies and medications reviewed and updated.  Review of Systems  Constitutional: Negative for chills and fever.  Eyes: Negative for discharge.  Respiratory: Negative for shortness of breath and wheezing.   Cardiovascular: Negative for chest pain and leg swelling.  Genitourinary: Negative for decreased urine volume, difficulty urinating, dysuria and frequency.  Musculoskeletal: Negative for back pain and gait problem.  Skin: Negative for rash.  Neurological: Negative for dizziness, weakness and light-headedness.  All other systems reviewed and are negative.   Per HPI unless specifically indicated above   Allergies as of 12/22/2018      Reactions   Ramipril Cough      Medication List       Accurate as of December 22, 2018  3:53 PM. Always use your most recent med list.        aspirin 81 MG chewable tablet Chew 81 mg by mouth daily.   CENTRUM SILVER 50+MEN PO Take 1 tablet by mouth. Every now and then   glucose blood test strip Use to check blood glucose once daily at varying times.  Dispense Verio One Touch Test strips   irbesartan 150 MG tablet Commonly known as:  AVAPRO TAKE 1 TABLET BY MOUTH EVERY DAY   metFORMIN 1000 MG tablet Commonly known as:  GLUCOPHAGE Take 1 tablet (1,000 mg total) by mouth 2 (two) times daily with a meal.   ONETOUCH DELICA LANCETS 03K Misc Use to check blood glucose once daily at varying times          Objective:    BP (!) 141/82   Pulse 62   Temp (!) 97.5 F (36.4 C) (Oral)   Ht _1  (1.803 m)   Wt 277 lb 6.4 oz (125.8 kg)   BMI 38.69 kg/m   Wt Readings from Last 3 Encounters:  12/22/18 277 lb 6.4 oz (125.8 kg)  08/25/18 279 lb (126.6 kg)  05/24/18 272 lb 12.8 oz (123.7 kg)    Physical Exam Vitals signs and nursing note reviewed.  Constitutional:      General: He is not in acute distress.    Appearance: He is well-developed. He is not diaphoretic.  Eyes:     General: No scleral icterus.  Conjunctiva/sclera: Conjunctivae normal.  Neck:     Musculoskeletal: Neck supple.     Thyroid: No thyromegaly.  Cardiovascular:     Rate and Rhythm: Normal rate and regular rhythm.     Heart sounds: Normal heart sounds. No murmur.  Pulmonary:     Effort: Pulmonary effort is normal. No respiratory distress.     Breath sounds: Normal breath sounds. No wheezing.  Musculoskeletal: Normal range of motion.  Lymphadenopathy:     Cervical: No cervical adenopathy.  Skin:    General: Skin is warm and dry.     Findings: No rash.  Neurological:     Mental Status: He is alert and oriented to person, place, and time.     Coordination: Coordination normal.  Psychiatric:        Behavior: Behavior normal.        Assessment & Plan:   Problem List Items Addressed This Visit      Cardiovascular and Mediastinum   Hypertension associated with diabetes ([place])   Relevant Medications   metFORMIN (GLUCOPHAGE XR) 500 MG 24 hr tablet   Other Relevant Orders   CMP14+EGFR (Completed)   Lipid panel (Completed)     Endocrine   Diabetes mellitus type 2 in obese (HCC) - Primary   Relevant Medications   metFORMIN (GLUCOPHAGE XR) 500 MG 24 hr tablet   Other Relevant Orders   CBC with Differential/Platelet (Completed)   CMP14+EGFR (Completed)   Bayer DCA Hb A1c Waived (Completed)   Lipid panel (Completed)     Other   Elevated PSA   Relevant Orders   PSA, total and free (Completed)    Continue current medications and will check labs.  Follow up plan: Return in about 3 months (around 03/23/2019), or if symptoms worsen or fail to improve.  Counseling provided for all of the vaccine components No orders of the defined types were placed in this encounter.   Caryl Pina, MD [facility] 12/22/2018, 3:53 PM

## 2018-12-23 DIAGNOSIS — E669 Obesity, unspecified: Secondary | ICD-10-CM | POA: Diagnosis not present

## 2018-12-23 DIAGNOSIS — I10 Essential (primary) hypertension: Secondary | ICD-10-CM | POA: Diagnosis not present

## 2018-12-23 DIAGNOSIS — R972 Elevated prostate specific antigen [PSA]: Secondary | ICD-10-CM | POA: Diagnosis not present

## 2018-12-23 DIAGNOSIS — E1169 Type 2 diabetes mellitus with other specified complication: Secondary | ICD-10-CM | POA: Diagnosis not present

## 2018-12-23 DIAGNOSIS — E1159 Type 2 diabetes mellitus with other circulatory complications: Secondary | ICD-10-CM | POA: Diagnosis not present

## 2018-12-23 LAB — BAYER DCA HB A1C WAIVED: HB A1C (BAYER DCA - WAIVED): 6.9 % (ref <7.0)

## 2018-12-24 LAB — CMP14+EGFR
A/G RATIO: 2.2 (ref 1.2–2.2)
ALT: 30 IU/L (ref 0–44)
AST: 27 IU/L (ref 0–40)
Albumin: 4.1 g/dL (ref 3.6–4.8)
Alkaline Phosphatase: 73 IU/L (ref 39–117)
BUN / CREAT RATIO: 11 (ref 10–24)
BUN: 13 mg/dL (ref 8–27)
Bilirubin Total: 1.1 mg/dL (ref 0.0–1.2)
CALCIUM: 9.2 mg/dL (ref 8.6–10.2)
CO2: 25 mmol/L (ref 20–29)
Chloride: 98 mmol/L (ref 96–106)
Creatinine, Ser: 1.16 mg/dL (ref 0.76–1.27)
GFR, EST AFRICAN AMERICAN: 75 mL/min/{1.73_m2} (ref >59)
GFR, EST NON AFRICAN AMERICAN: 65 mL/min/{1.73_m2} (ref >59)
GLOBULIN, TOTAL: 1.9 g/dL (ref 1.5–4.5)
Glucose: 146 mg/dL — ABNORMAL HIGH (ref 65–99)
POTASSIUM: 4.2 mmol/L (ref 3.5–5.2)
SODIUM: 138 mmol/L (ref 134–144)
TOTAL PROTEIN: 6 g/dL (ref 6.0–8.5)

## 2018-12-24 LAB — LIPID PANEL
CHOL/HDL RATIO: 4 ratio (ref 0.0–5.0)
Cholesterol, Total: 136 mg/dL (ref 100–199)
HDL: 34 mg/dL — ABNORMAL LOW (ref >39)
LDL CALC: 76 mg/dL (ref 0–99)
Triglycerides: 130 mg/dL (ref 0–149)
VLDL Cholesterol Cal: 26 mg/dL (ref 5–40)

## 2018-12-24 LAB — CBC WITH DIFFERENTIAL/PLATELET
BASOS: 1 %
Basophils Absolute: 0.1 10*3/uL (ref 0.0–0.2)
EOS (ABSOLUTE): 0.1 10*3/uL (ref 0.0–0.4)
EOS: 2 %
HEMATOCRIT: 53.3 % — AB (ref 37.5–51.0)
Hemoglobin: 18.5 g/dL — ABNORMAL HIGH (ref 13.0–17.7)
IMMATURE GRANS (ABS): 0 10*3/uL (ref 0.0–0.1)
IMMATURE GRANULOCYTES: 0 %
LYMPHS: 23 %
Lymphocytes Absolute: 1.4 10*3/uL (ref 0.7–3.1)
MCH: 33 pg (ref 26.6–33.0)
MCHC: 34.7 g/dL (ref 31.5–35.7)
MCV: 95 fL (ref 79–97)
MONOS ABS: 0.5 10*3/uL (ref 0.1–0.9)
Monocytes: 9 %
NEUTROS ABS: 3.7 10*3/uL (ref 1.4–7.0)
NEUTROS PCT: 65 %
Platelets: 246 10*3/uL (ref 150–450)
RBC: 5.61 x10E6/uL (ref 4.14–5.80)
RDW: 12.6 % (ref 11.6–15.4)
WBC: 5.8 10*3/uL (ref 3.4–10.8)

## 2018-12-24 LAB — PSA, TOTAL AND FREE
PROSTATE SPECIFIC AG, SERUM: 7.5 ng/mL — AB (ref 0.0–4.0)
PSA, Free Pct: 22 %
PSA, Free: 1.65 ng/mL

## 2018-12-30 ENCOUNTER — Encounter: Payer: Self-pay | Admitting: Family Medicine

## 2019-01-04 ENCOUNTER — Telehealth: Payer: Self-pay | Admitting: *Deleted

## 2019-01-04 NOTE — Telephone Encounter (Signed)
LMOVM pt needs to make appt w/ Dr. Warrick Parisian for sleep apnea documentation

## 2019-01-09 ENCOUNTER — Other Ambulatory Visit: Payer: Self-pay | Admitting: Family Medicine

## 2019-01-16 ENCOUNTER — Encounter: Payer: Self-pay | Admitting: Family Medicine

## 2019-01-16 DIAGNOSIS — D582 Other hemoglobinopathies: Secondary | ICD-10-CM

## 2019-01-23 ENCOUNTER — Other Ambulatory Visit: Payer: Self-pay | Admitting: *Deleted

## 2019-01-23 ENCOUNTER — Encounter: Payer: Self-pay | Admitting: *Deleted

## 2019-01-23 ENCOUNTER — Ambulatory Visit (INDEPENDENT_AMBULATORY_CARE_PROVIDER_SITE_OTHER): Payer: Medicare Other | Admitting: Gastroenterology

## 2019-01-23 ENCOUNTER — Telehealth: Payer: Self-pay | Admitting: *Deleted

## 2019-01-23 ENCOUNTER — Encounter: Payer: Self-pay | Admitting: Gastroenterology

## 2019-01-23 DIAGNOSIS — Z860100 Personal history of colon polyps, unspecified: Secondary | ICD-10-CM | POA: Insufficient documentation

## 2019-01-23 MED ORDER — NA SULFATE-K SULFATE-MG SULF 17.5-3.13-1.6 GM/177ML PO SOLN: 1.0000 | ORAL | 0 refills | Status: DC

## 2019-01-23 NOTE — Progress Notes (Signed)
cc'd to pcp 

## 2019-01-23 NOTE — Patient Instructions (Signed)
We have arranged a colonoscopy with Dr. Oneida Alar in the near future.  Please do not take metformin the day of the procedure.  Further recommendations to follow!  I enjoyed seeing you again today! As you know, I value our relationship and want to provide genuine, compassionate, and quality care. I welcome your feedback. If you receive a survey regarding your visit,  I greatly appreciate you taking time to fill this out. See you next time!  Annitta Needs, PhD, ANP-BC [facility]

## 2019-01-23 NOTE — Progress Notes (Addendum)
REVIEWED. Pt denies history of colonpolyps  Referring Provider: Dettinger, Fransisca Kaufmann, MD Primary Care Physician:  Dettinger, Fransisca Kaufmann, MD  Primary GI: Dr. Oneida Alar   Chief Complaint  Patient presents with  . Rectal Bleeding    has been ok    HPI:   [name] is a 68 y.o. male presenting today with a history of rectal bleeding intermittently, with last colonoscopy in Oct 2013 at outside facility with diverticulosis. Descending colon polyp (adenomatous) and rectal polyp (hyperplastic), along with a "mass" in the anal canal, does not appear to have been biopsied. Recommended surgical consultation. It is unclear if this was done or not. He is here to arrange surveillance colonoscopy with Propofol. A date has already been saved for 02/07/2019 with Dr. Oneida Alar.   He states he saw Dr. Anthony Sar who told him the mass was a "hemorrhoid". No further rectal bleeding. No abdominal pain. Elevated H/H, will be seeing Dr. Walden Field with Hematology tomorrow, 01/24/2019.   No abdominal pain, N/V. No concerning upper or lower GI signs/symptoms.   Past Medical History:  Diagnosis Date  . Diabetes mellitus without complication ([place])   . GERD (gastroesophageal reflux disease)   . Hx of [place] spotted fever 1-1-12011  . Hypertension   . Sleep apnea    wears cpap     Past Surgical History:  Procedure Laterality Date  . COLONOSCOPY  09/2012   Dr. Britta Mccreedy. Diverticulosis, descending adenomatous colon polyp, hyperplastic rectal polyp. ?mass in anal canal evaluated by surgery and consistent with internal hemorrhoid  . KNEE SURGERY Left 10/2014  . KNEE SURGERY Right 09/2015    Current Outpatient Medications  Medication Sig Dispense Refill  . aspirin 81 MG chewable tablet Chew 81 mg by mouth daily.    Timothy Kitchen glucose blood test strip Use to check blood glucose once daily at varying times.  Dispense Verio One Touch Test strips 100 each 4  . hydrOXYzine (ATARAX/VISTARIL) 50 MG tablet Take 1 tablet (50 mg  total) by mouth at bedtime as needed. 30 tablet 3  . irbesartan (AVAPRO) 150 MG tablet TAKE 1 TABLET BY MOUTH EVERY DAY 30 tablet 5  . metFORMIN (GLUCOPHAGE XR) 500 MG 24 hr tablet Take 1 tablet (500 mg total) by mouth daily with breakfast. 180 tablet 3  . Multiple Vitamins-Minerals (CENTRUM SILVER 50+MEN PO) Take 1 tablet by mouth. Every now and then    . Omega-3 Fatty Acids (FISH OIL PO) Take by mouth daily.    [name] DELICA LANCETS 16R MISC Use to check blood glucose once daily at varying times 100 each 4  . Probiotic Product (PROBIOTIC PO) Take by mouth 3 (three) times a week.     No current facility-administered medications for this visit.     Allergies as of 01/23/2019 - Review Complete 01/23/2019  Allergen Reaction Noted  . Ramipril Cough 11/11/2016    Family History  Problem Relation Age of Onset  . Heart disease Mother   . Congestive Heart Failure Mother   . Diabetes Father   . Aneurysm Sister   . Diabetes Paternal Uncle   . Diabetes Paternal Grandmother   . Colon cancer Neg Hx   . Colon polyps Neg Hx     Social History   Socioeconomic History  . Marital status: Single    Spouse name: Not on file  . Number of children: Not on file  . Years of education: Not on file  . Highest education level: 12th grade  Occupational History  .  Occupation: Retired    Comment: Pensions consultant and gam[employer]  Social Needs  . Financial resource strain: Not hard at all  . Food insecurity:    Worry: Never true    Inability: Never true  . Transportation needs:    Medical: No    Non-medical: No  Tobacco Use  . Smoking status: Never Smoker  . Smokeless tobacco: Never Used  Substance and Sexual Activity  . Alcohol use: No  . Drug use: No  . Sexual activity: Not Currently  Lifestyle  . Physical activity:    Days per week: 7 days    Minutes per session: 50 min  . Stress: Not at all  Relationships  . Social connections:    Talks on phone: More than three times a week    Gets  together: More than three times a week    Attends religious service: More than 4 times per year    Active member of club or organization: Yes    Attends meetings of clubs or organizations: More than 4 times per year    Relationship status: Never married  Other Topics Concern  . Not on file  Social History Narrative  . Not on file    Review of Systems: Gen: Denies fever, chills, anorexia. Denies fatigue, weakness, weight loss.  CV: Denies chest pain, palpitations, syncope, peripheral edema, and claudication. Resp: Denies dyspnea at rest, cough, wheezing, coughing up blood, and pleurisy. GI: see HPI Derm: Denies rash, itching, dry skin Psych: Denies depression, anxiety, memory loss, confusion. No homicidal or suicidal ideation.  Heme: Denies bruising, bleeding, and enlarged lymph nodes.  Physical Exam: BP (!) 151/85   Pulse 70   Temp (!) 96.7 F (35.9 C) (Oral)   Ht 5\' 10"  (1.778 m)   Wt 272 lb 9.6 oz (123.7 kg)   BMI 39.11 kg/m  General:   Alert and oriented. No distress noted. Pleasant and cooperative.  Head:  Normocephalic and atraumatic. Eyes:  Conjuctiva clear without scleral icterus. Mouth:  Oral mucosa pink and moist.  Lungs: Clear to auscultation bilaterally Cardiac: S1 S2 present without obvious murmur  Abdomen:  +BS, soft, non-tender and non-distended. Moderate umbilical hernia, soft, easily reducible Msk:  Symmetrical without gross deformities. Normal posture. Extremities:  Without edema. Neurologic:  Alert and  oriented x4 Psych:  Alert and cooperative. Normal mood and affect.

## 2019-01-23 NOTE — Telephone Encounter (Signed)
Pre-op scheduled for 02/02/2019 Thursday at 2:45pm. Letter mailed. [email or web address]

## 2019-01-23 NOTE — Assessment & Plan Note (Signed)
68 year old male with remote history of rectal bleeding, now resolved. Last colonoscopy in 2013 by Dr. Britta Mccreedy with adenoma. No recent surveillance since that time. No concerning signs/symptoms at this point; he notes that rectal bleeding from last year has now resolved. Likely was benign anorectal source but needs surveillance.  Proceed with colonoscopy with Dr. Oneida Alar in the near future. The risks, benefits, and alternatives have been discussed in detail with the patient. They state understanding and desire to proceed.  Propofol No metformin the day of the procedure

## 2019-01-24 ENCOUNTER — Other Ambulatory Visit: Payer: Self-pay

## 2019-01-24 ENCOUNTER — Inpatient Hospital Stay (HOSPITAL_COMMUNITY): Payer: Medicare Other

## 2019-01-24 ENCOUNTER — Inpatient Hospital Stay (HOSPITAL_COMMUNITY): Payer: Medicare Other | Attending: Internal Medicine | Admitting: Internal Medicine

## 2019-01-24 ENCOUNTER — Encounter (HOSPITAL_COMMUNITY): Payer: Self-pay | Admitting: Internal Medicine

## 2019-01-24 VITALS — BP 157/83 | HR 69 | Temp 98.0°F | Resp 16 | Ht 71.0 in | Wt 272.9 lb

## 2019-01-24 DIAGNOSIS — M2550 Pain in unspecified joint: Secondary | ICD-10-CM | POA: Diagnosis not present

## 2019-01-24 DIAGNOSIS — Z7984 Long term (current) use of oral hypoglycemic drugs: Secondary | ICD-10-CM | POA: Diagnosis not present

## 2019-01-24 DIAGNOSIS — E669 Obesity, unspecified: Secondary | ICD-10-CM | POA: Insufficient documentation

## 2019-01-24 DIAGNOSIS — K219 Gastro-esophageal reflux disease without esophagitis: Secondary | ICD-10-CM | POA: Diagnosis not present

## 2019-01-24 DIAGNOSIS — M25559 Pain in unspecified hip: Secondary | ICD-10-CM

## 2019-01-24 DIAGNOSIS — Z800 Family history of malignant neoplasm of digestive organs: Secondary | ICD-10-CM | POA: Insufficient documentation

## 2019-01-24 DIAGNOSIS — D751 Secondary polycythemia: Secondary | ICD-10-CM | POA: Diagnosis not present

## 2019-01-24 DIAGNOSIS — Z79899 Other long term (current) drug therapy: Secondary | ICD-10-CM | POA: Diagnosis not present

## 2019-01-24 DIAGNOSIS — E119 Type 2 diabetes mellitus without complications: Secondary | ICD-10-CM | POA: Insufficient documentation

## 2019-01-24 DIAGNOSIS — I10 Essential (primary) hypertension: Secondary | ICD-10-CM | POA: Insufficient documentation

## 2019-01-24 DIAGNOSIS — Z86010 Personal history of colonic polyps: Secondary | ICD-10-CM | POA: Diagnosis not present

## 2019-01-24 DIAGNOSIS — K5730 Diverticulosis of large intestine without perforation or abscess without bleeding: Secondary | ICD-10-CM | POA: Insufficient documentation

## 2019-01-24 DIAGNOSIS — Z7982 Long term (current) use of aspirin: Secondary | ICD-10-CM | POA: Insufficient documentation

## 2019-01-24 DIAGNOSIS — R972 Elevated prostate specific antigen [PSA]: Secondary | ICD-10-CM | POA: Insufficient documentation

## 2019-01-24 DIAGNOSIS — G4733 Obstructive sleep apnea (adult) (pediatric): Secondary | ICD-10-CM | POA: Diagnosis not present

## 2019-01-24 DIAGNOSIS — R945 Abnormal results of liver function studies: Secondary | ICD-10-CM | POA: Insufficient documentation

## 2019-01-24 LAB — CBC WITH DIFFERENTIAL/PLATELET
Abs Immature Granulocytes: 0.02 10*3/uL (ref 0.00–0.07)
Basophils Absolute: 0.1 10*3/uL (ref 0.0–0.1)
Basophils Relative: 1 %
EOS ABS: 0.2 10*3/uL (ref 0.0–0.5)
Eosinophils Relative: 2 %
HCT: 52.9 % — ABNORMAL HIGH (ref 39.0–52.0)
Hemoglobin: 18.2 g/dL — ABNORMAL HIGH (ref 13.0–17.0)
Immature Granulocytes: 0 %
Lymphocytes Relative: 23 %
Lymphs Abs: 1.7 10*3/uL (ref 0.7–4.0)
MCH: 31.8 pg (ref 26.0–34.0)
MCHC: 34.4 g/dL (ref 30.0–36.0)
MCV: 92.3 fL (ref 80.0–100.0)
Monocytes Absolute: 0.6 10*3/uL (ref 0.1–1.0)
Monocytes Relative: 7 %
Neutro Abs: 5 10*3/uL (ref 1.7–7.7)
Neutrophils Relative %: 67 %
Platelets: 229 10*3/uL (ref 150–400)
RBC: 5.73 MIL/uL (ref 4.22–5.81)
RDW: 12.2 % (ref 11.5–15.5)
WBC: 7.5 10*3/uL (ref 4.0–10.5)
nRBC: 0 % (ref 0.0–0.2)

## 2019-01-24 LAB — LACTATE DEHYDROGENASE: LDH: 137 U/L (ref 98–192)

## 2019-01-24 LAB — COMPREHENSIVE METABOLIC PANEL
ALT: 32 U/L (ref 0–44)
AST: 24 U/L (ref 15–41)
Albumin: 4.1 g/dL (ref 3.5–5.0)
Alkaline Phosphatase: 78 U/L (ref 38–126)
Anion gap: 9 (ref 5–15)
BUN: 21 mg/dL (ref 8–23)
CALCIUM: 9.2 mg/dL (ref 8.9–10.3)
CO2: 22 mmol/L (ref 22–32)
Chloride: 105 mmol/L (ref 98–111)
Creatinine, Ser: 1.16 mg/dL (ref 0.61–1.24)
GFR calc Af Amer: >60 mL/min (ref >60)
GFR calc non Af Amer: >60 mL/min (ref >60)
Glucose, Bld: 210 mg/dL — ABNORMAL HIGH (ref 70–99)
Potassium: 4.2 mmol/L (ref 3.5–5.1)
Sodium: 136 mmol/L (ref 135–145)
Total Bilirubin: 1 mg/dL (ref 0.3–1.2)
Total Protein: 7.6 g/dL (ref 6.5–8.1)

## 2019-01-24 LAB — IRON AND TIBC
Iron: 163 ug/dL (ref 45–182)
Saturation Ratios: 46 % — ABNORMAL HIGH (ref 17.9–39.5)
TIBC: 353 ug/dL (ref 250–450)
UIBC: 190 ug/dL

## 2019-01-24 LAB — SEDIMENTATION RATE: Sed Rate: 2 mm/hr (ref 0–16)

## 2019-01-24 LAB — FERRITIN: Ferritin: 74 ng/mL (ref 24–336)

## 2019-01-24 NOTE — Progress Notes (Signed)
Diagnosis Polycythemia, secondary - Plan: CBC with Differential/Platelet, Comprehensive metabolic panel, Lactate dehydrogenase, Ferritin, Iron and TIBC, Transferrin Saturation, Erythropoietin, BCR-ABL1 FISH, JAK2 V617F, w Reflex to CALR/E12/MPL, Hemochromatosis DNA-PCR(c282y,h63d), Protein electrophoresis, serum, Hepatitis panel, acute, Hepatitis B core antibody, total, Hepatitis B surface antibody,qualitative, Hepatitis B surface antigen, Hepatitis C RNA quantitative, HIV Antibody (routine testing w rflx), ANA, IFA (with reflex), Rheumatoid factor, Sedimentation rate  Pain in joint involving pelvic region and thigh, unspecified laterality - Plan: CBC with Differential/Platelet, Comprehensive metabolic panel, Lactate dehydrogenase, Ferritin, Iron and TIBC, Transferrin Saturation, Erythropoietin, BCR-ABL1 FISH, JAK2 V617F, w Reflex to CALR/E12/MPL, Hemochromatosis DNA-PCR(c282y,h63d), Protein electrophoresis, serum, Hepatitis panel, acute, Hepatitis B core antibody, total, Hepatitis B surface antibody,qualitative, Hepatitis B surface antigen, Hepatitis C RNA quantitative, HIV Antibody (routine testing w rflx), ANA, IFA (with reflex), Rheumatoid factor, Sedimentation rate  Staging Cancer Staging No matching staging information was found for the patient.  Assessment and Plan:  1.  Polycythemia.  68 year old male referred for evaluation due to elevated HCT.  Pt denies smoking history.  He reports he has sleep apnea and uses CPAP.  Pt has family history of an uncle with pancreatic cancer.  He denies any family history of liver problems.  Pt had labs done 12/23/2018 that were reviewed and showed WBC 5.8 HB 18.5 HCT 53 plts 246,000.  Chemistries WNL with Cr 1.16 K+ 4.2 and normal LFTs.  PSA elevated at 7.5.  He denies any headaches or blurred vision.  He reports he is frequently hot.  Denies any skin changes.  He reports joint pain but has undergone knee replacement.  Pt is followed by urology and had prior  prostate biopsy that was negative.  Pt is seen today for consultation due to polycythemia.   Lab evaluation done today 01/24/2019 reviewed and showed WBC 7.5 HB 18.2 HCT 53 plts 229,000.  He has a normal differential.  Chemistries WNL with K+ 4.2 Cr 1.16 and normal LFTs.  Bilirubin 1.  Ferritin 74 with transferrin saturation of 46.  I have awaiting results of EPO, hepatitis panel, HIV, Jak2 and BCR/ABL, hemochromatosis gene evaluation. Review of chart shows HCT 49 dating back to 04/2018.   I have discussed potential etiologies of primary versus secondary polycythemia.  Pt will RTC in 2 weeks to go over results.  Further recommendations may follow once labs have been reviewed.    2.  Elevated PSA.  Pt has PSA of 7.5 .  He is followed by [facility].  He had prostate biopsy done 03/30/2017 with 12 cores submitted that were benign.  Pt should continue to follow-up with urology as directed.   3.  Sleep apnea.  I have discussed with him OSA may also cause secondary polycythemia.  If lab evaluation unrevealing, pt will be referred for sleep medicine evaluation.  Pt reports he uses CPAP.   4.  Joint pain.  Pt has undergone knee surgery.  Will check SPEP, ANA, CRP, RF.  Follow-up with PCP or urology as recommended.   5.  Hypertension.  BP is 157/83.  Follow-up with PCP.   6.  Health maintenance.  Pt has been seen by GI and is scheduled for colonoscopy.  Will follow-up results.    40 minutes spent with more than 50% spent in review of records, counseling and coordination of care.    HPI:  68 year old male referred for evaluation due to elevated HCT.  Pt denies smoking history.  He reports he has sleep apnea and uses CPAP.  Pt has family history of an uncle with pancreatic cancer.  He denies any family history of liver problems.  Pt had labs done 12/23/2018 that were reviewed and showed WBC 5.8 HB 18.5 HCT 53 plts 246,000.  Chemistries WNL with Cr 1.16 K+ 4.2 and normal LFTs.  PSA elevated at 7.5.  He denies  any headaches or blurred vision.  He reports he is frequently hot.  Denies any skin changes.  He reports joint pain but has undergone knee replacement.  Pt is followed by urology and had prior prostate biopsy that was negative.  Pt is seen today for consultation due to polycythemia.   Problem List Patient Active Problem List   Diagnosis Date Noted  . History of colonic polyps [Z86.010] 01/23/2019  . History of rectal bleeding [Z87.19] 08/25/2018  . OSA on CPAP [G47.33, Z99.89] 06/21/2017  . Diabetes mellitus type 2 in obese ([place]) [E11.69, E66.9] 10/22/2016  . Elevated PSA [R97.20] 10/22/2016  . Prostatitis, acute [N41.0] 10/28/2015  . Constipation [K59.00] 10/28/2015  . Hypertension associated with diabetes ([place]) [E11.59, I10] 02/03/2011    Past Medical History Past Medical History:  Diagnosis Date  . Diabetes mellitus without complication ([place])   . GERD (gastroesophageal reflux disease)   . Hx of Rocky [place] spotted fever 1-1-12011  . Hypertension   . Sleep apnea    wears cpap     Past Surgical History Past Surgical History:  Procedure Laterality Date  . COLONOSCOPY  09/2012   Dr. Britta Mccreedy. Diverticulosis, descending adenomatous colon polyp, hyperplastic rectal polyp. ?mass in anal canal evaluated by surgery and consistent with internal hemorrhoid  . KNEE SURGERY Left 10/2014  . KNEE SURGERY Right 09/2015    Family History Family History  Problem Relation Age of Onset  . Heart disease Mother   . Congestive Heart Failure Mother   . Diabetes Father   . Aneurysm Sister   . Diabetes Paternal Uncle   . Diabetes Paternal Grandmother   . Colon cancer Neg Hx   . Colon polyps Neg Hx      Social History  reports that he has never smoked. He has never used smokeless tobacco. He reports that he does not drink alcohol or use drugs.  Medications  Current Outpatient Medications:  .  aspirin 81 MG chewable tablet, Chew 81 mg by mouth daily., Disp: , Rfl:  .  glucose blood  test strip, Use to check blood glucose once daily at varying times.  Dispense Verio One Touch Test strips, Disp: 100 each, Rfl: 4 .  hydrOXYzine (ATARAX/VISTARIL) 50 MG tablet, Take 1 tablet (50 mg total) by mouth at bedtime as needed., Disp: 30 tablet, Rfl: 3 .  irbesartan (AVAPRO) 150 MG tablet, TAKE 1 TABLET BY MOUTH EVERY DAY, Disp: 30 tablet, Rfl: 5 .  metFORMIN (GLUCOPHAGE XR) 500 MG 24 hr tablet, Take 1 tablet (500 mg total) by mouth daily with breakfast., Disp: 180 tablet, Rfl: 3 .  Multiple Vitamins-Minerals (CENTRUM SILVER 50+MEN PO), Take 1 tablet by mouth. Every now and then, Disp: , Rfl:  .  Omega-3 Fatty Acids (FISH OIL PO), Take by mouth daily., Disp: , Rfl:  .  ONETOUCH DELICA LANCETS 75T MISC, Use to check blood glucose once daily at varying times, Disp: 100 each, Rfl: 4 .  Probiotic Product (PROBIOTIC PO), Take by mouth 3 (three) times a week., Disp: , Rfl:   Allergies Ramipril  Review of Systems Review of Systems - Oncology ROS negative other than excessive heat  Physical Exam  Vitals Wt Readings from Last 3 Encounters:  01/24/19 272 lb 14.4 oz (123.8 kg)  01/23/19 272 lb 9.6 oz (123.7 kg)  12/22/18 277 lb 6.4 oz (125.8 kg)   Temp Readings from Last 3 Encounters:  01/24/19 98 F (36.7 C) (Oral)  01/23/19 (!) 96.7 F (35.9 C) (Oral)  12/22/18 (!) 97.5 F (36.4 C) (Oral)   BP Readings from Last 3 Encounters:  01/24/19 (!) 157/83  01/23/19 (!) 151/85  12/22/18 (!) 141/82   Pulse Readings from Last 3 Encounters:  01/24/19 69  01/23/19 70  12/22/18 62   Constitutional: Well-developed, well-nourished, and in no distress.   HENT: Head: Normocephalic and atraumatic.  Mouth/Throat: No oropharyngeal exudate. Mucosa moist. Eyes: Pupils are equal, round, and reactive to light. Conjunctivae are normal. No scleral icterus.  Neck: Normal range of motion. Neck supple. No JVD present.  Cardiovascular: Normal rate, regular rhythm and normal heart sounds.  Exam  reveals no gallop and no friction rub.   No murmur heard. Pulmonary/Chest: Effort normal and breath sounds normal. No respiratory distress. No wheezes.No rales.  Abdominal: Soft. Bowel sounds are normal. No distension. There is no tenderness. There is no guarding. Umbilical hernia.   Musculoskeletal: No edema or tenderness. Scar noted from prior knee surgery.  Varicose veins noted.   Lymphadenopathy: No cervical,axillary or supraclavicular adenopathy.  Neurological: Alert and oriented to person, place, and time. No cranial nerve deficit.  Skin: Skin is warm and dry. No rash noted. No erythema. No pallor.  Psychiatric: Affect and judgment normal.   Labs Appointment on 01/24/2019  Component Date Value Ref Range Status  . LDH 01/24/2019 137  98 - 192 U/L Final   Performed at [facility], [address], [place], [place] [address]  . Sodium 01/24/2019 136  135 - 145 mmol/L Final  . Potassium 01/24/2019 4.2  3.5 - 5.1 mmol/L Final  . Chloride 01/24/2019 105  98 - 111 mmol/L Final  . CO2 01/24/2019 22  22 - 32 mmol/L Final  . Glucose, Bld 01/24/2019 210* 70 - 99 mg/dL Final  . BUN 01/24/2019 21  8 - 23 mg/dL Final  . Creatinine, Ser 01/24/2019 1.16  0.61 - 1.24 mg/dL Final  . Calcium 01/24/2019 9.2  8.9 - 10.3 mg/dL Final  . Total Protein 01/24/2019 7.6  6.5 - 8.1 g/dL Final  . Albumin 01/24/2019 4.1  3.5 - 5.0 g/dL Final  . AST 01/24/2019 24  15 - 41 U/L Final  . ALT 01/24/2019 32  0 - 44 U/L Final  . Alkaline Phosphatase 01/24/2019 78  38 - 126 U/L Final  . Total Bilirubin 01/24/2019 1.0  0.3 - 1.2 mg/dL Final  . GFR calc non Af Amer 01/24/2019 >60  >60 mL/min Final  . GFR calc Af Amer 01/24/2019 >60  >60 mL/min Final  . Anion gap 01/24/2019 9  5 - 15 Final   Performed at [facility], [address] [place], [place] [address]  . WBC 01/24/2019 7.5  4.0 - 10.5 K/uL Final  . RBC 01/24/2019 5.73  4.22 - 5.81 MIL/uL Final  . Hemoglobin 01/24/2019 18.2* 13.0 - 17.0 g/dL Final  . HCT  01/24/2019 52.9* 39.0 - 52.0 % Final  . MCV 01/24/2019 92.3  80.0 - 100.0 fL Final  . MCH 01/24/2019 31.8  26.0 - 34.0 pg Final  . MCHC 01/24/2019 34.4  30.0 - 36.0 g/dL Final  . RDW 01/24/2019 12.2  11.5 - 15.5 % Final  .  Platelets 01/24/2019 229  150 - 400 K/uL Final  . nRBC 01/24/2019 0.0  0.0 - 0.2 % Final  . Neutrophils Relative % 01/24/2019 67  % Final  . Neutro Abs 01/24/2019 5.0  1.7 - 7.7 K/uL Final  . Lymphocytes Relative 01/24/2019 23  % Final  . Lymphs Abs 01/24/2019 1.7  0.7 - 4.0 K/uL Final  . Monocytes Relative 01/24/2019 7  % Final  . Monocytes Absolute 01/24/2019 0.6  0.1 - 1.0 K/uL Final  . Eosinophils Relative 01/24/2019 2  % Final  . Eosinophils Absolute 01/24/2019 0.2  0.0 - 0.5 K/uL Final  . Basophils Relative 01/24/2019 1  % Final  . Basophils Absolute 01/24/2019 0.1  0.0 - 0.1 K/uL Final  . Immature Granulocytes 01/24/2019 0  % Final  . Abs Immature Granulocytes 01/24/2019 0.02  0.00 - 0.07 K/uL Final   Performed at [facility], [address], [place], [place] [address]     Pathology Orders Placed This Encounter  Procedures  . CBC with Differential/Platelet    Standing Status:   Future    Number of Occurrences:   1    Standing Expiration Date:   01/25/2020  . Comprehensive metabolic panel    Standing Status:   Future    Number of Occurrences:   1    Standing Expiration Date:   01/25/2020  . Lactate dehydrogenase    Standing Status:   Future    Number of Occurrences:   1    Standing Expiration Date:   01/25/2020  . Ferritin    Standing Status:   Future    Number of Occurrences:   1    Standing Expiration Date:   01/25/2020  . Iron and TIBC    Standing Status:   Future    Number of Occurrences:   1    Standing Expiration Date:   01/25/2020  . Transferrin Saturation    Standing Status:   Future    Number of Occurrences:   1    Standing Expiration Date:   01/25/2020  . Erythropoietin    Standing Status:   Future    Number of Occurrences:   1     Standing Expiration Date:   01/25/2020  . BCR-ABL1 FISH    Standing Status:   Future    Number of Occurrences:   1    Standing Expiration Date:   01/25/2020  . JAK2 V617F, w Reflex to CALR/E12/MPL    Standing Status:   Future    Number of Occurrences:   1    Standing Expiration Date:   01/25/2020  . Hemochromatosis DNA-PCR(c282y,h63d)    Standing Status:   Future    Number of Occurrences:   1    Standing Expiration Date:   01/25/2020  . Protein electrophoresis, serum    Standing Status:   Future    Number of Occurrences:   1    Standing Expiration Date:   01/25/2020  . Hepatitis panel, acute    Standing Status:   Future    Number of Occurrences:   1    Standing Expiration Date:   01/25/2020  . Hepatitis B core antibody, total    Standing Status:   Future    Number of Occurrences:   1    Standing Expiration Date:   01/25/2020  . Hepatitis B surface antibody,qualitative    Standing Status:   Future    Number of Occurrences:   1    Standing  Expiration Date:   01/25/2020  . Hepatitis B surface antigen    Standing Status:   Future    Number of Occurrences:   1    Standing Expiration Date:   01/25/2020  . Hepatitis C RNA quantitative    Standing Status:   Future    Number of Occurrences:   1    Standing Expiration Date:   01/25/2020  . HIV Antibody (routine testing w rflx)    Standing Status:   Future    Number of Occurrences:   1    Standing Expiration Date:   01/25/2020  . ANA, IFA (with reflex)    Standing Status:   Future    Number of Occurrences:   1    Standing Expiration Date:   01/25/2020  . Rheumatoid factor    Standing Status:   Future    Number of Occurrences:   1    Standing Expiration Date:   01/25/2020  . Sedimentation rate    Standing Status:   Future    Number of Occurrences:   1    Standing Expiration Date:   01/25/2020       Zoila Shutter MD

## 2019-01-25 LAB — HEPATITIS B SURFACE ANTIGEN: Hepatitis B Surface Ag: NEGATIVE

## 2019-01-25 LAB — HEPATITIS B SURFACE ANTIBODY,QUALITATIVE: Hep B S Ab: REACTIVE

## 2019-01-25 LAB — RHEUMATOID FACTOR: Rheumatoid fact SerPl-aCnc: <10 IU/mL (ref 0.0–13.9)

## 2019-01-25 LAB — HEPATITIS PANEL, ACUTE
HCV Ab: <0.1 {s_co_ratio} (ref 0.0–0.9)
Hep A IgM: NEGATIVE
Hep B C IgM: NEGATIVE
Hepatitis B Surface Ag: NEGATIVE

## 2019-01-25 LAB — PROTEIN ELECTROPHORESIS, SERUM
A/G Ratio: 1.2 (ref 0.7–1.7)
ALPHA-1-GLOBULIN: 0.1 g/dL (ref 0.0–0.4)
Albumin ELP: 3.9 g/dL (ref 2.9–4.4)
Alpha-2-Globulin: 0.8 g/dL (ref 0.4–1.0)
Beta Globulin: 1.1 g/dL (ref 0.7–1.3)
Gamma Globulin: 1.1 g/dL (ref 0.4–1.8)
Globulin, Total: 3.2 g/dL (ref 2.2–3.9)
Total Protein ELP: 7.1 g/dL (ref 6.0–8.5)

## 2019-01-25 LAB — ANTINUCLEAR ANTIBODIES, IFA: ANA Ab, IFA: NEGATIVE

## 2019-01-25 LAB — ERYTHROPOIETIN: Erythropoietin: 10.6 m[IU]/mL (ref 2.6–18.5)

## 2019-01-25 LAB — HCV RNA QUANT: HCV Quantitative: NOT DETECTED [IU]/mL

## 2019-01-25 LAB — HEPATITIS B CORE ANTIBODY, TOTAL: Hep B Core Total Ab: POSITIVE — AB

## 2019-01-25 LAB — HIV ANTIBODY (ROUTINE TESTING W REFLEX): HIV Screen 4th Generation wRfx: NONREACTIVE

## 2019-01-30 LAB — HEMOCHROMATOSIS DNA-PCR(C282Y,H63D)

## 2019-02-02 ENCOUNTER — Encounter (HOSPITAL_COMMUNITY)
Admission: RE | Admit: 2019-02-02 | Discharge: 2019-02-02 | Disposition: A | Discharge status: Home / Self Care | Payer: Medicare Other | Source: Ambulatory Visit | Attending: Gastroenterology | Admitting: Gastroenterology

## 2019-02-03 LAB — CALR + JAK2 E12-15 + MPL (REFLEXED)

## 2019-02-03 LAB — JAK2 V617F, W REFLEX TO CALR/E12/MPL

## 2019-02-04 LAB — BCR-ABL1 FISH
Cells Analyzed: 200
Cells Counted: 200

## 2019-02-07 ENCOUNTER — Ambulatory Visit (HOSPITAL_COMMUNITY): Payer: Medicare Other | Admitting: Anesthesiology

## 2019-02-07 ENCOUNTER — Encounter (HOSPITAL_COMMUNITY)
Admission: RE | Disposition: A | Discharge status: Home / Self Care | Payer: Self-pay | Source: Home / Self Care | Attending: Gastroenterology

## 2019-02-07 ENCOUNTER — Encounter (HOSPITAL_COMMUNITY): Payer: Self-pay | Admitting: *Deleted

## 2019-02-07 ENCOUNTER — Ambulatory Visit (HOSPITAL_COMMUNITY)
Admission: RE | Admit: 2019-02-07 | Discharge: 2019-02-07 | Disposition: A | Discharge status: Home / Self Care | Payer: Medicare Other | Attending: Gastroenterology | Admitting: Gastroenterology

## 2019-02-07 ENCOUNTER — Encounter: Payer: Self-pay | Admitting: Gastroenterology

## 2019-02-07 DIAGNOSIS — K5730 Diverticulosis of large intestine without perforation or abscess without bleeding: Secondary | ICD-10-CM | POA: Diagnosis not present

## 2019-02-07 DIAGNOSIS — K648 Other hemorrhoids: Secondary | ICD-10-CM | POA: Diagnosis not present

## 2019-02-07 DIAGNOSIS — Z7984 Long term (current) use of oral hypoglycemic drugs: Secondary | ICD-10-CM | POA: Diagnosis not present

## 2019-02-07 DIAGNOSIS — Z86010 Personal history of colonic polyps: Secondary | ICD-10-CM

## 2019-02-07 DIAGNOSIS — I10 Essential (primary) hypertension: Secondary | ICD-10-CM | POA: Insufficient documentation

## 2019-02-07 DIAGNOSIS — D123 Benign neoplasm of transverse colon: Secondary | ICD-10-CM | POA: Diagnosis not present

## 2019-02-07 DIAGNOSIS — G4730 Sleep apnea, unspecified: Secondary | ICD-10-CM | POA: Insufficient documentation

## 2019-02-07 DIAGNOSIS — K644 Residual hemorrhoidal skin tags: Secondary | ICD-10-CM | POA: Diagnosis not present

## 2019-02-07 DIAGNOSIS — Z79899 Other long term (current) drug therapy: Secondary | ICD-10-CM | POA: Diagnosis not present

## 2019-02-07 DIAGNOSIS — Z7982 Long term (current) use of aspirin: Secondary | ICD-10-CM | POA: Diagnosis not present

## 2019-02-07 DIAGNOSIS — Z1211 Encounter for screening for malignant neoplasm of colon: Secondary | ICD-10-CM | POA: Diagnosis not present

## 2019-02-07 DIAGNOSIS — E119 Type 2 diabetes mellitus without complications: Secondary | ICD-10-CM | POA: Diagnosis not present

## 2019-02-07 DIAGNOSIS — D122 Benign neoplasm of ascending colon: Secondary | ICD-10-CM

## 2019-02-07 DIAGNOSIS — Q438 Other specified congenital malformations of intestine: Secondary | ICD-10-CM | POA: Diagnosis not present

## 2019-02-07 HISTORY — PX: COLONOSCOPY WITH PROPOFOL: SHX5780

## 2019-02-07 HISTORY — PX: POLYPECTOMY: SHX5525

## 2019-02-07 LAB — GLUCOSE, CAPILLARY: GLUCOSE-CAPILLARY: 282 mg/dL — AB (ref 70–99)

## 2019-02-07 SURGERY — COLONOSCOPY WITH PROPOFOL
Anesthesia: Monitor Anesthesia Care

## 2019-02-07 MED ORDER — PROPOFOL 10 MG/ML IV BOLUS
INTRAVENOUS | Status: DC | PRN
  Administered 2019-02-07 (×2): 20 mg via INTRAVENOUS
  Administered 2019-02-07: 40 mg via INTRAVENOUS

## 2019-02-07 MED ORDER — PROPOFOL 500 MG/50ML IV EMUL
INTRAVENOUS | Status: DC | PRN
  Administered 2019-02-07: 200 ug/kg/min via INTRAVENOUS
  Administered 2019-02-07: 100 ug/kg/min via INTRAVENOUS

## 2019-02-07 MED ORDER — CHLORHEXIDINE GLUCONATE CLOTH 2 % EX PADS: 6.0000 | MEDICATED_PAD | Freq: Once | CUTANEOUS | Status: DC

## 2019-02-07 MED ORDER — PROPOFOL 10 MG/ML IV BOLUS
INTRAVENOUS | Status: AC
  Filled 2019-02-07: qty 40

## 2019-02-07 MED ORDER — LACTATED RINGERS IV SOLN
INTRAVENOUS | Status: DC
  Administered 2019-02-07: 1000 mL via INTRAVENOUS

## 2019-02-07 NOTE — Anesthesia Preprocedure Evaluation (Addendum)
Anesthesia Evaluation  Patient identified by MRN, date of birth, ID band Patient awake    Reviewed: Allergy & Precautions, H&P , NPO status , Patient's Chart, lab work & pertinent test results  Airway Mallampati: II  TM Distance: >3 FB Neck ROM: full    Dental no notable dental hx.    Pulmonary neg pulmonary ROS, sleep apnea ,    Pulmonary exam normal breath sounds clear to auscultation       Cardiovascular Exercise Tolerance: Good hypertension, negative cardio ROS   Rhythm:regular Rate:Normal     Neuro/Psych negative neurological ROS  negative psych ROS   GI/Hepatic Neg liver ROS, GERD  ,  Endo/Other  negative endocrine ROSdiabetes  Renal/GU negative Renal ROS  negative genitourinary   Musculoskeletal   Abdominal   Peds  Hematology negative hematology ROS (+)   Anesthesia Other Findings   Reproductive/Obstetrics negative OB ROS                             Anesthesia Physical Anesthesia Plan  ASA: II  Anesthesia Plan: MAC   Post-op Pain Management:    Induction:   PONV Risk Score and Plan:   Airway Management Planned:   Additional Equipment:   Intra-op Plan:   Post-operative Plan:   Informed Consent: I have reviewed the patients History and Physical, chart, labs and discussed the procedure including the risks, benefits and alternatives for the proposed anesthesia with the patient or authorized representative who has indicated his/her understanding and acceptance.     Dental Advisory Given  Plan Discussed with: CRNA  Anesthesia Plan Comments:        Anesthesia Quick Evaluation

## 2019-02-07 NOTE — H&P (Addendum)
Primary Care Physician:  Dettinger, Fransisca Kaufmann, MD Primary Gastroenterologist:  Dr. Oneida Alar  Pre-Procedure History & Physical: HPI:  [name] is a 68 y.o. male here for  [place].  Past Medical History:  Diagnosis Date  . Diabetes mellitus without complication ([place])   . GERD (gastroesophageal reflux disease)   . Hx of [place] spotted fever 1-1-12011  . Hypertension   . Sleep apnea    wears cpap     Past Surgical History:  Procedure Laterality Date  . COLONOSCOPY  09/2012   Dr. Britta Mccreedy. Diverticulosis, descending adenomatous colon polyp, hyperplastic rectal polyp. ?mass in anal canal evaluated by surgery and consistent with internal hemorrhoid  . KNEE SURGERY Left 10/2014  . KNEE SURGERY Right 09/2015    Prior to Admission medications   Medication Sig Start Date End Date Taking? Authorizing Provider  glucose blood test strip Use to check blood glucose once daily at varying times.  Dispense Verio One Touch Test strips 11/11/16  Yes Cherre Robins, PharmD  hydrOXYzine (ATARAX/VISTARIL) 50 MG tablet Take 1 tablet (50 mg total) by mouth at bedtime as needed. Patient taking differently: Take 50 mg by mouth at bedtime as needed (allergies/sleep).  12/22/18  Yes Dettinger, Fransisca Kaufmann, MD  irbesartan (AVAPRO) 150 MG tablet TAKE 1 TABLET BY MOUTH EVERY DAY Patient taking differently: Take 150 mg by mouth at bedtime.  01/09/19  Yes Dettinger, Fransisca Kaufmann, MD  metFORMIN (GLUCOPHAGE XR) 500 MG 24 hr tablet Take 1 tablet (500 mg total) by mouth daily with breakfast. 12/22/18  Yes Dettinger, Fransisca Kaufmann, MD  Multiple Vitamins-Minerals (CENTRUM SILVER 50+MEN PO) Take 1 tablet by mouth daily. Every now and then   Yes [provider]  Omega-3 Fatty Acids (FISH OIL) 500 MG CAPS Take 500 mg by mouth daily.   Yes [provider]  Jonetta Speak LANCETS 56C MISC Use to check blood glucose once daily at varying times 11/11/16  Yes Eckard, Lynelle Smoke, PharmD  oxymetazoline  (AFRIN) 0.05 % nasal spray Place 1 spray into both nostrils at bedtime as needed for congestion.   Yes [provider]  Probiotic Product (PROBIOTIC PO) Take 1 capsule by mouth every Monday, Wednesday, and Friday.    Yes [provider]  aspirin EC 81 MG tablet Take 81 mg by mouth every evening.    [provider]    Allergies as of 01/23/2019 - Review Complete 01/23/2019  Allergen Reaction Noted  . Ramipril Cough 11/11/2016    Family History  Problem Relation Age of Onset  . Heart disease Mother   . Congestive Heart Failure Mother   . Diabetes Father   . Aneurysm Sister   . Diabetes Paternal Uncle   . Diabetes Paternal Grandmother   . Colon cancer Neg Hx   . Colon polyps Neg Hx     Social History   Socioeconomic History  . Marital status: Single    Spouse name: Not on file  . Number of children: Not on file  . Years of education: Not on file  . Highest education level: 12th grade  Occupational History  . Occupation: Retired    Comment: Pensions consultant and gam[employer]  Social Needs  . Financial resource strain: Not hard at all  . Food insecurity:    Worry: Never true    Inability: Never true  . Transportation needs:    Medical: No    Non-medical: No  Tobacco Use  . Smoking status: Never Smoker  . Smokeless tobacco: Never  Used  Substance and Sexual Activity  . Alcohol use: No  . Drug use: No  . Sexual activity: Not Currently  Lifestyle  . Physical activity:    Days per week: 7 days    Minutes per session: 50 min  . Stress: Not at all  Relationships  . Social connections:    Talks on phone: More than three times a week    Gets together: More than three times a week    Attends religious service: More than 4 times per year    Active member of club or organization: Yes    Attends meetings of clubs or organizations: More than 4 times per year    Relationship status: Never married  . Intimate partner violence:    Fear of current or ex partner: No     Emotionally abused: No    Physically abused: No    Forced sexual activity: No  Other Topics Concern  . Not on file  Social History Narrative  . Not on file    Review of Systems: See HPI, otherwise negative ROS   Physical Exam: BP 126/87   Temp 97.6 F (36.4 C) (Oral)   SpO2 93%  General:   Alert,  pleasant and cooperative in NAD Head:  Normocephalic and atraumatic. Neck:  Supple; Lungs:  Clear throughout to auscultation.    Heart:  Regular rate and rhythm. Abdomen:  Soft, nontender and nondistended. Normal bowel sounds, without guarding, and without rebound.   Neurologic:  Alert and  oriented x4;  grossly normal neurologically.  Impression/Plan:     COLON CANCER SCREENING.  PLAN: 1. TCS TODAY. DISCUSSED PROCEDURE, BENEFITS, & RISKS: < 1% chance of medication reaction, bleeding, perforation, or rupture of spleen/liver.

## 2019-02-07 NOTE — Anesthesia Postprocedure Evaluation (Signed)
Anesthesia Post Note  Patient: [name]  Procedure(s) Performed: COLONOSCOPY WITH PROPOFOL (N/A ) POLYPECTOMY  Patient location during evaluation: PACU Anesthesia Type: MAC Level of consciousness: awake and alert and patient cooperative Pain management: satisfactory to patient Vital Signs Assessment: post-procedure vital signs reviewed and stable Respiratory status: spontaneous breathing Cardiovascular status: stable Postop Assessment: no apparent nausea or vomiting Anesthetic complications: no     Last Vitals:  Vitals:   02/07/19 0926 02/07/19 0930  BP: 101/65 (!) 103/50  Pulse: (!) 59 61  Resp: 10 17  Temp: 36.5 C   SpO2: 96% 93%    Last Pain:  Vitals:   02/07/19 0926  TempSrc:   PainSc: 0-No pain                 Deidra Spease

## 2019-02-07 NOTE — Anesthesia Procedure Notes (Signed)
Procedure Name: MAC Date/Time: 02/07/2019 8:39 AM Performed by: Vista Deck, CRNA Pre-anesthesia Checklist: Patient identified, Emergency Drugs available, Suction available, Timeout performed and Patient being monitored Patient Re-evaluated:Patient Re-evaluated prior to induction Oxygen Delivery Method: Non-rebreather mask

## 2019-02-07 NOTE — Discharge Instructions (Signed)
You have internal hemorrhoids and diverticulosis IN YOUR LEFT AND RIGHT COLON. YOU HAD ONE POLYP REMOVED.    DRINK WATER TO KEEP YOUR URINE LIGHT YELLOW.  CONTINUE YOUR WEIGHT LOSS EFFORTS. YOUR BODY MASS INDEX IS OVER 30 WHICH MEANS YOU ARE OBESE. OBESITY IS ASSOCIATED WITH AN INCREASE FOR ALL CANCERS, INCLUDING ESOPHAGEAL AND COLON CANCER. A WEIGHT OF 210 lbs or less  WILL GET YOUR BODY MASS INDEX(BMI) UNDER 30. If you are unable to lose weight on your own after 6 mos to a year, please call for a referral to the bariatric weight loss clinic.  FOLLOW A HIGH FIBER DIET. AVOID ITEMS THAT CAUSE BLOATING. See info below.  USE PREPARATION H FOUR TIMES  A DAY IF NEEDED TO RELIEVE RECTAL PAIN/PRESSURE/BLEEDING.   YOUR BIOPSY RESULTS WILL BE BACK IN 5 BUSINESS DAYS.  Next colonoscopy in 5-10 years.  Colonoscopy Care After Read the instructions outlined below and refer to this sheet in the next week. These discharge instructions provide you with general information on caring for yourself after you leave the hospital. While your treatment has been planned according to the most current medical practices available, unavoidable complications occasionally occur. If you have any problems or questions after discharge, call DR. Kiandra Sanguinetti, [phone].  ACTIVITY  You may resume your regular activity, but move at a slower pace for the next 24 hours.   Take frequent rest periods for the next 24 hours.   Walking will help get rid of the air and reduce the bloated feeling in your belly (abdomen).   No driving for 24 hours (because of the medicine (anesthesia) used during the test).   You may shower.   Do not sign any important legal documents or operate any machinery for 24 hours (because of the anesthesia used during the test).    NUTRITION  Drink plenty of fluids.   You may resume your normal diet as instructed by your doctor.   Begin with a light meal and progress to your normal diet. Heavy or  fried foods are harder to digest and may make you feel sick to your stomach (nauseated).   Avoid alcoholic beverages for 24 hours or as instructed.    MEDICATIONS  You may resume your normal medications.   WHAT YOU CAN EXPECT TODAY  Some feelings of bloating in the abdomen.   Passage of more gas than usual.   Spotting of blood in your stool or on the toilet paper  .  IF YOU HAD POLYPS REMOVED DURING THE COLONOSCOPY:  Eat a soft diet IF YOU HAVE NAUSEA, BLOATING, ABDOMINAL PAIN, OR VOMITING.    FINDING OUT THE RESULTS OF YOUR TEST Not all test results are available during your visit. DR. Oneida Alar WILL CALL YOU WITHIN 14 DAYS OF YOUR PROCEDUE WITH YOUR RESULTS. Do not assume everything is normal if you have not heard from DR. Jilliana Burkes, CALL HER OFFICE AT [phone].  SEEK IMMEDIATE MEDICAL ATTENTION AND CALL THE OFFICE: [phone] IF:  You have more than a spotting of blood in your stool.   Your belly is swollen (abdominal distention).   You are nauseated or vomiting.   You have a temperature over 101F.   You have abdominal pain or discomfort that is severe or gets worse throughout the day.  High-Fiber Diet A high-fiber diet changes your normal diet to include more whole grains, legumes, fruits, and vegetables. Changes in the diet involve replacing refined carbohydrates with unrefined foods. The calorie level of the diet is  essentially unchanged. The Dietary Reference Intake (recommended amount) for adult males is 38 grams per day. For adult females, it is 25 grams per day. Pregnant and lactating women should consume 28 grams of fiber per day. Fiber is the intact part of a plant that is not broken down during digestion. Functional fiber is fiber that has been isolated from the plant to provide a beneficial effect in the body.  PURPOSE  Increase stool bulk.   Ease and regulate bowel movements.   Lower cholesterol.   REDUCE RISK OF COLON CANCER  INDICATIONS THAT YOU  NEED MORE FIBER  Constipation and hemorrhoids.   Uncomplicated diverticulosis (intestine condition) and irritable bowel syndrome.   Weight management.   As a protective measure against hardening of the arteries (atherosclerosis), diabetes, and cancer.   GUIDELINES FOR INCREASING FIBER IN THE DIET  Start adding fiber to the diet slowly. A gradual increase of about 5 more grams (2 slices of whole-wheat bread, 2 servings of most fruits or vegetables, or 1 bowl of high-fiber cereal) per day is best. Too rapid an increase in fiber may result in constipation, flatulence, and bloating.   Drink enough water and fluids to keep your urine clear or pale yellow. Water, juice, or caffeine-free drinks are recommended. Not drinking enough fluid may cause constipation.   Eat a variety of high-fiber foods rather than one type of fiber.   Try to increase your intake of fiber through using high-fiber foods rather than fiber pills or supplements that contain small amounts of fiber.   The goal is to change the types of food eaten. Do not supplement your present diet with high-fiber foods, but replace foods in your present diet.   INCLUDE A VARIETY OF FIBER SOURCES  Replace refined and processed grains with whole grains, canned fruits with fresh fruits, and incorporate other fiber sources. White rice, white breads, and most bakery goods contain little or no fiber.   Brown whole-grain rice, buckwheat oats, and many fruits and vegetables are all good sources of fiber. These include: broccoli, Brussels sprouts, cabbage, cauliflower, beets, sweet potatoes, white potatoes (skin on), carrots, tomatoes, eggplant, squash, berries, fresh fruits, and dried fruits.   Cereals appear to be the richest source of fiber. Cereal fiber is found in whole grains and bran. Bran is the fiber-rich outer coat of cereal grain, which is largely removed in refining. In whole-grain cereals, the bran remains. In breakfast cereals, the  largest amount of fiber is found in those with "bran" in their names. The fiber content is sometimes indicated on the label.   You may need to include additional fruits and vegetables each day.   In baking, for 1 cup white flour, you may use the following substitutions:   1 cup whole-wheat flour minus 2 tablespoons.   1/2 cup white flour plus 1/2 cup whole-wheat flour.   Polyps, Colon  A polyp is extra tissue that grows inside your body. Colon polyps grow in the large intestine. The large intestine, also called the colon, is part of your digestive system. It is a long, hollow tube at the end of your digestive tract where your body makes and stores stool. Most polyps are not dangerous. They are benign. This means they are not cancerous. But over time, some types of polyps can turn into cancer. Polyps that are smaller than a pea are usually not harmful. But larger polyps could someday become or may already be cancerous. To be safe, doctors remove all polyps and  test them.   PREVENTION There is not one sure way to prevent polyps. You might be able to lower your risk of getting them if you:  Eat more fruits and vegetables and less fatty food.   Do not smoke.   Avoid alcohol.   Exercise every day.   Lose weight if you are overweight.   Eating more calcium and folate can also lower your risk of getting polyps. Some foods that are rich in calcium are milk, cheese, and broccoli. Some foods that are rich in folate are chickpeas, kidney beans, and spinach.    Diverticulosis Diverticulosis is a common condition that develops when small pouches (diverticula) form in the wall of the colon. The risk of diverticulosis increases with age. It happens more often in people who eat a low-fiber diet. Most individuals with diverticulosis have no symptoms. Those individuals with symptoms usually experience belly (abdominal) pain, constipation, or loose stools (diarrhea).  HOME CARE INSTRUCTIONS  Increase  the amount of fiber in your diet as directed by your caregiver or dietician. This may reduce symptoms of diverticulosis.   Drink at least 6 to 8 glasses of water each day to prevent constipation.   Try not to strain when you have a bowel movement.   Avoiding nuts and seeds to prevent complications is NOT NECESSARY.   FOODS HAVING HIGH FIBER CONTENT INCLUDE:  Fruits. Apple, peach, pear, tangerine, raisins, prunes.   Vegetables. Brussels sprouts, asparagus, broccoli, cabbage, carrot, cauliflower, romaine lettuce, spinach, summer squash, tomato, winter squash, zucchini.   Starchy Vegetables. Baked beans, kidney beans, lima beans, split peas, lentils, potatoes (with skin).   Grains. Whole wheat bread, brown rice, bran flake cereal, plain oatmeal, white rice, shredded wheat, bran muffins.   SEEK IMMEDIATE MEDICAL CARE IF:  You develop increasing pain or severe bloating.   You have an oral temperature above 101F.   You develop vomiting or bowel movements that are bloody or black.

## 2019-02-07 NOTE — Transfer of Care (Signed)
Immediate Anesthesia Transfer of Care Note  Patient: [name]  Procedure(s) Performed: COLONOSCOPY WITH PROPOFOL (N/A ) POLYPECTOMY  Patient Location: PACU  Anesthesia Type:MAC  Level of Consciousness: awake and patient cooperative  Airway & Oxygen Therapy: Patient Spontanous Breathing  Post-op Assessment: Report given to RN and Post -op Vital signs reviewed and stable  Post vital signs: Reviewed and stable  Last Vitals:  Vitals Value Taken Time  BP    Temp    Pulse 59 02/07/2019  9:25 AM  Resp 10 02/07/2019  9:25 AM  SpO2 96 % 02/07/2019  9:25 AM  Vitals shown include unvalidated device data.  Last Pain:  Vitals:   02/07/19 0840  TempSrc: Oral  PainSc:       Patients Stated Pain Goal: 7 (41/58/30 [id number])  Complications: No apparent anesthesia complications

## 2019-02-07 NOTE — Op Note (Signed)
[facility] Patient Name: [name] Procedure Date: 02/07/2019 8:26 AM MRN: [id number] Date of Birth: Jan 14, 1951 Attending MD: Barney Drain MD, MD CSN: [id number] Age: 68 Admit Type: Outpatient Procedure:                Colonoscopy WITH COLD SNARE POLYPECTOMY Indications:              Screening for colorectal malignant neoplasm Providers:                Barney Drain MD, MD, Janeece Riggers, RN, Aram Candela Referring MD:             Fransisca Kaufmann. Dettinger Medicines:                Propofol per Anesthesia Complications:            No immediate complications. Estimated Blood Loss:     Estimated blood loss was minimal. Procedure:                Pre-Anesthesia Assessment:                           - Prior to the procedure, a History and Physical                            was performed, and patient medications and                            allergies were reviewed. The patient's tolerance of                            previous anesthesia was also reviewed. The risks                            and benefits of the procedure and the sedation                            options and risks were discussed with the patient.                            All questions were answered, and informed consent                            was obtained. Prior Anticoagulants: The patient has                            taken no previous anticoagulant or antiplatelet                            agents. ASA Grade Assessment: II - A patient with                            mild systemic disease. After reviewing the risks                            and benefits, the patient was deemed in  satisfactory condition to undergo the procedure.                            After obtaining informed consent, the colonoscope                            was passed under direct vision. Throughout the                            procedure, the patient's blood pressure, pulse, and   oxygen saturations were monitored continuously. The                            CF-[id number] ([id number]) scope was introduced through                            the anus and advanced to the the cecum, identified                            by appendiceal orifice and ileocecal valve. The                            colonoscopy was performed with difficulty due to                            restricted mobility of the colon. Successful                            completion of the procedure was aided by                            withdrawing the scope and replacing with the                            pediatric colonoscope, straightening and shortening                            the scope to obtain bowel loop reduction and                            COLOWRAP. The patient tolerated the procedure well.                            The quality of the bowel preparation was good. The                            ileocecal valve, appendiceal orifice, and rectum                            were photographed. Scope In: 8:55:32 AM Scope Out: 9:19:51 AM Scope Withdrawal Time: 0 hours 22 minutes 1 second  Total Procedure Duration: 0 hours 24 minutes 19 seconds  Findings:      A 5 mm polyp was found in the ascending colon. The polyp was sessile.  The polyp was removed with a cold snare. Resection and retrieval were       complete.      Multiple small and large-mouthed diverticula were found in the       recto-sigmoid colon, sigmoid colon and ascending colon.      The recto-sigmoid colon and sigmoid colon were grossly tortuous. FIXED       RESTRICTED RECTOSIGMOID JUNCTION REQUIRING CHANGE TO PEDS COLONOSCOPE TO       COMPLETE THE EXAM.      External and internal hemorrhoids were found. Impression:               - One 5 mm polyp in the ascending colon, removed                            with a cold snare. Resected and retrieved.                           - Diverticulosis in the recto-sigmoid colon, in the                             sigmoid colon and in the ascending colon.                           - Tortuous colon.                           - External and internal hemorrhoids. Moderate Sedation:      Per Anesthesia Care Recommendation:           - Patient has a contact number available for                            emergencies. The signs and symptoms of potential                            delayed complications were discussed with the                            patient. Return to normal activities tomorrow.                            Written discharge instructions were provided to the                            patient.                           - High fiber diet.                           - Continue present medications.                           - Await pathology results.                           - Repeat colonoscopy in 5-10 years for surveillance. Procedure  Code(s):        --- Professional ---                           [phone], Colonoscopy, flexible; with removal of                            tumor(s), polyp(s), or other lesion(s) by snare                            technique Diagnosis Code(s):        --- Professional ---                           Z12.11, Encounter for screening for malignant                            neoplasm of colon                           D12.2, Benign neoplasm of ascending colon                           K64.8, Other hemorrhoids                           K57.30, Diverticulosis of large intestine without                            perforation or abscess without bleeding                           Q43.8, Other specified congenital malformations of                            intestine CPT copyright 2018 [employer]. All rights reserved. The codes documented in this report are preliminary and upon coder review may  be revised to meet current compliance requirements. Barney Drain, MD Barney Drain MD, MD 02/07/2019 9:34:37 AM This report has been signed  electronically. Number of Addenda: 0

## 2019-02-08 ENCOUNTER — Encounter (HOSPITAL_COMMUNITY): Payer: Self-pay | Admitting: Lab

## 2019-02-08 ENCOUNTER — Encounter (HOSPITAL_COMMUNITY): Payer: Self-pay | Admitting: Internal Medicine

## 2019-02-08 ENCOUNTER — Inpatient Hospital Stay (HOSPITAL_BASED_OUTPATIENT_CLINIC_OR_DEPARTMENT_OTHER): Payer: Medicare Other | Admitting: Internal Medicine

## 2019-02-08 VITALS — BP 145/95 | HR 65 | Temp 97.7°F | Resp 18 | Wt 268.0 lb

## 2019-02-08 DIAGNOSIS — R945 Abnormal results of liver function studies: Secondary | ICD-10-CM | POA: Diagnosis not present

## 2019-02-08 DIAGNOSIS — G4733 Obstructive sleep apnea (adult) (pediatric): Secondary | ICD-10-CM | POA: Diagnosis not present

## 2019-02-08 DIAGNOSIS — M2550 Pain in unspecified joint: Secondary | ICD-10-CM

## 2019-02-08 DIAGNOSIS — Z7982 Long term (current) use of aspirin: Secondary | ICD-10-CM

## 2019-02-08 DIAGNOSIS — R972 Elevated prostate specific antigen [PSA]: Secondary | ICD-10-CM | POA: Diagnosis not present

## 2019-02-08 DIAGNOSIS — Z148 Genetic carrier of other disease: Secondary | ICD-10-CM

## 2019-02-08 DIAGNOSIS — E119 Type 2 diabetes mellitus without complications: Secondary | ICD-10-CM | POA: Diagnosis not present

## 2019-02-08 DIAGNOSIS — I10 Essential (primary) hypertension: Secondary | ICD-10-CM

## 2019-02-08 DIAGNOSIS — Z79899 Other long term (current) drug therapy: Secondary | ICD-10-CM | POA: Diagnosis not present

## 2019-02-08 DIAGNOSIS — D751 Secondary polycythemia: Secondary | ICD-10-CM | POA: Diagnosis not present

## 2019-02-08 DIAGNOSIS — Z7984 Long term (current) use of oral hypoglycemic drugs: Secondary | ICD-10-CM

## 2019-02-08 NOTE — Progress Notes (Signed)
Diagnosis Hemochromatosis carrier - Plan: Phlebotomy therapeutic, CBC with Differential, Comprehensive metabolic panel, Lactate dehydrogenase, Ferritin, Iron and TIBC, Transferrin Saturation  Polycythemia, secondary - Plan: Phlebotomy therapeutic, CBC with Differential, Comprehensive metabolic panel, Lactate dehydrogenase, Ferritin, Iron and TIBC, Transferrin Saturation  Staging Cancer Staging No matching staging information was found for the patient.  Assessment and Plan:   1.  Secondary Polycythemia.  68 year old male referred for evaluation due to elevated HCT.  Pt denies smoking history.  He reports he has sleep apnea and uses CPAP.  Pt has family history of an uncle with pancreatic cancer.  He denies any family history of liver problems.  Pt had labs done 12/23/2018 that were reviewed and showed WBC 5.8 HB 18.5 HCT 53 plts 246,000.  Chemistries WNL with Cr 1.16 K+ 4.2 and normal LFTs.  PSA elevated at 7.5.  He denies any headaches or blurred vision.  He reports he is frequently hot.  Denies any skin changes.  He reports joint pain but has undergone knee replacement.  Pt is followed by urology and had prior prostate biopsy that was negative.  Pt is seen today for consultation due to polycythemia.   Lab evaluation done  01/24/2019 reviewed and showed WBC 7.5 HB 18.2 HCT 53 plts 229,000.  He has a normal differential.  Chemistries WNL with K+ 4.2 Cr 1.16 and normal LFTs.  Bilirubin 1.  Ferritin 74 with transferrin saturation of 46.   EPO level normal at 10.  Negative HIV, Jak2 and BCR/AB.  Hemochromatosis gene evaluation shows he has single C282Y gene and is a carrier for hemochromatosis.  Due to HCT of 53 he will undergo two 500 ml phlebotomies.  He will have repeat labs done in 2 months.  Pt is referred to sleep medicine for evaluation as sleep apnea may cause secondary polycythemia.    2.  Hemochromatosis carrier. Gene evaluation done 01/24/2019 shows he has a single C282Y gene.  He is a carrier  for hemochromatosis. This means he may transmit gene to offspring.   I have discussed possible implications of the condition and have provided him written information regarding the diagnosis.  Due to HCT 53 and ferritin of 74 he will undergo two 500 ml phlebotomies but will likely not need repeat phlebotomy if Hct improves and iron levels stay low.  He will have repeat labs in 03/2019.    3  Positive HEP B serology.  Pt has + HEP B Surface ab and + Hep B core ab.  Surface antigen and IGM are negative.  Hep C and HIV negatve.  Pt is immune based on prior exposure.    4.  Elevated PSA.  Pt has PSA of 7.5 .  He is followed by [facility].  He had prostate biopsy done 03/30/2017 with 12 cores submitted that were benign.  Pt should continue to follow-up with urology as directed.   5.  Sleep apnea.  I have discussed with him OSA may also cause secondary polycythemia.  Pt referred for sleep medicine evaluation and management.  Pt reports he uses CPAP.   6.  Joint pain.  Pt has undergone knee surgery.  Pt has normal SPEP, ANA, CRP, RF.  Follow-up with PCP  as recommended.   7.  Hypertension.  BP is 145/95.  Follow-up with PCP.   8.  Health maintenance.  Pt has been seen by GI and had recent colonoscopy done 02/07/2019 that showed Impression:  - One 5 mm polyp in the ascending  colon, removed with a cold snare. Resected and retrieved. - Diverticulosis in the recto-sigmoid colon, in the sigmoid colon and in the ascending colon. - Tortuous colon. - External and internal hemorrhoids  Pathology returned showing tubular adenoma with no evidence of malignancy or dysplasia.    Pt should follow-up with GI as recommended.    25 minutes spent with more than 50% spent in review of records, counseling and coordination of care.    Interval History:  Historical data obtained from note dated 01/24/2019:  68 year old male referred for evaluation due to elevated HCT.  Pt denies smoking history.  He reports he has  sleep apnea and uses CPAP.  Pt has family history of an uncle with pancreatic cancer.  He denies any family history of liver problems.  Pt had labs done 12/23/2018 that were reviewed and showed WBC 5.8 HB 18.5 HCT 53 plts 246,000.  Chemistries WNL with Cr 1.16 K+ 4.2 and normal LFTs.  PSA elevated at 7.5.  He denies any headaches or blurred vision.  He reports he is frequently hot.  Denies any skin changes.  He reports joint pain but has undergone knee replacement.  Pt is followed by urology and had prior prostate biopsy that was negative.  Current Status:  Pt is seen today for follow-up.  He is here to go over labs.   He had recent colonoscopy.    Problem List Patient Active Problem List   Diagnosis Date Noted  . Colon cancer screening [Z12.11]   . History of colonic polyps [Z86.010] 01/23/2019  . History of rectal bleeding [Z87.19] 08/25/2018  . OSA on CPAP [G47.33, Z99.89] 06/21/2017  . Diabetes mellitus type 2 in obese ([place]) [E11.69, E66.9] 10/22/2016  . Elevated PSA [R97.20] 10/22/2016  . Prostatitis, acute [N41.0] 10/28/2015  . Constipation [K59.00] 10/28/2015  . Hypertension associated with diabetes ([place]) [E11.59, I10] 02/03/2011    Past Medical History Past Medical History:  Diagnosis Date  . Diabetes mellitus without complication ([place])   . GERD (gastroesophageal reflux disease)   . Hx of [place] spotted fever 1-1-12011  . Hypertension   . Sleep apnea    wears cpap     Past Surgical History Past Surgical History:  Procedure Laterality Date  . COLONOSCOPY  09/2012   Dr. Britta Mccreedy. Diverticulosis, descending adenomatous colon polyp, hyperplastic rectal polyp. ?mass in anal canal evaluated by surgery and consistent with internal hemorrhoid  . KNEE SURGERY Left 10/2014  . KNEE SURGERY Right 09/2015    Family History Family History  Problem Relation Age of Onset  . Heart disease Mother   . Congestive Heart Failure Mother   . Diabetes Father   . Aneurysm Sister   .  Diabetes Paternal Uncle   . Diabetes Paternal Grandmother   . Colon cancer Neg Hx   . Colon polyps Neg Hx      Social History  reports that he has never smoked. He has never used smokeless tobacco. He reports that he does not drink alcohol or use drugs.  Medications  Current Outpatient Medications:  .  aspirin EC 81 MG tablet, Take 81 mg by mouth every evening., Disp: , Rfl:  .  glucose blood test strip, Use to check blood glucose once daily at varying times.  Dispense Verio One Touch Test strips, Disp: 100 each, Rfl: 4 .  hydrOXYzine (ATARAX/VISTARIL) 50 MG tablet, Take 1 tablet (50 mg total) by mouth at bedtime as needed. (Patient taking differently: Take 50 mg by mouth  at bedtime as needed (allergies/sleep). ), Disp: 30 tablet, Rfl: 3 .  irbesartan (AVAPRO) 150 MG tablet, TAKE 1 TABLET BY MOUTH EVERY DAY (Patient taking differently: Take 150 mg by mouth at bedtime. ), Disp: 30 tablet, Rfl: 5 .  metFORMIN (GLUCOPHAGE XR) 500 MG 24 hr tablet, Take 1 tablet (500 mg total) by mouth daily with breakfast., Disp: 180 tablet, Rfl: 3 .  Multiple Vitamins-Minerals (CENTRUM SILVER 50+MEN PO), Take 1 tablet by mouth daily. Every now and then, Disp: , Rfl:  .  Omega-3 Fatty Acids (FISH OIL) 500 MG CAPS, Take 500 mg by mouth daily., Disp: , Rfl:  .  ONETOUCH DELICA LANCETS 54T MISC, Use to check blood glucose once daily at varying times, Disp: 100 each, Rfl: 4 .  oxymetazoline (AFRIN) 0.05 % nasal spray, Place 1 spray into both nostrils at bedtime as needed for congestion., Disp: , Rfl:  .  Probiotic Product (PROBIOTIC PO), Take 1 capsule by mouth every Monday, Wednesday, and Friday. , Disp: , Rfl:   Allergies Ramipril  Review of Systems Review of Systems - Oncology ROS negative   Physical Exam  Vitals Wt Readings from Last 3 Encounters:  02/08/19 268 lb (121.6 kg)  01/24/19 272 lb 14.4 oz (123.8 kg)  01/23/19 272 lb 9.6 oz (123.7 kg)   Temp Readings from Last 3 Encounters:  02/08/19  97.7 F (36.5 C) (Oral)  02/07/19 97.6 F (36.4 C) (Oral)  01/24/19 98 F (36.7 C) (Oral)   BP Readings from Last 3 Encounters:  02/08/19 (!) 145/95  02/07/19 (!) 146/95  01/24/19 (!) 157/83   Pulse Readings from Last 3 Encounters:  02/08/19 65  02/07/19 62  01/24/19 69   Constitutional: Well-developed, well-nourished, and in no distress.   HENT: Head: Normocephalic and atraumatic.  Mouth/Throat: No oropharyngeal exudate. Mucosa moist. Eyes: Pupils are equal, round, and reactive to light. Conjunctivae are normal. No scleral icterus.  Neck: Normal range of motion. Neck supple. No JVD present.  Cardiovascular: Normal rate, regular rhythm and normal heart sounds.  Exam reveals no gallop and no friction rub.   No murmur heard. Pulmonary/Chest: Effort normal and breath sounds normal. No respiratory distress. No wheezes.No rales.  Abdominal: Soft. Bowel sounds are normal. No distension. There is no tenderness. There is no guarding.  Musculoskeletal: No edema or tenderness.  Lymphadenopathy: No cervical, axillary or supraclavicular adenopathy.  Neurological: Alert and oriented to person, place, and time. No cranial nerve deficit.  Skin: Skin is warm and dry. No rash noted. No erythema. No pallor.  Psychiatric: Affect and judgment normal.   Labs Admission on 02/07/2019, Discharged on 02/07/2019  Component Date Value Ref Range Status  . Glucose-Capillary 02/07/2019 282* 70 - 99 mg/dL Final     Pathology Orders Placed This Encounter  Procedures  . Phlebotomy therapeutic    Standing Status:   Future    Standing Expiration Date:   02/09/2020    Scheduling Instructions:     Please schedule 2 phlebotomies weeks apart  With 1 unit ( 500 ml) removed each time.            2 phlebotomies total  . CBC with Differential    Standing Status:   Future    Standing Expiration Date:   02/09/2020  . Comprehensive metabolic panel    Standing Status:   Future    Standing Expiration Date:    02/09/2020  . Lactate dehydrogenase    Standing Status:   Future    Standing  Expiration Date:   02/09/2020  . Ferritin    Standing Status:   Future    Standing Expiration Date:   02/09/2020  . Iron and TIBC    Standing Status:   Future    Standing Expiration Date:   02/09/2020  . Transferrin Saturation    Standing Status:   Future    Standing Expiration Date:   02/09/2020       Zoila Shutter MD

## 2019-02-08 NOTE — Progress Notes (Unsigned)
Referral sent to Dr Merlene Laughter. Records faxed on 2/26

## 2019-02-09 ENCOUNTER — Encounter (HOSPITAL_COMMUNITY)
Admission: RE | Admit: 2019-02-09 | Discharge: 2019-02-09 | Disposition: A | Discharge status: Home / Self Care | Payer: Medicare Other | Source: Ambulatory Visit | Attending: Internal Medicine | Admitting: Internal Medicine

## 2019-02-09 DIAGNOSIS — D751 Secondary polycythemia: Secondary | ICD-10-CM | POA: Diagnosis not present

## 2019-02-09 DIAGNOSIS — Z148 Genetic carrier of other disease: Secondary | ICD-10-CM | POA: Insufficient documentation

## 2019-02-09 NOTE — Progress Notes (Signed)
[name] presents today for phlebotomy per MD orders. HGB/HCT:18.2/52.9 Phlebotomy procedure started at 1342 and ended at 1347. 504 cc removed. Patient tolerated procedure well. IV needle removed intact from R antecubital.

## 2019-02-09 NOTE — Progress Notes (Signed)
LMOM to call.

## 2019-02-10 ENCOUNTER — Encounter (HOSPITAL_COMMUNITY): Payer: Self-pay | Admitting: Gastroenterology

## 2019-02-10 NOTE — Progress Notes (Signed)
LMOM to call and also mailing a letter to call.

## 2019-02-13 NOTE — Progress Notes (Signed)
CC'D TO PCP AND ON RECALL  °

## 2019-02-23 ENCOUNTER — Encounter (HOSPITAL_COMMUNITY)
Admission: RE | Admit: 2019-02-23 | Discharge: 2019-02-23 | Disposition: A | Discharge status: Home / Self Care | Payer: Medicare Other | Source: Ambulatory Visit | Attending: Internal Medicine | Admitting: Internal Medicine

## 2019-02-23 ENCOUNTER — Other Ambulatory Visit (HOSPITAL_COMMUNITY): Payer: Self-pay | Admitting: *Deleted

## 2019-02-23 ENCOUNTER — Other Ambulatory Visit: Payer: Self-pay

## 2019-02-23 ENCOUNTER — Encounter (HOSPITAL_COMMUNITY): Payer: Self-pay

## 2019-02-23 DIAGNOSIS — Z148 Genetic carrier of other disease: Secondary | ICD-10-CM | POA: Insufficient documentation

## 2019-02-23 DIAGNOSIS — D751 Secondary polycythemia: Secondary | ICD-10-CM

## 2019-02-23 NOTE — Progress Notes (Signed)
Deng Kemler presents today for phlebotomy per MD orders. HGB/HCT:18.2/52.9/ Iron sat 46 Phlebotomy procedure started at 1110 and ended at 1116 523 cc removed. Patient tolerated procedure well.  No distress noted. IV needle removed intact.  Adequate pressure applied and pressure dressing in place.

## 2019-03-23 ENCOUNTER — Encounter: Payer: Self-pay | Admitting: Family Medicine

## 2019-03-23 ENCOUNTER — Other Ambulatory Visit: Payer: Self-pay

## 2019-03-23 ENCOUNTER — Ambulatory Visit (INDEPENDENT_AMBULATORY_CARE_PROVIDER_SITE_OTHER): Payer: Medicare Other | Admitting: Family Medicine

## 2019-03-23 DIAGNOSIS — E1159 Type 2 diabetes mellitus with other circulatory complications: Secondary | ICD-10-CM

## 2019-03-23 DIAGNOSIS — E1169 Type 2 diabetes mellitus with other specified complication: Secondary | ICD-10-CM

## 2019-03-23 DIAGNOSIS — I10 Essential (primary) hypertension: Secondary | ICD-10-CM

## 2019-03-23 DIAGNOSIS — E669 Obesity, unspecified: Secondary | ICD-10-CM

## 2019-03-23 MED ORDER — METFORMIN HCL 1000 MG PO TABS: 1000.0000 mg | ORAL_TABLET | Freq: Two times a day (BID) | ORAL | 3 refills | Status: DC

## 2019-03-23 NOTE — Progress Notes (Signed)
Virtual Visit via telephone Note  I connected with [name] on 03/23/19 at [phone] by telephone and verified that I am speaking with the correct person using two identifiers. [name] is currently located at home and no other people are currently with her during visit. The provider, Fransisca Kaufmann Dettinger, MD is located in their office at time of visit.  Call ended at [place]  I discussed the limitations, risks, security and privacy concerns of performing an evaluation and management service by telephone and the availability of in person appointments. I also discussed with the patient that there may be a patient responsible charge related to this service. The patient expressed understanding and agreed to proceed.   History and Present Illness: Type 2 diabetes mellitus Patient comes in today for recheck of his diabetes. Patient has been currently taking metformin 1000. Patient is currently on an ACE inhibitor/ARB. Patient has not seen an ophthalmologist this year. Patient denies any issues with their feet.   Hypertension Patient is currently on irbesartan, and their blood pressure today is unknown because he does no have a machine. Patient denies any lightheadedness or dizziness. Patient denies headaches, blurred vision, chest pains, shortness of breath, or weakness. Denies any side effects from medication and is content with current medication.   No diagnosis found.  Outpatient Encounter Medications as of 03/23/2019  Medication Sig  . aspirin EC 81 MG tablet Take 81 mg by mouth every evening.  Marland Kitchen glucose blood test strip Use to check blood glucose once daily at varying times.  Dispense Verio One Touch Test strips  . hydrOXYzine (ATARAX/VISTARIL) 50 MG tablet Take 1 tablet (50 mg total) by mouth at bedtime as needed. (Patient taking differently: Take 50 mg by mouth at bedtime as needed (allergies/sleep). )  . irbesartan (AVAPRO) 150 MG tablet TAKE 1 TABLET BY MOUTH EVERY DAY (Patient  taking differently: Take 150 mg by mouth at bedtime. )  . metFORMIN (GLUCOPHAGE XR) 500 MG 24 hr tablet Take 1 tablet (500 mg total) by mouth daily with breakfast.  . Multiple Vitamins-Minerals (CENTRUM SILVER 50+MEN PO) Take 1 tablet by mouth daily. Every now and then  . Omega-3 Fatty Acids (FISH OIL) 500 MG CAPS Take 500 mg by mouth daily.  [name] DELICA LANCETS 50I MISC Use to check blood glucose once daily at varying times  . oxymetazoline (AFRIN) 0.05 % nasal spray Place 1 spray into both nostrils at bedtime as needed for congestion.  . Probiotic Product (PROBIOTIC PO) Take 1 capsule by mouth every Monday, Wednesday, and Friday.    No facility-administered encounter medications on file as of 03/23/2019.     Review of Systems  Constitutional: Negative for chills and fever.  Eyes: Negative for visual disturbance.  Respiratory: Negative for shortness of breath and wheezing.   Cardiovascular: Negative for chest pain and leg swelling.  Musculoskeletal: Negative for back pain and gait problem.  Skin: Negative for rash.  Neurological: Negative for dizziness, weakness, light-headedness and numbness.  All other systems reviewed and are negative.   Observations/Objective: Patient is comfortable and in no acute distress  Assessment and Plan: Problem List Items Addressed This Visit      Cardiovascular and Mediastinum   Hypertension associated with diabetes ([place]) - Primary     Endocrine   Diabetes mellitus type 2 in obese ([place])       Follow Up Instructions:  Follow up in 3 months   I discussed the assessment and treatment plan with the patient.  The patient was provided an opportunity to ask questions and all were answered. The patient agreed with the plan and demonstrated an understanding of the instructions.   The patient was advised to call back or seek an in-person evaluation if the symptoms worsen or if the condition fails to improve as anticipated.  The above assessment and  management plan was discussed with the patient. The patient verbalized understanding of and has agreed to the management plan. Patient is aware to call the clinic if symptoms persist or worsen. Patient is aware when to return to the clinic for a follow-up visit. Patient educated on when it is appropriate to go to the emergency department.    I provided 8 minutes of non-face-to-face time during this encounter.    Worthy Rancher, MD

## 2019-04-04 ENCOUNTER — Other Ambulatory Visit (HOSPITAL_COMMUNITY): Payer: Self-pay | Admitting: *Deleted

## 2019-04-04 DIAGNOSIS — D751 Secondary polycythemia: Secondary | ICD-10-CM

## 2019-04-05 ENCOUNTER — Inpatient Hospital Stay (HOSPITAL_COMMUNITY): Payer: Medicare Other | Attending: Hematology

## 2019-04-05 ENCOUNTER — Other Ambulatory Visit: Payer: Self-pay

## 2019-04-05 DIAGNOSIS — G4730 Sleep apnea, unspecified: Secondary | ICD-10-CM | POA: Insufficient documentation

## 2019-04-05 DIAGNOSIS — R972 Elevated prostate specific antigen [PSA]: Secondary | ICD-10-CM | POA: Diagnosis not present

## 2019-04-05 DIAGNOSIS — Z86010 Personal history of colonic polyps: Secondary | ICD-10-CM | POA: Insufficient documentation

## 2019-04-05 DIAGNOSIS — I10 Essential (primary) hypertension: Secondary | ICD-10-CM | POA: Insufficient documentation

## 2019-04-05 DIAGNOSIS — D119 Benign neoplasm of major salivary gland, unspecified: Secondary | ICD-10-CM | POA: Diagnosis not present

## 2019-04-05 DIAGNOSIS — B1910 Unspecified viral hepatitis B without hepatic coma: Secondary | ICD-10-CM | POA: Insufficient documentation

## 2019-04-05 DIAGNOSIS — Z79899 Other long term (current) drug therapy: Secondary | ICD-10-CM | POA: Diagnosis not present

## 2019-04-05 DIAGNOSIS — K219 Gastro-esophageal reflux disease without esophagitis: Secondary | ICD-10-CM | POA: Diagnosis not present

## 2019-04-05 DIAGNOSIS — Z7984 Long term (current) use of oral hypoglycemic drugs: Secondary | ICD-10-CM | POA: Insufficient documentation

## 2019-04-05 DIAGNOSIS — D751 Secondary polycythemia: Secondary | ICD-10-CM | POA: Insufficient documentation

## 2019-04-05 DIAGNOSIS — Z7982 Long term (current) use of aspirin: Secondary | ICD-10-CM | POA: Diagnosis not present

## 2019-04-05 DIAGNOSIS — E119 Type 2 diabetes mellitus without complications: Secondary | ICD-10-CM | POA: Insufficient documentation

## 2019-04-05 LAB — CBC WITH DIFFERENTIAL/PLATELET
Abs Immature Granulocytes: 0.03 10*3/uL (ref 0.00–0.07)
Basophils Absolute: 0.1 10*3/uL (ref 0.0–0.1)
Basophils Relative: 1 %
Eosinophils Absolute: 0.1 10*3/uL (ref 0.0–0.5)
Eosinophils Relative: 2 %
HCT: 50.5 % (ref 39.0–52.0)
Hemoglobin: 17.9 g/dL — ABNORMAL HIGH (ref 13.0–17.0)
Immature Granulocytes: 0 %
Lymphocytes Relative: 22 %
Lymphs Abs: 1.5 10*3/uL (ref 0.7–4.0)
MCH: 33.2 pg (ref 26.0–34.0)
MCHC: 35.4 g/dL (ref 30.0–36.0)
MCV: 93.7 fL (ref 80.0–100.0)
Monocytes Absolute: 0.5 10*3/uL (ref 0.1–1.0)
Monocytes Relative: 7 %
Neutro Abs: 4.6 10*3/uL (ref 1.7–7.7)
Neutrophils Relative %: 68 %
Platelets: 248 10*3/uL (ref 150–400)
RBC: 5.39 MIL/uL (ref 4.22–5.81)
RDW: 12.7 % (ref 11.5–15.5)
WBC: 6.7 10*3/uL (ref 4.0–10.5)
nRBC: 0 % (ref 0.0–0.2)

## 2019-04-05 LAB — COMPREHENSIVE METABOLIC PANEL
ALT: 27 U/L (ref 0–44)
AST: 26 U/L (ref 15–41)
Albumin: 4.1 g/dL (ref 3.5–5.0)
Alkaline Phosphatase: 91 U/L (ref 38–126)
Anion gap: 12 (ref 5–15)
BUN: 20 mg/dL (ref 8–23)
CO2: 25 mmol/L (ref 22–32)
Calcium: 9.5 mg/dL (ref 8.9–10.3)
Chloride: 98 mmol/L (ref 98–111)
Creatinine, Ser: 1.23 mg/dL (ref 0.61–1.24)
GFR calc Af Amer: >60 mL/min (ref >60)
GFR calc non Af Amer: >60 mL/min (ref >60)
Glucose, Bld: 340 mg/dL — ABNORMAL HIGH (ref 70–99)
Potassium: 4.7 mmol/L (ref 3.5–5.1)
Sodium: 135 mmol/L (ref 135–145)
Total Bilirubin: 1.3 mg/dL — ABNORMAL HIGH (ref 0.3–1.2)
Total Protein: 7.7 g/dL (ref 6.5–8.1)

## 2019-04-05 LAB — LACTATE DEHYDROGENASE: LDH: 144 U/L (ref 98–192)

## 2019-04-05 LAB — FERRITIN: Ferritin: 48 ng/mL (ref 24–336)

## 2019-04-05 LAB — IRON AND TIBC
Iron: 125 ug/dL (ref 45–182)
Saturation Ratios: 34 % (ref 17.9–39.5)
TIBC: 372 ug/dL (ref 250–450)
UIBC: 247 ug/dL

## 2019-04-12 ENCOUNTER — Other Ambulatory Visit: Payer: Self-pay

## 2019-04-12 ENCOUNTER — Inpatient Hospital Stay (HOSPITAL_BASED_OUTPATIENT_CLINIC_OR_DEPARTMENT_OTHER): Payer: Medicare Other | Admitting: Nurse Practitioner

## 2019-04-12 VITALS — BP 147/79 | HR 64 | Temp 97.8°F | Resp 18 | Wt 265.2 lb

## 2019-04-12 DIAGNOSIS — B1910 Unspecified viral hepatitis B without hepatic coma: Secondary | ICD-10-CM

## 2019-04-12 DIAGNOSIS — G4730 Sleep apnea, unspecified: Secondary | ICD-10-CM

## 2019-04-12 DIAGNOSIS — E119 Type 2 diabetes mellitus without complications: Secondary | ICD-10-CM | POA: Diagnosis not present

## 2019-04-12 DIAGNOSIS — Z86010 Personal history of colonic polyps: Secondary | ICD-10-CM

## 2019-04-12 DIAGNOSIS — D751 Secondary polycythemia: Secondary | ICD-10-CM

## 2019-04-12 DIAGNOSIS — K219 Gastro-esophageal reflux disease without esophagitis: Secondary | ICD-10-CM

## 2019-04-12 DIAGNOSIS — I10 Essential (primary) hypertension: Secondary | ICD-10-CM | POA: Diagnosis not present

## 2019-04-12 DIAGNOSIS — Z7984 Long term (current) use of oral hypoglycemic drugs: Secondary | ICD-10-CM | POA: Diagnosis not present

## 2019-04-12 DIAGNOSIS — D119 Benign neoplasm of major salivary gland, unspecified: Secondary | ICD-10-CM

## 2019-04-12 DIAGNOSIS — Z79899 Other long term (current) drug therapy: Secondary | ICD-10-CM

## 2019-04-12 DIAGNOSIS — Z7982 Long term (current) use of aspirin: Secondary | ICD-10-CM

## 2019-04-12 DIAGNOSIS — R972 Elevated prostate specific antigen [PSA]: Secondary | ICD-10-CM | POA: Diagnosis not present

## 2019-04-12 NOTE — Assessment & Plan Note (Signed)
1.  Secondary polycythemia: - Patient reports he clotted when he would go to donate blood.  He was always hot and very thirsty.  PCP noticed his hematocrit and hemoglobin were elevated and sent him to hematology. - His blood work was evaluated he was negative for HIV, Jak 2 and BCR/ABL.  EPO level was normal at 10.  SPEP, ANA, CRP, RF were all normal. - Hemochromatosis gene evaluation showed he had a single C282Y gene and is a carrier for hemochromatosis. - He also reports he has sleep apnea and uses a CPAP machine at night.  He was referred for another sleep study evaluation.  But they canceled his appointment due to the COVID crisis. -Patient denies smoking history.  Denies any headaches or blurred vision.  Denies any skin changes. - Patient had his first 2 500 mL phlebotomies on 02/09/2019 in 02/23/2019.  He reports he is not as hot or thirsty since the phlebotomies. - Labs on 04/05/2019 showed hemoglobin 17.9, ferritin 48, hematocrit 50.5, platelets 248. - We will set him up for 1 phlebotomy on 04/13/2019.  To maintain his hematocrit around 45. - He will follow-up with [place] in 2 months with labs.  2.  Sleep apnea: - We have discussed with the patient that obstructive sleep apnea may cause secondary polycythemia. - Patient has been referred for a sleep study evaluation. - The appointment was canceled due to the [place] crisis we will reschedule after. -Patient reports he uses a CPAP machine at night  3.  Positive hep B serology: - Patient has positive hep B surface antibody and positive hep B core antibody. Surface antigen and IgM are negative.  Hep C and HIV are negative.  Patient is immune based on prior exposure.  4.  Elevated PSA: -Patient has a PSA of 7.5. -[facility] -He had a prostate biopsy on 03/30/2017 with 12 core submitted that were benign. -Patient will continue to follow-up with urology as directed.  5.  Health maintenance: -Patient had a colonoscopy on  02/07/2019 that showed a 5 mm polyp in the ascending colon that showed tubular adenoma.  Diverticulosis in the rectosigmoid colon and the ascending colon.  Torturous colon.  External and internal hemorrhoids. -Patient will follow-up with GI as recommended.

## 2019-04-12 NOTE — Progress Notes (Signed)
[address], [place] [address]   CLINIC:  Medical Oncology/Hematology  PCP:  Dettinger, Fransisca Kaufmann, MD [place] [id number] [phone]   REASON FOR VISIT: Follow-up for secondary polycythemia  CURRENT THERAPY: Intermittent phlebotomies   INTERVAL HISTORY:  [name] 68 y.o. male returns for routine follow-up for secondary polycythemia.  Patient reports he has been doing well since his last visit.  Patient reports that he had 2 phlebotomies and he was not as hot or thirsty after them and has felt better and had more energy.  He is starting to get those symptoms back again.  He reports he is wearing his CPAP at night and needs to reschedule his sleep study appointment due to the [place]. Denies any nausea, vomiting, or diarrhea. Denies any new pains. Had not noticed any recent bleeding such as epistaxis, hematuria or hematochezia. Denies recent chest pain on exertion, shortness of breath on minimal exertion, pre-syncopal episodes, or palpitations. Denies any numbness or tingling in hands or feet. Denies any recent fevers, infections, or recent hospitalizations. Patient reports appetite at 100% and energy level at 100%.  He is eating well and maintaining his weight at this time.    REVIEW OF SYSTEMS:  Review of Systems  Constitutional:       Thirsty  Endocrine: Positive for hot flashes.  Psychiatric/Behavioral: Positive for sleep disturbance.  All other systems reviewed and are negative.    PAST MEDICAL/SURGICAL HISTORY:  Past Medical History:  Diagnosis Date  . Diabetes mellitus without complication ([facility])   . GERD (gastroesophageal reflux disease)   . Hx of [place] spotted fever 1-1-12011  . Hypertension   . Sleep apnea    wears cpap    Past Surgical History:  Procedure Laterality Date  . COLONOSCOPY  09/2012   Dr. Britta Mccreedy. Diverticulosis, descending adenomatous colon polyp, hyperplastic rectal polyp. ?mass in anal  canal evaluated by surgery and consistent with internal hemorrhoid  . COLONOSCOPY WITH PROPOFOL N/A 02/07/2019   Procedure: COLONOSCOPY WITH PROPOFOL;  Surgeon: Danie Binder, MD;  [facility]: [facility];  Service: Endoscopy;  Laterality: N/A;  9:30am  . KNEE SURGERY Left 10/2014  . KNEE SURGERY Right 09/2015  . POLYPECTOMY  02/07/2019   Procedure: POLYPECTOMY;  Surgeon: Danie Binder, MD;  Location: [facility];  Service: Endoscopy;;     SOCIAL HISTORY:  Social History   Socioeconomic History  . Marital status: Single    Spouse name: Not on file  . Number of children: Not on file  . Years of education: Not on file  . Highest education level: 12th grade  Occupational History  . Occupation: Retired    Comment: Pensions consultant and gam[employer]  Social Needs  . Financial resource strain: Not hard at all  . Food insecurity:    Worry: Never true    Inability: Never true  . Transportation needs:    Medical: No    Non-medical: No  Tobacco Use  . Smoking status: Never Smoker  . Smokeless tobacco: Never Used  Substance and Sexual Activity  . Alcohol use: No  . Drug use: No  . Sexual activity: Not Currently  Lifestyle  . Physical activity:    Days per week: 7 days    Minutes per session: 50 min  . Stress: Not at all  Relationships  . Social connections:    Talks on phone: More than three times a week    Gets together: More  than three times a week    Attends religious service: More than 4 times per year    Active member of club or organization: Yes    Attends meetings of clubs or organizations: More than 4 times per year    Relationship status: Never married  . Intimate partner violence:    Fear of current or ex partner: No    Emotionally abused: No    Physically abused: No    Forced sexual activity: No  Other Topics Concern  . Not on file  Social History Narrative   WORKED AT [employer] > 10 YRS. NOT MARRIED. FUR BABIES: 3 DOGS AND 5 CATS. SPENDS FREE TIME: [place], [place].    FAMILY HISTORY:  Family History  Problem Relation Age of Onset  . Heart disease Mother   . Congestive Heart Failure Mother   . Diabetes Father   . Aneurysm Sister   . Diabetes Paternal Uncle   . Diabetes Paternal Grandmother   . Colon cancer Neg Hx   . Colon polyps Neg Hx     CURRENT MEDICATIONS:  Outpatient Encounter Medications as of 04/12/2019  Medication Sig  . aspirin EC 81 MG tablet Take 81 mg by mouth every evening.  Marland Kitchen glucose blood test strip Use to check blood glucose once daily at varying times.  Dispense Verio One Touch Test strips  . hydrOXYzine (ATARAX/VISTARIL) 50 MG tablet Take 1 tablet (50 mg total) by mouth at bedtime as needed. (Patient taking differently: Take 50 mg by mouth at bedtime as needed (allergies/sleep). )  . irbesartan (AVAPRO) 150 MG tablet TAKE 1 TABLET BY MOUTH EVERY DAY (Patient taking differently: Take 150 mg by mouth at bedtime. )  . metFORMIN (GLUCOPHAGE) 1000 MG tablet Take 1 tablet (1,000 mg total) by mouth 2 (two) times daily with a meal.  . Multiple Vitamins-Minerals (CENTRUM SILVER 50+MEN PO) Take 1 tablet by mouth daily. Every now and then  . ONETOUCH DELICA LANCETS 70J MISC Use to check blood glucose once daily at varying times  . oxymetazoline (AFRIN) 0.05 % nasal spray Place 1 spray into both nostrils at bedtime as needed for congestion.  . Probiotic Product (PROBIOTIC PO) Take 1 capsule by mouth once a week.   . Omega-3 Fatty Acids (FISH OIL) 500 MG CAPS Take 500 mg by mouth daily.  . [DISCONTINUED] hydrOXYzine (VISTARIL) 50 MG capsule Take 50 mg by mouth at bedtime as needed.   No facility-administered encounter medications on file as of 04/12/2019.     ALLERGIES:  Allergies  Allergen Reactions  . Ramipril Cough     PHYSICAL EXAM:  ECOG Performance status: 1  Vitals:   04/12/19 1016  BP: (!) 147/79  Pulse: 64  Resp: 18  Temp: 97.8 F (36.6 C)  SpO2: 96%   Filed Weights   04/12/19 1016  Weight:  265 lb 3.2 oz (120.3 kg)    Physical Exam Constitutional:      Appearance: Normal appearance. He is normal weight.  Cardiovascular:     Rate and Rhythm: Normal rate and regular rhythm.     Heart sounds: Normal heart sounds.  Pulmonary:     Effort: Pulmonary effort is normal.     Breath sounds: Normal breath sounds.  Abdominal:     General: Bowel sounds are normal.     Palpations: Abdomen is soft.  Musculoskeletal: Normal range of motion.  Skin:    General: Skin is warm and dry.  Neurological:  Mental Status: He is alert and oriented to person, place, and time. Mental status is at baseline.  Psychiatric:        Mood and Affect: Mood normal.        Behavior: Behavior normal.        Thought Content: Thought content normal.        Judgment: Judgment normal.      LABORATORY DATA:  I have reviewed the labs as listed.  CBC    Component Value Date/Time   WBC 6.7 04/05/2019 1058   RBC 5.39 04/05/2019 1058   HGB 17.9 (H) 04/05/2019 1058   HGB 18.5 (H) 12/23/2018 0804   HCT 50.5 04/05/2019 1058   HCT 53.3 (H) 12/23/2018 0804   PLT 248 04/05/2019 1058   PLT 246 12/23/2018 0804   MCV 93.7 04/05/2019 1058   MCV 95 12/23/2018 0804   MCH 33.2 04/05/2019 1058   MCHC 35.4 04/05/2019 1058   RDW 12.7 04/05/2019 1058   RDW 12.6 12/23/2018 0804   LYMPHSABS 1.5 04/05/2019 1058   LYMPHSABS 1.4 12/23/2018 0804   MONOABS 0.5 04/05/2019 1058   EOSABS 0.1 04/05/2019 1058   EOSABS 0.1 12/23/2018 0804   BASOSABS 0.1 04/05/2019 1058   BASOSABS 0.1 12/23/2018 0804   CMP Latest Ref Rng & Units 04/05/2019 01/24/2019 12/23/2018  Glucose 70 - 99 mg/dL 340(H) 210(H) 146(H)  BUN 8 - 23 mg/dL 20 21 13   Creatinine 0.61 - 1.24 mg/dL 1.23 1.16 1.16  Sodium 135 - 145 mmol/L 135 136 138  Potassium 3.5 - 5.1 mmol/L 4.7 4.2 4.2  Chloride 98 - 111 mmol/L 98 105 98  CO2 22 - 32 mmol/L 25 22 25   Calcium 8.9 - 10.3 mg/dL 9.5 9.2 9.2  Total Protein 6.5 - 8.1 g/dL 7.7 7.6 6.0  Total Bilirubin 0.3 - 1.2  mg/dL 1.3(H) 1.0 1.1  Alkaline Phos 38 - 126 U/L 91 78 73  AST 15 - 41 U/L 26 24 27   ALT 0 - 44 U/L 27 32 30    I personally performed a face-to-face visit.  All questions were answered to patient's stated satisfaction. Encouraged patient to call with any new concerns or questions before his next visit to the cancer center and we can certain see him sooner, if needed.     ASSESSMENT & PLAN:   Polycythemia, secondary 1.  Secondary polycythemia: - Patient reports he clotted when he would go to donate blood.  He was always hot and very thirsty.  PCP noticed his hematocrit and hemoglobin were elevated and sent him to hematology. - His blood work was evaluated he was negative for HIV, Jak 2 and BCR/ABL.  EPO level was normal at 10.  SPEP, ANA, CRP, RF were all normal. - Hemochromatosis gene evaluation showed he had a single C282Y gene and is a carrier for hemochromatosis. - He also reports he has sleep apnea and uses a CPAP machine at night.  He was referred for another sleep study evaluation.  But they canceled his appointment due to the COVID crisis. -Patient denies smoking history.  Denies any headaches or blurred vision.  Denies any skin changes. - Patient had his first 2 500 mL phlebotomies on 02/09/2019 in 02/23/2019.  He reports he is not as hot or thirsty since the phlebotomies. - Labs on 04/05/2019 showed hemoglobin 17.9, ferritin 48, hematocrit 50.5, platelets 248. - We will set him up for 1 phlebotomy on 04/13/2019.  To maintain his hematocrit around 45. - He will follow-up  with [place] in 2 months with labs.  2.  Sleep apnea: - We have discussed with the patient that obstructive sleep apnea may cause secondary polycythemia. - Patient has been referred for a sleep study evaluation. - The appointment was canceled due to the [place] crisis we will reschedule after. -Patient reports he uses a CPAP machine at night  3.  Positive hep B serology: - Patient has positive hep B surface antibody and  positive hep B core antibody. Surface antigen and IgM are negative.  Hep C and HIV are negative.  Patient is immune based on prior exposure.  4.  Elevated PSA: -Patient has a PSA of 7.5. -He is followed by advanced urology. -He had a prostate biopsy on 03/30/2017 with 12 core submitted that were benign. -Patient will continue to follow-up with urology as directed.  5.  Health maintenance: -Patient had a colonoscopy on 02/07/2019 that showed a 5 mm polyp in the ascending colon that showed tubular adenoma.  Diverticulosis in the rectosigmoid colon and the ascending colon.  Torturous colon.  External and internal hemorrhoids. -Patient will follow-up with GI as recommended.      Orders placed this encounter:  Orders Placed This Encounter  Procedures  . Phlebotomy therapeutic  . Phlebotomy therapeutic  . Lactate dehydrogenase  . CBC with Differential/Platelet  . Comprehensive metabolic panel  . Ferritin  . Iron and TIBC  . Vitamin B12  . VITAMIN D 25 Hydroxy (Vit-D Deficiency, Fractures)  . Folate     Francene Finders, FNP-C [place] [phone]

## 2019-04-12 NOTE — Patient Instructions (Signed)
[place] at [facility] Discharge Instructions Go to lab for 1 phlebotomy today. Follow-up in 2 months with repeat labs.   Thank you for choosing [place] at [facility] to provide your oncology and hematology care.  To afford each patient quality time with our provider, please arrive at least 15 minutes before your scheduled appointment time.   If you have a lab appointment with the [facility] please come in thru the  Main Entrance and check in at the main information desk  You need to re-schedule your appointment should you arrive 10 or more minutes late.  We strive to give you quality time with our providers, and arriving late affects you and other patients whose appointments are after yours.  Also, if you no show three or more times for appointments you may be dismissed from the clinic at the providers discretion.     Again, thank you for choosing [facility].  Our hope is that these requests will decrease the amount of time that you wait before being seen by our physicians.       _____________________________________________________________  Should you have questions after your visit to [facility], please contact our office at [phone] between the hours of 8:00 a.m. and 4:30 p.m.  Voicemails left after 4:00 p.m. will not be returned until the following business day.  For prescription refill requests, have your pharmacy contact our office and allow 72 hours.    Cancer Center Support Programs:   > Cancer Support Group  2nd Tuesday of the month 1pm-2pm, Journey Room

## 2019-04-12 NOTE — Addendum Note (Signed)
Addended by: Glennie Isle on: 04/12/2019 01:42 PM   Modules accepted: Orders

## 2019-04-13 ENCOUNTER — Other Ambulatory Visit: Payer: Self-pay

## 2019-04-13 ENCOUNTER — Encounter (HOSPITAL_COMMUNITY): Payer: Self-pay

## 2019-04-13 ENCOUNTER — Inpatient Hospital Stay (HOSPITAL_COMMUNITY): Payer: Medicare Other

## 2019-04-13 DIAGNOSIS — K219 Gastro-esophageal reflux disease without esophagitis: Secondary | ICD-10-CM | POA: Diagnosis not present

## 2019-04-13 DIAGNOSIS — R972 Elevated prostate specific antigen [PSA]: Secondary | ICD-10-CM | POA: Diagnosis not present

## 2019-04-13 DIAGNOSIS — B1910 Unspecified viral hepatitis B without hepatic coma: Secondary | ICD-10-CM | POA: Diagnosis not present

## 2019-04-13 DIAGNOSIS — D119 Benign neoplasm of major salivary gland, unspecified: Secondary | ICD-10-CM | POA: Diagnosis not present

## 2019-04-13 DIAGNOSIS — D751 Secondary polycythemia: Secondary | ICD-10-CM | POA: Diagnosis not present

## 2019-04-13 NOTE — Progress Notes (Signed)
[name] presents today for phlebotomy per MD orders. Phlebotomy procedure started at 1410 and ended at 1416.  500 cc removed. Patient tolerated procedure well. IV needle removed intact.  Insertion site clean and dry with no bruising or swelling noted at site.  Gauze and paper tape used for dressing.   Patient denied dizziness or weakness.   VSs with discharge and left ambulatory with no s/s of distress noted.

## 2019-04-13 NOTE — Patient Instructions (Signed)
[facility] at [facility]  Discharge Instructions:   _______________________________________________________________  Thank you for choosing [facility] at [facility] to provide your oncology and hematology care.  To afford each patient quality time with our providers, please arrive at least 15 minutes before your scheduled appointment.  You need to re-schedule your appointment if you arrive 10 or more minutes late.  We strive to give you quality time with our providers, and arriving late affects you and other patients whose appointments are after yours.  Also, if you no show three or more times for appointments you may be dismissed from the clinic.  Again, thank you for choosing [facility] at [facility]. Our hope is that these requests will allow you access to exceptional care and in a timely manner. _______________________________________________________________  If you have questions after your visit, please contact our office at [phone] between the hours of 8:30 a.m. and 5:00 p.m. Voicemails left after 4:30 p.m. will not be returned until the following business day. _______________________________________________________________  For prescription refill requests, have your pharmacy contact our office. _______________________________________________________________  Recommendations made by the consultant and any test results will be sent to your referring physician. _______________________________________________________________ 

## 2019-04-14 NOTE — Progress Notes (Signed)
Patient to receive 1 additional 500cc phlebotomy next week 04/20/19 per Francene Finders NP.  Patient given appt date and itme.

## 2019-04-20 ENCOUNTER — Encounter (HOSPITAL_COMMUNITY): Payer: Self-pay

## 2019-04-20 ENCOUNTER — Inpatient Hospital Stay (HOSPITAL_COMMUNITY): Payer: Medicare Other | Attending: Internal Medicine

## 2019-04-20 ENCOUNTER — Other Ambulatory Visit: Payer: Self-pay

## 2019-04-20 DIAGNOSIS — D751 Secondary polycythemia: Secondary | ICD-10-CM | POA: Insufficient documentation

## 2019-04-20 NOTE — Progress Notes (Signed)
[name] presents today for phlebotomy per MD orders. Phlebotomy procedure started at 1409 and ended at 1451. 500 cc removed. Patient tolerated procedure well. IV needle removed intact.   Vital signs checked with no complaints voiced.  Right arm site clean and dry with dressing applied.  Patient left ambulatory after drinking water and denied pain, dizziness, or SOB.  Left ambulatory with no s/s of distress noted.

## 2019-04-20 NOTE — Patient Instructions (Signed)
[facility] at [facility]  Discharge Instructions:   _______________________________________________________________  Thank you for choosing [facility] at [facility] to provide your oncology and hematology care.  To afford each patient quality time with our providers, please arrive at least 15 minutes before your scheduled appointment.  You need to re-schedule your appointment if you arrive 10 or more minutes late.  We strive to give you quality time with our providers, and arriving late affects you and other patients whose appointments are after yours.  Also, if you no show three or more times for appointments you may be dismissed from the clinic.  Again, thank you for choosing [facility] at [facility]. Our hope is that these requests will allow you access to exceptional care and in a timely manner. _______________________________________________________________  If you have questions after your visit, please contact our office at [phone] between the hours of 8:30 a.m. and 5:00 p.m. Voicemails left after 4:30 p.m. will not be returned until the following business day. _______________________________________________________________  For prescription refill requests, have your pharmacy contact our office. _______________________________________________________________  Recommendations made by the consultant and any test results will be sent to your referring physician. _______________________________________________________________ 

## 2019-05-12 ENCOUNTER — Telehealth: Payer: Self-pay | Admitting: Family Medicine

## 2019-06-12 ENCOUNTER — Inpatient Hospital Stay (HOSPITAL_COMMUNITY): Payer: Medicare Other | Attending: Hematology

## 2019-06-12 ENCOUNTER — Telehealth (HOSPITAL_COMMUNITY): Payer: Self-pay | Admitting: *Deleted

## 2019-06-12 ENCOUNTER — Telehealth: Payer: Self-pay | Admitting: *Deleted

## 2019-06-12 ENCOUNTER — Other Ambulatory Visit: Payer: Self-pay

## 2019-06-12 DIAGNOSIS — R972 Elevated prostate specific antigen [PSA]: Secondary | ICD-10-CM | POA: Diagnosis not present

## 2019-06-12 DIAGNOSIS — R5383 Other fatigue: Secondary | ICD-10-CM | POA: Insufficient documentation

## 2019-06-12 DIAGNOSIS — G4730 Sleep apnea, unspecified: Secondary | ICD-10-CM | POA: Diagnosis not present

## 2019-06-12 DIAGNOSIS — Z7984 Long term (current) use of oral hypoglycemic drugs: Secondary | ICD-10-CM | POA: Insufficient documentation

## 2019-06-12 DIAGNOSIS — D751 Secondary polycythemia: Secondary | ICD-10-CM | POA: Diagnosis not present

## 2019-06-12 DIAGNOSIS — I10 Essential (primary) hypertension: Secondary | ICD-10-CM | POA: Diagnosis not present

## 2019-06-12 DIAGNOSIS — Z79899 Other long term (current) drug therapy: Secondary | ICD-10-CM | POA: Diagnosis not present

## 2019-06-12 DIAGNOSIS — E119 Type 2 diabetes mellitus without complications: Secondary | ICD-10-CM | POA: Insufficient documentation

## 2019-06-12 DIAGNOSIS — R05 Cough: Secondary | ICD-10-CM | POA: Diagnosis not present

## 2019-06-12 DIAGNOSIS — Z7982 Long term (current) use of aspirin: Secondary | ICD-10-CM | POA: Diagnosis not present

## 2019-06-12 DIAGNOSIS — K219 Gastro-esophageal reflux disease without esophagitis: Secondary | ICD-10-CM | POA: Diagnosis not present

## 2019-06-12 LAB — CBC WITH DIFFERENTIAL/PLATELET
Abs Immature Granulocytes: 0.02 10*3/uL (ref 0.00–0.07)
Basophils Absolute: 0.1 10*3/uL (ref 0.0–0.1)
Basophils Relative: 1 %
Eosinophils Absolute: 0.2 10*3/uL (ref 0.0–0.5)
Eosinophils Relative: 3 %
HCT: 48.9 % (ref 39.0–52.0)
Hemoglobin: 17.3 g/dL — ABNORMAL HIGH (ref 13.0–17.0)
Immature Granulocytes: 0 %
Lymphocytes Relative: 24 %
Lymphs Abs: 1.4 10*3/uL (ref 0.7–4.0)
MCH: 32.4 pg (ref 26.0–34.0)
MCHC: 35.4 g/dL (ref 30.0–36.0)
MCV: 91.6 fL (ref 80.0–100.0)
Monocytes Absolute: 0.5 10*3/uL (ref 0.1–1.0)
Monocytes Relative: 9 %
Neutro Abs: 3.7 10*3/uL (ref 1.7–7.7)
Neutrophils Relative %: 63 %
Platelets: 213 10*3/uL (ref 150–400)
RBC: 5.34 MIL/uL (ref 4.22–5.81)
RDW: 11.9 % (ref 11.5–15.5)
WBC: 5.8 10*3/uL (ref 4.0–10.5)
nRBC: 0 % (ref 0.0–0.2)

## 2019-06-12 LAB — COMPREHENSIVE METABOLIC PANEL
ALT: 31 U/L (ref 0–44)
AST: 26 U/L (ref 15–41)
Albumin: 3.9 g/dL (ref 3.5–5.0)
Alkaline Phosphatase: 146 U/L — ABNORMAL HIGH (ref 38–126)
Anion gap: 14 (ref 5–15)
BUN: 32 mg/dL — ABNORMAL HIGH (ref 8–23)
CO2: 22 mmol/L (ref 22–32)
Calcium: 9.7 mg/dL (ref 8.9–10.3)
Chloride: 92 mmol/L — ABNORMAL LOW (ref 98–111)
Creatinine, Ser: 1.29 mg/dL — ABNORMAL HIGH (ref 0.61–1.24)
GFR calc Af Amer: >60 mL/min (ref >60)
GFR calc non Af Amer: 57 mL/min — ABNORMAL LOW (ref >60)
Glucose, Bld: 693 mg/dL (ref 70–99)
Potassium: 4.9 mmol/L (ref 3.5–5.1)
Sodium: 128 mmol/L — ABNORMAL LOW (ref 135–145)
Total Bilirubin: 1 mg/dL (ref 0.3–1.2)
Total Protein: 7.6 g/dL (ref 6.5–8.1)

## 2019-06-12 LAB — IRON AND TIBC
Iron: 131 ug/dL (ref 45–182)
Saturation Ratios: 34 % (ref 17.9–39.5)
TIBC: 385 ug/dL (ref 250–450)
UIBC: 254 ug/dL

## 2019-06-12 LAB — LACTATE DEHYDROGENASE: LDH: 131 U/L (ref 98–192)

## 2019-06-12 LAB — HEMOGLOBIN A1C
Hgb A1c MFr Bld: 12.3 % — ABNORMAL HIGH (ref 4.8–5.6)
Mean Plasma Glucose: 306.31 mg/dL

## 2019-06-12 LAB — VITAMIN B12: Vitamin B-12: 385 pg/mL (ref 180–914)

## 2019-06-12 LAB — FERRITIN: Ferritin: 33 ng/mL (ref 24–336)

## 2019-06-12 LAB — FOLATE: Folate: 13.6 ng/mL (ref >5.9)

## 2019-06-12 NOTE — Telephone Encounter (Signed)
Incoming call from pt  Pt received call from Oncology stating pt's BS was > 600 Pt was told to go to ER Pt wanted to come to PCP office Per Dr Dettinger pt need to go to ER for hyperglycemia and possible dehydration Pt informed of recommendation Verbalizes understanding

## 2019-06-12 NOTE — Telephone Encounter (Signed)
Phoned pt regarding his critically high blood glucose, but was unable to reach him.  Detailed voice mail message left on his phone advising of his critically high blood sugar and his need to be evaluated in the ED.  Also called Girtha Hake, listed in pt's contacts.  I spoke with Mariann Laster and advised her of the above, and she states that she will call (and keep calling) Mr. Weber until she gets in touch with him to let him know.  Mr. Kampf eventually returned my call, stating he was outside in his garden when I called earlier.  He denies any s/s related to his hyperglycemia.  States, "I feel great".  He reports he also spoke with his PCP after receiving my voicemail message, who also advised pt go to the ED.  Pt is reluctant to go to the ED at this time since he is feeling well.  Instructed pt that given how high his blood sugar is, it would really be best for him to go to the ED to be evaluated.  He states that he will check his blood sugar "in a couple of hours" and agrees to go to the ED if it still critically high.  He is scheduled to see Cristela Felt, NP in our clinic tomorrow and she was notified of all of the above.

## 2019-06-12 NOTE — Progress Notes (Signed)
CRITICAL VALUE ALERT  Critical Value:  Blood glucose 693  Date & Time Notied:  06/12/2019 at 1020  Provider Notified: Cristela Felt, NP  Orders Received/Actions taken: contact pt and advise to report to the emergency department for further evaluation and management of hyperglycemia.

## 2019-06-13 ENCOUNTER — Inpatient Hospital Stay (HOSPITAL_BASED_OUTPATIENT_CLINIC_OR_DEPARTMENT_OTHER): Payer: Medicare Other | Admitting: Nurse Practitioner

## 2019-06-13 DIAGNOSIS — Z79899 Other long term (current) drug therapy: Secondary | ICD-10-CM | POA: Diagnosis not present

## 2019-06-13 DIAGNOSIS — I10 Essential (primary) hypertension: Secondary | ICD-10-CM

## 2019-06-13 DIAGNOSIS — G4730 Sleep apnea, unspecified: Secondary | ICD-10-CM | POA: Diagnosis not present

## 2019-06-13 DIAGNOSIS — R5383 Other fatigue: Secondary | ICD-10-CM | POA: Diagnosis not present

## 2019-06-13 DIAGNOSIS — E119 Type 2 diabetes mellitus without complications: Secondary | ICD-10-CM | POA: Diagnosis not present

## 2019-06-13 DIAGNOSIS — Z7982 Long term (current) use of aspirin: Secondary | ICD-10-CM | POA: Diagnosis not present

## 2019-06-13 DIAGNOSIS — Z7984 Long term (current) use of oral hypoglycemic drugs: Secondary | ICD-10-CM

## 2019-06-13 DIAGNOSIS — R972 Elevated prostate specific antigen [PSA]: Secondary | ICD-10-CM

## 2019-06-13 DIAGNOSIS — K219 Gastro-esophageal reflux disease without esophagitis: Secondary | ICD-10-CM

## 2019-06-13 DIAGNOSIS — D751 Secondary polycythemia: Secondary | ICD-10-CM | POA: Diagnosis not present

## 2019-06-13 DIAGNOSIS — R05 Cough: Secondary | ICD-10-CM | POA: Diagnosis not present

## 2019-06-13 LAB — VITAMIN D 25 HYDROXY (VIT D DEFICIENCY, FRACTURES): Vit D, 25-Hydroxy: 22.5 ng/mL — ABNORMAL LOW (ref 30.0–100.0)

## 2019-06-13 LAB — TESTOSTERONE: Testosterone: 240 ng/dL — ABNORMAL LOW (ref 264–916)

## 2019-06-13 NOTE — Assessment & Plan Note (Addendum)
1.  Secondary polycythemia: - Patient reports he clotted when he would go to donate blood.  He was always hot and very thirsty.  PCP noticed his hematocrit and hemoglobin were elevated and sent him to hematology. - His blood work was evaluated he was negative for HIV, Jak 2 and BCR/ABL.  EPO level was normal at 10.  SPEP, ANA, CRP, RF were all normal. - Hemochromatosis gene evaluation showed he had a single C282Y gene and is a carrier for hemochromatosis. - He also reports he has sleep apnea and uses a CPAP machine at night.  He was referred for another sleep study evaluation.  But they canceled his appointment due to the COVID crisis. -Patient denies smoking history.  Denies any headaches or blurred vision.  Denies any skin changes. - Patient had his first 2 500 mL phlebotomies on 02/09/2019 in 02/23/2019.  He reports he is not as hot or thirsty since the phlebotomies. - Labs on 06/12/2019 showed his hemoglobin 17.3, hematocrit 48.9, ferritin 33, percent saturation 34 - We will set him up for 1 phlebotomy. To maintain his hematocrit around 45. - He will follow-up with [place] in 2 months with labs.  2.  Sleep apnea: - We have discussed with the patient that obstructive sleep apnea may cause secondary polycythemia. - Patient has been referred for a sleep study evaluation. - The appointment was canceled due to the [place] crisis we will reschedule after. -Patient reports he uses a CPAP machine at night  3.  Positive hep B serology: - Patient has positive hep B surface antibody and positive hep B core antibody. Surface antigen and IgM are negative.  Hep C and HIV are negative.  Patient is immune based on prior exposure.  4.  Elevated PSA: -Patient has a PSA of 7.5. -He is followed by advanced urology. -He had a prostate biopsy on 03/30/2017 with 12 core submitted that were benign. -Patient will continue to follow-up with urology as directed.  5.  Health maintenance: -Patient had a colonoscopy on  02/07/2019 that showed a 5 mm polyp in the ascending colon that showed tubular adenoma.  Diverticulosis in the rectosigmoid colon and the ascending colon.  Torturous colon.  External and internal hemorrhoids. -Patient will follow-up with GI as recommended.

## 2019-06-13 NOTE — Progress Notes (Signed)
[address], [place] [id number]   CLINIC:  Medical Oncology/Hematology  PCP:  Dettinger, Fransisca Kaufmann, MD [place] [id number] [phone]   REASON FOR VISIT: Follow-up for secondary polycythemia  CURRENT THERAPY: Intermittent phlebotomies   INTERVAL HISTORY:  [name] 68 y.o. male returns for routine follow-up for secondary polycythemia.  He reports he has felt great since his last 2 phlebotomies.  He has noticed he has become hot lately and slightly more fatigue. Denies any nausea, vomiting, or diarrhea. Denies any new pains. Had not noticed any recent bleeding such as epistaxis, hematuria or hematochezia. Denies recent chest pain on exertion, shortness of breath on minimal exertion, pre-syncopal episodes, or palpitations. Denies any numbness or tingling in hands or feet. Denies any recent fevers, infections, or recent hospitalizations. Patient reports appetite at 75% and energy level at 75%.  He is eating well and maintaining his weight at this time.     REVIEW OF SYSTEMS:  Review of Systems  Constitutional: Positive for fatigue.  Respiratory: Positive for cough.   Psychiatric/Behavioral: Positive for sleep disturbance.  All other systems reviewed and are negative.    PAST MEDICAL/SURGICAL HISTORY:  Past Medical History:  Diagnosis Date  . Diabetes mellitus without complication ([facility])   . GERD (gastroesophageal reflux disease)   . Hx of Rocky Mountain spotted fever 1-1-12011  . Hypertension   . Sleep apnea    wears cpap    Past Surgical History:  Procedure Laterality Date  . COLONOSCOPY  09/2012   Dr. Britta Mccreedy. Diverticulosis, descending adenomatous colon polyp, hyperplastic rectal polyp. ?mass in anal canal evaluated by surgery and consistent with internal hemorrhoid  . COLONOSCOPY WITH PROPOFOL N/A 02/07/2019   Procedure: COLONOSCOPY WITH PROPOFOL;  Surgeon: Danie Binder, MD;  [facility];  Service:  Endoscopy;  Laterality: N/A;  9:30am  . KNEE SURGERY Left 10/2014  . KNEE SURGERY Right 09/2015  . POLYPECTOMY  02/07/2019   Procedure: POLYPECTOMY;  Surgeon: Danie Binder, MD;  Location: [facility];  Service: Endoscopy;;     SOCIAL HISTORY:  Social History   Socioeconomic History  . Marital status: Single    Spouse name: Not on file  . Number of children: Not on file  . Years of education: Not on file  . Highest education level: 12th grade  Occupational History  . Occupation: Retired    Comment: Pensions consultant and gamble  Social Needs  . Financial resource strain: Not hard at all  . Food insecurity    Worry: Never true    Inability: Never true  . Transportation needs    Medical: No    Non-medical: No  Tobacco Use  . Smoking status: Never Smoker  . Smokeless tobacco: Never Used  Substance and Sexual Activity  . Alcohol use: No  . Drug use: No  . Sexual activity: Not Currently  Lifestyle  . Physical activity    Days per week: 7 days    Minutes per session: 50 min  . Stress: Not at all  Relationships  . Social connections    Talks on phone: More than three times a week    Gets together: More than three times a week    Attends religious service: More than 4 times per year    Active member of club or organization: Yes    Attends meetings of clubs or organizations: More than 4 times per year    Relationship status: Never  married  . Intimate partner violence    Fear of current or ex partner: No    Emotionally abused: No    Physically abused: No    Forced sexual activity: No  Other Topics Concern  . Not on file  Social History Narrative   WORKED AT [employer] > 10 YRS. NOT MARRIED. FUR BABIES: 3 DOGS AND 5 CATS. SPENDS FREE TIME: [place], [place], [place].    FAMILY HISTORY:  Family History  Problem Relation Age of Onset  . Heart disease Mother   . Congestive Heart Failure Mother   . Diabetes Father   . Aneurysm Sister   . Diabetes Paternal Uncle    . Diabetes Paternal Grandmother   . Colon cancer Neg Hx   . Colon polyps Neg Hx     CURRENT MEDICATIONS:  Outpatient Encounter Medications as of 06/13/2019  Medication Sig  . glucose blood test strip Use to check blood glucose once daily at varying times.  Dispense Verio One Touch Test strips  . hydrOXYzine (ATARAX/VISTARIL) 50 MG tablet Take 1 tablet (50 mg total) by mouth at bedtime as needed. (Patient taking differently: Take 50 mg by mouth at bedtime as needed (allergies/sleep). )  . irbesartan (AVAPRO) 150 MG tablet TAKE 1 TABLET BY MOUTH EVERY DAY (Patient taking differently: Take 150 mg by mouth at bedtime. )  . metFORMIN (GLUCOPHAGE) 1000 MG tablet Take 1 tablet (1,000 mg total) by mouth 2 (two) times daily with a meal.  . Multiple Vitamins-Minerals (CENTRUM SILVER 50+MEN PO) Take 1 tablet by mouth daily. Every now and then  . ONETOUCH DELICA LANCETS 92E MISC Use to check blood glucose once daily at varying times  . oxymetazoline (AFRIN) 0.05 % nasal spray Place 1 spray into both nostrils at bedtime as needed for congestion.  . Probiotic Product (PROBIOTIC PO) Take 1 capsule by mouth 3 (three) times a week.   Marland Kitchen aspirin EC 81 MG tablet Take 81 mg by mouth every evening.  . Omega-3 Fatty Acids (FISH OIL) 500 MG CAPS Take 500 mg by mouth daily.   No facility-administered encounter medications on file as of 06/13/2019.     ALLERGIES:  Allergies  Allergen Reactions  . Ramipril Cough     PHYSICAL EXAM:  ECOG Performance status: 1  Vitals:   06/13/19 0943  BP: (!) 145/98  Pulse: 72  Resp: 18  Temp: (!) 96.8 F (36 C)  SpO2: 95%   Filed Weights   06/13/19 0943  Weight: 260 lb 12.8 oz (118.3 kg)    Physical Exam Constitutional:      Appearance: Normal appearance. He is normal weight.  Cardiovascular:     Rate and Rhythm: Normal rate and regular rhythm.     Heart sounds: Normal heart sounds.  Pulmonary:     Effort: Pulmonary effort is normal.     Breath sounds:  Normal breath sounds.  Abdominal:     General: Bowel sounds are normal.     Palpations: Abdomen is soft.  Musculoskeletal: Normal range of motion.  Skin:    General: Skin is warm and dry.  Neurological:     Mental Status: He is alert and oriented to person, place, and time. Mental status is at baseline.  Psychiatric:        Mood and Affect: Mood normal.        Behavior: Behavior normal.        Thought Content: Thought content normal.  Judgment: Judgment normal.      LABORATORY DATA:  I have reviewed the labs as listed.  CBC    Component Value Date/Time   WBC 5.8 06/12/2019 0949   RBC 5.34 06/12/2019 0949   HGB 17.3 (H) 06/12/2019 0949   HGB 18.5 (H) 12/23/2018 0804   HCT 48.9 06/12/2019 0949   HCT 53.3 (H) 12/23/2018 0804   PLT 213 06/12/2019 0949   PLT 246 12/23/2018 0804   MCV 91.6 06/12/2019 0949   MCV 95 12/23/2018 0804   MCH 32.4 06/12/2019 0949   MCHC 35.4 06/12/2019 0949   RDW 11.9 06/12/2019 0949   RDW 12.6 12/23/2018 0804   LYMPHSABS 1.4 06/12/2019 0949   LYMPHSABS 1.4 12/23/2018 0804   MONOABS 0.5 06/12/2019 0949   EOSABS 0.2 06/12/2019 0949   EOSABS 0.1 12/23/2018 0804   BASOSABS 0.1 06/12/2019 0949   BASOSABS 0.1 12/23/2018 0804   CMP Latest Ref Rng & Units 06/12/2019 04/05/2019 01/24/2019  Glucose 70 - 99 mg/dL 693(HH) 340(H) 210(H)  BUN 8 - 23 mg/dL 32(H) 20 21  Creatinine 0.61 - 1.24 mg/dL 1.29(H) 1.23 1.16  Sodium 135 - 145 mmol/L 128(L) 135 136  Potassium 3.5 - 5.1 mmol/L 4.9 4.7 4.2  Chloride 98 - 111 mmol/L 92(L) 98 105  CO2 22 - 32 mmol/L 22 25 22   Calcium 8.9 - 10.3 mg/dL 9.7 9.5 9.2  Total Protein 6.5 - 8.1 g/dL 7.6 7.7 7.6  Total Bilirubin 0.3 - 1.2 mg/dL 1.0 1.3(H) 1.0  Alkaline Phos 38 - 126 U/L 146(H) 91 78  AST 15 - 41 U/L 26 26 24   ALT 0 - 44 U/L 31 27 32   I personally performed a face-to-face visit.  All questions were answered to patient's stated satisfaction. Encouraged patient to call with any new concerns or questions  before his next visit to the cancer center and we can certain see him sooner, if needed.     ASSESSMENT & PLAN:   Polycythemia, secondary 1.  Secondary polycythemia: - Patient reports he clotted when he would go to donate blood.  He was always hot and very thirsty.  PCP noticed his hematocrit and hemoglobin were elevated and sent him to hematology. - His blood work was evaluated he was negative for HIV, Jak 2 and BCR/ABL.  EPO level was normal at 10.  SPEP, ANA, CRP, RF were all normal. - Hemochromatosis gene evaluation showed he had a single C282Y gene and is a carrier for hemochromatosis. - He also reports he has sleep apnea and uses a CPAP machine at night.  He was referred for another sleep study evaluation.  But they canceled his appointment due to the COVID crisis. -Patient denies smoking history.  Denies any headaches or blurred vision.  Denies any skin changes. - Patient had his first 2 500 mL phlebotomies on 02/09/2019 in 02/23/2019.  He reports he is not as hot or thirsty since the phlebotomies. - Labs on 06/12/2019 showed his hemoglobin 17.3, hematocrit 48.9, ferritin 33, percent saturation 34 - We will set him up for 1 phlebotomy. To maintain his hematocrit around 45. - He will follow-up with [place] in 2 months with labs.  2.  Sleep apnea: - We have discussed with the patient that obstructive sleep apnea may cause secondary polycythemia. - Patient has been referred for a sleep study evaluation. - The appointment was canceled due to the [place] crisis we will reschedule after. -Patient reports he uses a CPAP machine at night  3.  Positive hep B serology: - Patient has positive hep B surface antibody and positive hep B core antibody. Surface antigen and IgM are negative.  Hep C and HIV are negative.  Patient is immune based on prior exposure.  4.  Elevated PSA: -Patient has a PSA of 7.5. -He is followed by advanced urology. -He had a prostate biopsy on 03/30/2017 with 12 core submitted  that were benign. -Patient will continue to follow-up with urology as directed.  5.  Health maintenance: -Patient had a colonoscopy on 02/07/2019 that showed a 5 mm polyp in the ascending colon that showed tubular adenoma.  Diverticulosis in the rectosigmoid colon and the ascending colon.  Torturous colon.  External and internal hemorrhoids. -Patient will follow-up with GI as recommended.      Orders placed this encounter:  Orders Placed This Encounter  Procedures  . Lactate dehydrogenase  . CBC with Differential/Platelet  . Comprehensive metabolic panel  . Ferritin  . Iron and TIBC  . Vitamin B12  . VITAMIN D 25 Hydroxy (Vit-D Deficiency, Fractures)  . Folate      Francene Finders, FNP-C [facility] [phone]

## 2019-06-13 NOTE — Patient Instructions (Signed)
[facility] Discharge Instructions  Follow up in 2 months with labs   Thank you for choosing  Cancer Center at [facility] to provide your oncology and hematology care.  To afford each patient quality time with our provider, please arrive at least 15 minutes before your scheduled appointment time.   If you have a lab appointment with the Cancer Center please come in thru the  Main Entrance and check in at the main information desk  You need to re-schedule your appointment should you arrive 10 or more minutes late.  We strive to give you quality time with our providers, and arriving late affects you and other patients whose appointments are after yours.  Also, if you no show three or more times for appointments you may be dismissed from the clinic at the providers discretion.     Again, thank you for choosing [facility].  Our hope is that these requests will decrease the amount of time that you wait before being seen by our physicians.       _____________________________________________________________  Should you have questions after your visit to [facility], please contact our office at [phone] between the hours of 8:00 a.m. and 4:30 p.m.  Voicemails left after 4:00 p.m. will not be returned until the following business day.  For prescription refill requests, have your pharmacy contact our office and allow 72 hours.    Cancer Center Support Programs:   > Cancer Support Group  2nd Tuesday of the month 1pm-2pm, Journey Room    

## 2019-06-14 ENCOUNTER — Encounter (HOSPITAL_COMMUNITY): Payer: Medicare Other

## 2019-06-19 ENCOUNTER — Inpatient Hospital Stay (HOSPITAL_COMMUNITY): Payer: Medicare Other | Attending: Internal Medicine

## 2019-06-19 ENCOUNTER — Other Ambulatory Visit: Payer: Self-pay

## 2019-06-19 DIAGNOSIS — D751 Secondary polycythemia: Secondary | ICD-10-CM | POA: Insufficient documentation

## 2019-06-19 NOTE — Progress Notes (Signed)
  [name] presents today for phlebotomy per MD orders.  Labs checked with last note from the provider.  No s/s of distress noted.   Phlebotomy procedure started at 1300 and ended at 1307. 500 cc removed. Patient tolerated procedure well. IV needle removed intact with dressing applied.  Site clean and dry with no bruising or swelling noted at site.  Patient vital signs stable with no s/s of distress noted.  Patient discharged ambulatory.

## 2019-07-06 ENCOUNTER — Other Ambulatory Visit: Payer: Self-pay | Admitting: Family Medicine

## 2019-08-07 ENCOUNTER — Telehealth: Payer: Self-pay | Admitting: Family Medicine

## 2019-08-22 ENCOUNTER — Ambulatory Visit (INDEPENDENT_AMBULATORY_CARE_PROVIDER_SITE_OTHER): Payer: Medicare Other | Admitting: Urology

## 2019-08-22 DIAGNOSIS — R972 Elevated prostate specific antigen [PSA]: Secondary | ICD-10-CM

## 2019-08-22 DIAGNOSIS — R3914 Feeling of incomplete bladder emptying: Secondary | ICD-10-CM | POA: Diagnosis not present

## 2019-08-22 DIAGNOSIS — R3912 Poor urinary stream: Secondary | ICD-10-CM | POA: Diagnosis not present

## 2019-08-22 DIAGNOSIS — N401 Enlarged prostate with lower urinary tract symptoms: Secondary | ICD-10-CM

## 2019-08-23 ENCOUNTER — Other Ambulatory Visit: Payer: Self-pay

## 2019-08-23 ENCOUNTER — Inpatient Hospital Stay (HOSPITAL_COMMUNITY): Payer: Medicare Other | Attending: Hematology

## 2019-08-23 ENCOUNTER — Other Ambulatory Visit (HOSPITAL_COMMUNITY)
Admission: RE | Admit: 2019-08-23 | Discharge: 2019-08-23 | Disposition: A | Discharge status: Home / Self Care | Payer: Medicare Other | Source: Ambulatory Visit | Attending: Urology | Admitting: Urology

## 2019-08-23 DIAGNOSIS — D751 Secondary polycythemia: Secondary | ICD-10-CM | POA: Diagnosis not present

## 2019-08-23 DIAGNOSIS — I10 Essential (primary) hypertension: Secondary | ICD-10-CM | POA: Insufficient documentation

## 2019-08-23 DIAGNOSIS — E119 Type 2 diabetes mellitus without complications: Secondary | ICD-10-CM | POA: Insufficient documentation

## 2019-08-23 DIAGNOSIS — R5383 Other fatigue: Secondary | ICD-10-CM | POA: Insufficient documentation

## 2019-08-23 DIAGNOSIS — Z79899 Other long term (current) drug therapy: Secondary | ICD-10-CM | POA: Diagnosis not present

## 2019-08-23 DIAGNOSIS — R972 Elevated prostate specific antigen [PSA]: Secondary | ICD-10-CM | POA: Insufficient documentation

## 2019-08-23 DIAGNOSIS — Z7982 Long term (current) use of aspirin: Secondary | ICD-10-CM | POA: Diagnosis not present

## 2019-08-23 DIAGNOSIS — G4730 Sleep apnea, unspecified: Secondary | ICD-10-CM | POA: Insufficient documentation

## 2019-08-23 DIAGNOSIS — K219 Gastro-esophageal reflux disease without esophagitis: Secondary | ICD-10-CM | POA: Insufficient documentation

## 2019-08-23 DIAGNOSIS — Z7984 Long term (current) use of oral hypoglycemic drugs: Secondary | ICD-10-CM | POA: Diagnosis not present

## 2019-08-23 LAB — VITAMIN B12: Vitamin B-12: 307 pg/mL (ref 180–914)

## 2019-08-23 LAB — CBC WITH DIFFERENTIAL/PLATELET
Abs Immature Granulocytes: 0.02 10*3/uL (ref 0.00–0.07)
Basophils Absolute: 0.1 10*3/uL (ref 0.0–0.1)
Basophils Relative: 1 %
Eosinophils Absolute: 0.2 10*3/uL (ref 0.0–0.5)
Eosinophils Relative: 2 %
HCT: 51 % (ref 39.0–52.0)
Hemoglobin: 17.4 g/dL — ABNORMAL HIGH (ref 13.0–17.0)
Immature Granulocytes: 0 %
Lymphocytes Relative: 26 %
Lymphs Abs: 1.7 10*3/uL (ref 0.7–4.0)
MCH: 31 pg (ref 26.0–34.0)
MCHC: 34.1 g/dL (ref 30.0–36.0)
MCV: 90.9 fL (ref 80.0–100.0)
Monocytes Absolute: 0.5 10*3/uL (ref 0.1–1.0)
Monocytes Relative: 7 %
Neutro Abs: 4.1 10*3/uL (ref 1.7–7.7)
Neutrophils Relative %: 64 %
Platelets: 274 10*3/uL (ref 150–400)
RBC: 5.61 MIL/uL (ref 4.22–5.81)
RDW: 12.9 % (ref 11.5–15.5)
WBC: 6.5 10*3/uL (ref 4.0–10.5)
nRBC: 0 % (ref 0.0–0.2)

## 2019-08-23 LAB — IRON AND TIBC
Iron: 85 ug/dL (ref 45–182)
Saturation Ratios: 19 % (ref 17.9–39.5)
TIBC: 456 ug/dL — ABNORMAL HIGH (ref 250–450)
UIBC: 371 ug/dL

## 2019-08-23 LAB — LACTATE DEHYDROGENASE: LDH: 143 U/L (ref 98–192)

## 2019-08-23 LAB — COMPREHENSIVE METABOLIC PANEL
ALT: 29 U/L (ref 0–44)
AST: 26 U/L (ref 15–41)
Albumin: 4.1 g/dL (ref 3.5–5.0)
Alkaline Phosphatase: 83 U/L (ref 38–126)
Anion gap: 8 (ref 5–15)
BUN: 23 mg/dL (ref 8–23)
CO2: 22 mmol/L (ref 22–32)
Calcium: 9.2 mg/dL (ref 8.9–10.3)
Chloride: 103 mmol/L (ref 98–111)
Creatinine, Ser: 1 mg/dL (ref 0.61–1.24)
GFR calc Af Amer: >60 mL/min (ref >60)
GFR calc non Af Amer: >60 mL/min (ref >60)
Glucose, Bld: 141 mg/dL — ABNORMAL HIGH (ref 70–99)
Potassium: 4.2 mmol/L (ref 3.5–5.1)
Sodium: 133 mmol/L — ABNORMAL LOW (ref 135–145)
Total Bilirubin: 1 mg/dL (ref 0.3–1.2)
Total Protein: 7.9 g/dL (ref 6.5–8.1)

## 2019-08-23 LAB — FOLATE: Folate: 14.3 ng/mL (ref >5.9)

## 2019-08-23 LAB — FERRITIN: Ferritin: 23 ng/mL — ABNORMAL LOW (ref 24–336)

## 2019-08-23 LAB — PSA: Prostatic Specific Antigen: 9.27 ng/mL — ABNORMAL HIGH (ref 0.00–4.00)

## 2019-08-24 ENCOUNTER — Other Ambulatory Visit: Payer: Self-pay

## 2019-08-24 ENCOUNTER — Inpatient Hospital Stay (HOSPITAL_BASED_OUTPATIENT_CLINIC_OR_DEPARTMENT_OTHER): Payer: Medicare Other | Admitting: Nurse Practitioner

## 2019-08-24 ENCOUNTER — Encounter (HOSPITAL_COMMUNITY): Payer: Self-pay | Admitting: Nurse Practitioner

## 2019-08-24 DIAGNOSIS — D751 Secondary polycythemia: Secondary | ICD-10-CM

## 2019-08-24 DIAGNOSIS — R5383 Other fatigue: Secondary | ICD-10-CM | POA: Diagnosis not present

## 2019-08-24 DIAGNOSIS — E119 Type 2 diabetes mellitus without complications: Secondary | ICD-10-CM | POA: Diagnosis not present

## 2019-08-24 DIAGNOSIS — R972 Elevated prostate specific antigen [PSA]: Secondary | ICD-10-CM | POA: Diagnosis not present

## 2019-08-24 DIAGNOSIS — G4730 Sleep apnea, unspecified: Secondary | ICD-10-CM | POA: Diagnosis not present

## 2019-08-24 LAB — VITAMIN D 25 HYDROXY (VIT D DEFICIENCY, FRACTURES): Vit D, 25-Hydroxy: 38.8 ng/mL (ref 30.0–100.0)

## 2019-08-24 NOTE — Progress Notes (Signed)
[name], [place] [id number]   CLINIC:  Medical Oncology/Hematology  PCP:  Dettinger, Fransisca Kaufmann, MD [place] [phone]   REASON FOR VISIT: Follow-up for polycythemia  CURRENT THERAPY: Intermittent phlebotomies   INTERVAL HISTORY:  [name] 68 y.o. male returns for routine follow-up for polycythemia.  He reports he is feeling more fatigued and he is staying hot all the time.  He denies any bright red bleeding per rectum or melena. Denies any nausea, vomiting, or diarrhea. Denies any new pains. Had not noticed any recent bleeding such as epistaxis, hematuria or hematochezia. Denies recent chest pain on exertion, shortness of breath on minimal exertion, pre-syncopal episodes, or palpitations. Denies any numbness or tingling in hands or feet. Denies any recent fevers, infections, or recent hospitalizations. Patient reports appetite at 75% and energy level at 75%.  He is eating well maintain his weight this time.    REVIEW OF SYSTEMS:  Review of Systems  Constitutional: Positive for fatigue.  Endocrine: Positive for hot flashes.  Psychiatric/Behavioral: Positive for sleep disturbance.  All other systems reviewed and are negative.    PAST MEDICAL/SURGICAL HISTORY:  Past Medical History:  Diagnosis Date  . Diabetes mellitus without complication ([facility])   . GERD (gastroesophageal reflux disease)   . Hx of Rocky Mountain spotted fever 1-1-12011  . Hypertension   . Sleep apnea    wears cpap    Past Surgical History:  Procedure Laterality Date  . COLONOSCOPY  09/2012   Dr. Britta Mccreedy. Diverticulosis, descending adenomatous colon polyp, hyperplastic rectal polyp. ?mass in anal canal evaluated by surgery and consistent with internal hemorrhoid  . COLONOSCOPY WITH PROPOFOL N/A 02/07/2019   Procedure: COLONOSCOPY WITH PROPOFOL;  Surgeon: Danie Binder, MD;  [facility];  Service: Endoscopy;  Laterality: N/A;   9:30am  . KNEE SURGERY Left 10/2014  . KNEE SURGERY Right 09/2015  . POLYPECTOMY  02/07/2019   Procedure: POLYPECTOMY;  Surgeon: Danie Binder, MD;  [facility];  Service: Endoscopy;;     SOCIAL HISTORY:  Social History   Socioeconomic History  . Marital status: Single    Spouse name: Not on file  . Number of children: Not on file  . Years of education: Not on file  . Highest education level: 12th grade  Occupational History  . Occupation: Retired    Comment: Pensions consultant and gamble  Social Needs  . Financial resource strain: Not hard at all  . Food insecurity    Worry: Never true    Inability: Never true  . Transportation needs    Medical: No    Non-medical: No  Tobacco Use  . Smoking status: Never Smoker  . Smokeless tobacco: Never Used  Substance and Sexual Activity  . Alcohol use: No  . Drug use: No  . Sexual activity: Not Currently  Lifestyle  . Physical activity    Days per week: 7 days    Minutes per session: 50 min  . Stress: Not at all  Relationships  . Social connections    Talks on phone: More than three times a week    Gets together: More than three times a week    Attends religious service: More than 4 times per year    Active member of club or organization: Yes    Attends meetings of clubs or organizations: More than 4 times per year    Relationship status: Never married  .  Intimate partner violence    Fear of current or ex partner: No    Emotionally abused: No    Physically abused: No    Forced sexual activity: No  Other Topics Concern  . Not on file  Social History Narrative   WORKED AT [employer] > 10 YRS. NOT MARRIED. FUR BABIES: 3 DOGS AND 5 CATS. SPENDS FREE TIME: [place], [place], [place].    FAMILY HISTORY:  Family History  Problem Relation Age of Onset  . Heart disease Mother   . Congestive Heart Failure Mother   . Diabetes Father   . Aneurysm Sister   . Diabetes Paternal Uncle   . Diabetes Paternal  Grandmother   . Colon cancer Neg Hx   . Colon polyps Neg Hx     CURRENT MEDICATIONS:  Outpatient Encounter Medications as of 08/24/2019  Medication Sig  . aspirin EC 81 MG tablet Take 81 mg by mouth every evening.  Marland Kitchen glucose blood test strip Use to check blood glucose once daily at varying times.  Dispense Verio One Touch Test strips  . irbesartan (AVAPRO) 150 MG tablet TAKE 1 TABLET BY MOUTH EVERY DAY  . metFORMIN (GLUCOPHAGE) 1000 MG tablet Take 1 tablet (1,000 mg total) by mouth 2 (two) times daily with a meal.  . Multiple Vitamins-Minerals (CENTRUM SILVER 50+MEN PO) Take 1 tablet by mouth daily. Every now and then  . Omega-3 Fatty Acids (FISH OIL) 500 MG CAPS Take 500 mg by mouth daily.  [name] DELICA LANCETS 99991111 MISC Use to check blood glucose once daily at varying times  . Probiotic Product (PROBIOTIC PO) Take 1 capsule by mouth 3 (three) times a week.   Marland Kitchen oxymetazoline (AFRIN) 0.05 % nasal spray Place 1 spray into both nostrils at bedtime as needed for congestion.  . [DISCONTINUED] hydrOXYzine (ATARAX/VISTARIL) 50 MG tablet Take 1 tablet (50 mg total) by mouth at bedtime as needed. (Patient taking differently: Take 50 mg by mouth at bedtime as needed (allergies/sleep). )   No facility-administered encounter medications on file as of 08/24/2019.     ALLERGIES:  Allergies  Allergen Reactions  . Ramipril Cough     PHYSICAL EXAM:  ECOG Performance status: 1  Vitals:   08/24/19 1413  BP: 135/71  Pulse: 71  Resp: 18  Temp: 98.4 F (36.9 C)  SpO2: 93%   Filed Weights   08/24/19 1413  Weight: 262 lb (118.8 kg)    Physical Exam Constitutional:      Appearance: He is obese.  Cardiovascular:     Rate and Rhythm: Normal rate and regular rhythm.     Heart sounds: Normal heart sounds.  Pulmonary:     Effort: Pulmonary effort is normal.     Breath sounds: Normal breath sounds.  Abdominal:     General: Bowel sounds are normal.     Palpations: Abdomen is soft.   Musculoskeletal: Normal range of motion.  Skin:    General: Skin is warm and dry.  Neurological:     Mental Status: He is alert and oriented to person, place, and time. Mental status is at baseline.  Psychiatric:        Mood and Affect: Mood normal.        Behavior: Behavior normal.        Thought Content: Thought content normal.        Judgment: Judgment normal.      LABORATORY DATA:  I have reviewed the labs as listed.  CBC    Component Value Date/Time   WBC 6.5 08/23/2019 1047   RBC 5.61 08/23/2019 1047   HGB 17.4 (H) 08/23/2019 1047   HGB 18.5 (H) 12/23/2018 0804   HCT 51.0 08/23/2019 1047   HCT 53.3 (H) 12/23/2018 0804   PLT 274 08/23/2019 1047   PLT 246 12/23/2018 0804   MCV 90.9 08/23/2019 1047   MCV 95 12/23/2018 0804   MCH 31.0 08/23/2019 1047   MCHC 34.1 08/23/2019 1047   RDW 12.9 08/23/2019 1047   RDW 12.6 12/23/2018 0804   LYMPHSABS 1.7 08/23/2019 1047   LYMPHSABS 1.4 12/23/2018 0804   MONOABS 0.5 08/23/2019 1047   EOSABS 0.2 08/23/2019 1047   EOSABS 0.1 12/23/2018 0804   BASOSABS 0.1 08/23/2019 1047   BASOSABS 0.1 12/23/2018 0804   CMP Latest Ref Rng & Units 08/23/2019 06/12/2019 04/05/2019  Glucose 70 - 99 mg/dL 141(H) 693(HH) 340(H)  BUN 8 - 23 mg/dL 23 32(H) 20  Creatinine 0.61 - 1.24 mg/dL 1.00 1.29(H) 1.23  Sodium 135 - 145 mmol/L 133(L) 128(L) 135  Potassium 3.5 - 5.1 mmol/L 4.2 4.9 4.7  Chloride 98 - 111 mmol/L 103 92(L) 98  CO2 22 - 32 mmol/L 22 22 25   Calcium 8.9 - 10.3 mg/dL 9.2 9.7 9.5  Total Protein 6.5 - 8.1 g/dL 7.9 7.6 7.7  Total Bilirubin 0.3 - 1.2 mg/dL 1.0 1.0 1.3(H)  Alkaline Phos 38 - 126 U/L 83 146(H) 91  AST 15 - 41 U/L 26 26 26   ALT 0 - 44 U/L 29 31 27      I personally performed a face-to-face visit.  All questions were answered to patient's stated satisfaction. Encouraged patient to call with any new concerns or questions before his next visit to the cancer center and we can certain see him sooner, if needed.      ASSESSMENT & PLAN:   Polycythemia, secondary 1.  Secondary polycythemia: - Patient reports he clotted when he would go to donate blood.  He was always hot and very thirsty.  PCP noticed his hematocrit and hemoglobin were elevated and sent him to hematology. - His blood work was evaluated he was negative for HIV, Jak 2 and BCR/ABL.  EPO level was normal at 10.  SPEP, ANA, CRP, RF were all normal. - Hemochromatosis gene evaluation showed he had a single C282Y gene and is a carrier for hemochromatosis. - He also reports he has sleep apnea and uses a CPAP machine at night.  He was referred for another sleep study evaluation.  But they canceled his appointment due to the COVID crisis. -Patient denies smoking history.  Denies any headaches or blurred vision.  Denies any skin changes. - Patient had his first 2 500 mL phlebotomies on 02/09/2019 in 02/23/2019.  He reports he is not as hot or thirsty since the phlebotomies. - Labs on 08/23/2019 showed his hemoglobin 17.4, hematocrit 51 point, platelets 274, ferritin 23, percent saturation 19 - We will set him up for 2 phlebotomies one month apart. To maintain his hematocrit around 45. - He will follow-up with [place] in 3 months with labs.  2.  Sleep apnea: - We have discussed with the patient that obstructive sleep apnea may cause secondary polycythemia. - Patient has been referred for a sleep study evaluation. - The appointment was canceled due to the [place] crisis we will reschedule after. -Patient reports he uses a CPAP machine at night  3.  Positive hep B serology: - Patient has positive hep B  surface antibody and positive hep B core antibody. Surface antigen and IgM are negative.  Hep C and HIV are negative.  Patient is immune based on prior exposure.  4.  Elevated PSA: -Patient has a PSA of 7.5. -He is followed by advanced urology. -He had a prostate biopsy on 03/30/2017 with 12 core submitted that were benign. -Patient will continue to follow-up with  urology as directed.  5.  Health maintenance: -Patient had a colonoscopy on 02/07/2019 that showed a 5 mm polyp in the ascending colon that showed tubular adenoma.  Diverticulosis in the rectosigmoid colon and the ascending colon.  Torturous colon.  External and internal hemorrhoids. -Patient will follow-up with GI as recommended.      Orders placed this encounter:  Orders Placed This Encounter  Procedures  . TSH  . Lactate dehydrogenase  . CBC with Differential/Platelet  . Comprehensive metabolic panel  . Ferritin  . Iron and TIBC  . Vitamin B12  . VITAMIN D 25 Hydroxy (Vit-D Deficiency, Fractures)      [place] [phone]

## 2019-08-24 NOTE — Assessment & Plan Note (Addendum)
1.  Secondary polycythemia: - Patient reports he clotted when he would go to donate blood.  He was always hot and very thirsty.  PCP noticed his hematocrit and hemoglobin were elevated and sent him to hematology. - His blood work was evaluated he was negative for HIV, Jak 2 and BCR/ABL.  EPO level was normal at 10.  SPEP, ANA, CRP, RF were all normal. - Hemochromatosis gene evaluation showed he had a single C282Y gene and is a carrier for hemochromatosis. - He also reports he has sleep apnea and uses a CPAP machine at night.  He was referred for another sleep study evaluation.  But they canceled his appointment due to the COVID crisis. -Patient denies smoking history.  Denies any headaches or blurred vision.  Denies any skin changes. - Patient had his first 2 500 mL phlebotomies on 02/09/2019 in 02/23/2019.  He reports he is not as hot or thirsty since the phlebotomies. - Labs on 08/23/2019 showed his hemoglobin 17.4, hematocrit 51 point, platelets 274, ferritin 23, percent saturation 19 - We will set him up for 2 phlebotomies one month apart. To maintain his hematocrit around 45. - He will follow-up with [place] in 3 months with labs.  2.  Sleep apnea: - We have discussed with the patient that obstructive sleep apnea may cause secondary polycythemia. - Patient has been referred for a sleep study evaluation. - The appointment was canceled due to the [place] crisis we will reschedule after. -Patient reports he uses a CPAP machine at night  3.  Positive hep B serology: - Patient has positive hep B surface antibody and positive hep B core antibody. Surface antigen and IgM are negative.  Hep C and HIV are negative.  Patient is immune based on prior exposure.  4.  Elevated PSA: -Patient has a PSA of 7.5. -He is followed by advanced urology. -He had a prostate biopsy on 03/30/2017 with 12 core submitted that were benign. -Patient will continue to follow-up with urology as directed.  5.  Health  maintenance: -Patient had a colonoscopy on 02/07/2019 that showed a 5 mm polyp in the ascending colon that showed tubular adenoma.  Diverticulosis in the rectosigmoid colon and the ascending colon.  Torturous colon.  External and internal hemorrhoids. -Patient will follow-up with GI as recommended.

## 2019-08-24 NOTE — Patient Instructions (Signed)
[place] at [facility] Discharge Instructions  We will set up 2 phlebotomies 1 month apart. Follow up in 3 months with labs    Thank you for choosing [place] at [facility] to provide your oncology and hematology care.  To afford each patient quality time with our provider, please arrive at least 15 minutes before your scheduled appointment time.   If you have a lab appointment with the [place] please come in thru the Main Entrance and check in at the main information desk.  You need to re-schedule your appointment should you arrive 10 or more minutes late.  We strive to give you quality time with our providers, and arriving late affects you and other patients whose appointments are after yours.  Also, if you no show three or more times for appointments you may be dismissed from the clinic at the providers discretion.     Again, thank you for choosing [facility].  Our hope is that these requests will decrease the amount of time that you wait before being seen by our physicians.       _____________________________________________________________  Should you have questions after your visit to [facility], please contact our office at [phone] between the hours of 8:00 a.m. and 4:30 p.m.  Voicemails left after 4:00 p.m. will not be returned until the following business day.  For prescription refill requests, have your pharmacy contact our office and allow 72 hours.    Due to Covid, you will need to wear a mask upon entering the hospital. If you do not have a mask, a mask will be given to you at the Main Entrance upon arrival. For doctor visits, patients may have 1 support person with them. For treatment visits, patients can not have anyone with them due to social distancing guidelines and our immunocompromised population.

## 2019-08-28 ENCOUNTER — Inpatient Hospital Stay (HOSPITAL_COMMUNITY): Payer: Medicare Other

## 2019-08-28 ENCOUNTER — Other Ambulatory Visit: Payer: Self-pay

## 2019-08-28 DIAGNOSIS — G4730 Sleep apnea, unspecified: Secondary | ICD-10-CM | POA: Diagnosis not present

## 2019-08-28 DIAGNOSIS — D751 Secondary polycythemia: Secondary | ICD-10-CM | POA: Diagnosis not present

## 2019-08-28 DIAGNOSIS — E119 Type 2 diabetes mellitus without complications: Secondary | ICD-10-CM | POA: Diagnosis not present

## 2019-08-28 DIAGNOSIS — R972 Elevated prostate specific antigen [PSA]: Secondary | ICD-10-CM | POA: Diagnosis not present

## 2019-08-28 DIAGNOSIS — R5383 Other fatigue: Secondary | ICD-10-CM | POA: Diagnosis not present

## 2019-08-28 NOTE — Progress Notes (Signed)
[name] presents today for phlebotomy per MD orders. Phlebotomy procedure started at 1356 and ended at 1404.  500 cc removed. Patient tolerated procedure well. IV needle removed intact.  Patient verbalized he felt fine after procedure.   Vitals stable and discharged home from clinic ambulatory. Follow up as scheduled.

## 2019-08-28 NOTE — Patient Instructions (Signed)
[facility] at [facility]  Discharge Instructions:   _______________________________________________________________  Thank you for choosing [facility] at [facility] to provide your oncology and hematology care.  To afford each patient quality time with our providers, please arrive at least 15 minutes before your scheduled appointment.  You need to re-schedule your appointment if you arrive 10 or more minutes late.  We strive to give you quality time with our providers, and arriving late affects you and other patients whose appointments are after yours.  Also, if you no show three or more times for appointments you may be dismissed from the clinic.  Again, thank you for choosing [facility] at [facility]. Our hope is that these requests will allow you access to exceptional care and in a timely manner. _______________________________________________________________  If you have questions after your visit, please contact our office at [phone] between the hours of 8:30 a.m. and 5:00 p.m. Voicemails left after 4:30 p.m. will not be returned until the following business day. _______________________________________________________________  For prescription refill requests, have your pharmacy contact our office. _______________________________________________________________  Recommendations made by the consultant and any test results will be sent to your referring physician. _______________________________________________________________ 

## 2019-09-07 ENCOUNTER — Other Ambulatory Visit: Payer: Self-pay

## 2019-09-08 ENCOUNTER — Encounter: Payer: Self-pay | Admitting: Family Medicine

## 2019-09-08 ENCOUNTER — Ambulatory Visit (INDEPENDENT_AMBULATORY_CARE_PROVIDER_SITE_OTHER): Payer: Medicare Other | Admitting: Family Medicine

## 2019-09-08 VITALS — BP 128/85 | HR 73 | Temp 97.3°F | Resp 18 | Ht 71.0 in | Wt 259.0 lb

## 2019-09-08 DIAGNOSIS — R6889 Other general symptoms and signs: Secondary | ICD-10-CM | POA: Diagnosis not present

## 2019-09-08 DIAGNOSIS — E1169 Type 2 diabetes mellitus with other specified complication: Secondary | ICD-10-CM | POA: Diagnosis not present

## 2019-09-08 DIAGNOSIS — R972 Elevated prostate specific antigen [PSA]: Secondary | ICD-10-CM | POA: Diagnosis not present

## 2019-09-08 DIAGNOSIS — E1159 Type 2 diabetes mellitus with other circulatory complications: Secondary | ICD-10-CM

## 2019-09-08 DIAGNOSIS — E669 Obesity, unspecified: Secondary | ICD-10-CM

## 2019-09-08 DIAGNOSIS — I10 Essential (primary) hypertension: Secondary | ICD-10-CM | POA: Diagnosis not present

## 2019-09-08 LAB — BAYER DCA HB A1C WAIVED: HB A1C (BAYER DCA - WAIVED): 8.4 % — ABNORMAL HIGH (ref <7.0)

## 2019-09-08 MED ORDER — IRBESARTAN 150 MG PO TABS: 150.0000 mg | ORAL_TABLET | Freq: Every day | ORAL | 3 refills | Status: DC

## 2019-09-08 MED ORDER — METFORMIN HCL 1000 MG PO TABS: 1000.0000 mg | ORAL_TABLET | Freq: Two times a day (BID) | ORAL | 3 refills | Status: DC

## 2019-09-08 NOTE — Progress Notes (Signed)
BP 128/85   Pulse 73   Temp (!) 97.3 F (36.3 C) (Temporal)   Resp 18   Ht 5\' 11"  (1.803 m)   Wt 259 lb (117.5 kg)   SpO2 93%   BMI 36.12 kg/m    Subjective:   Patient ID: [name], male    DOB: [birth year], 68 y.o.   MRN: [id number]  HPI: [name] is a 68 y.o. male presenting on 09/08/2019 for Diabetes (4 month follow up)   HPI Type 2 diabetes mellitus Patient comes in today for recheck of his diabetes. Patient has been currently taking metformin, his last blood sugar was 102 just couple days ago. Patient is currently on an ACE inhibitor/ARB. Patient has not seen an ophthalmologist this year. Patient denies any issues with their feet.   Hypertension Patient is currently on irbesartan, and their blood pressure today is 128/85. Patient denies any lightheadedness or dizziness. Patient denies headaches, blurred vision, chest pains, shortness of breath, or weakness. Denies any side effects from medication and is content with current medication.   Patient has been having some heat intolerance and trouble with sleeping because of the heat intolerance.  We will check his thyroid today.  He denies any major weight loss or weight gain or difficulties.  He continues to see his hematologist for polycythemia  Relevant past medical, surgical, family and social history reviewed and updated as indicated. Interim medical history since our last visit reviewed. Allergies and medications reviewed and updated.  Review of Systems  Constitutional: Negative for chills and fever.  Respiratory: Negative for shortness of breath and wheezing.   Cardiovascular: Negative for chest pain and leg swelling.  Endocrine: Positive for heat intolerance.  Musculoskeletal: Negative for back pain and gait problem.  Skin: Negative for rash.  All other systems reviewed and are negative.   Per HPI unless specifically indicated above   Allergies as of 09/08/2019      Reactions   Ramipril Cough     Medication List       Accurate as of September 08, 2019  9:35 AM. If you have any questions, ask your nurse or doctor.        aspirin EC 81 MG tablet Take 81 mg by mouth every evening.   CENTRUM SILVER 50+MEN PO Take 1 tablet by mouth daily. Every now and then   Fish Oil 500 MG Caps Take 500 mg by mouth daily.   glucose blood test strip Use to check blood glucose once daily at varying times.  Dispense Verio One Touch Test strips   irbesartan 150 MG tablet Commonly known as: AVAPRO TAKE 1 TABLET BY MOUTH EVERY DAY   metFORMIN 1000 MG tablet Commonly known as: GLUCOPHAGE Take 1 tablet (1,000 mg total) by mouth 2 (two) times daily with a meal.   OneTouch Delica Lancets 99991111 Misc Use to check blood glucose once daily at varying times   oxymetazoline 0.05 % nasal spray Commonly known as: AFRIN Place 1 spray into both nostrils at bedtime as needed for congestion.   PROBIOTIC PO Take 1 capsule by mouth 3 (three) times a week.   tamsulosin 0.4 MG Caps capsule Commonly known as: FLOMAX Take 0.4 mg by mouth daily.        Objective:   BP 128/85   Pulse 73   Temp (!) 97.3 F (36.3 C) (Temporal)   Resp 18   Ht 5\' 11"  (1.803 m)   Wt 259 lb (117.5 kg)   SpO2 93%  BMI 36.12 kg/m   Wt Readings from Last 3 Encounters:  09/08/19 259 lb (117.5 kg)  08/24/19 262 lb (118.8 kg)  06/13/19 260 lb 12.8 oz (118.3 kg)    Physical Exam Vitals signs and nursing note reviewed.  Constitutional:      General: He is not in acute distress.    Appearance: He is well-developed. He is not diaphoretic.  Eyes:     General: No scleral icterus.    Conjunctiva/sclera: Conjunctivae normal.  Neck:     Musculoskeletal: Neck supple.     Thyroid: No thyromegaly.  Cardiovascular:     Rate and Rhythm: Normal rate and regular rhythm.     Heart sounds: Normal heart sounds. No murmur.  Pulmonary:     Effort: Pulmonary effort is normal. No respiratory distress.     Breath sounds: Normal  breath sounds. No wheezing.  Musculoskeletal: Normal range of motion.  Lymphadenopathy:     Cervical: No cervical adenopathy.  Skin:    General: Skin is warm and dry.     Findings: No rash.  Neurological:     Mental Status: He is alert and oriented to person, place, and time.     Coordination: Coordination normal.  Psychiatric:        Behavior: Behavior normal.       Assessment & Plan:   Problem List Items Addressed This Visit      Cardiovascular and Mediastinum   Hypertension associated with diabetes ([place])   Relevant Medications   metFORMIN (GLUCOPHAGE) 1000 MG tablet   irbesartan (AVAPRO) 150 MG tablet     Endocrine   Diabetes mellitus type 2 in obese (HCC) - Primary   Relevant Medications   metFORMIN (GLUCOPHAGE) 1000 MG tablet   irbesartan (AVAPRO) 150 MG tablet   Other Relevant Orders   Bayer DCA Hb A1c Waived   Microalbumin / creatinine urine ratio     Other   Elevated PSA    Other Visit Diagnoses    Heat intolerance       Relevant Orders   TSH       Follow up plan: No follow-ups on file.  Counseling provided for all of the vaccine components Orders Placed This Encounter  Procedures  . Bayer DCA Hb A1c Waived  . Microalbumin / creatinine urine ratio    Caryl Pina, MD [facility] 09/08/2019, 9:35 AM

## 2019-09-09 LAB — TSH: TSH: 4.01 u[IU]/mL (ref 0.450–4.500)

## 2019-09-09 LAB — MICROALBUMIN / CREATININE URINE RATIO
Creatinine, Urine: 146.2 mg/dL
Microalb/Creat Ratio: 11 mg/g creat (ref 0–29)
Microalbumin, Urine: 16.1 ug/mL

## 2019-09-13 ENCOUNTER — Ambulatory Visit (INDEPENDENT_AMBULATORY_CARE_PROVIDER_SITE_OTHER): Payer: Medicare Other | Admitting: *Deleted

## 2019-09-13 DIAGNOSIS — Z0000 Encounter for general adult medical examination without abnormal findings: Secondary | ICD-10-CM | POA: Diagnosis not present

## 2019-09-13 NOTE — Progress Notes (Signed)
MEDICARE ANNUAL WELLNESS VISIT  09/13/2019  Telephone Visit Disclaimer This Medicare AWV was conducted by telephone due to national recommendations for restrictions regarding the COVID-19 Pandemic (e.g. social distancing).  I verified, using two identifiers, that I am speaking with [name] or their authorized healthcare agent. I discussed the limitations, risks, security, and privacy concerns of performing an evaluation and management service by telephone and the potential availability of an in-person appointment in the future. The patient expressed understanding and agreed to proceed.   Subjective:  [name] is a 68 y.o. male patient of Dettinger, Timothy Kaufmann, MD who had a Medicare Annual Wellness Visit today via telephone. 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 he has 0 children. he reports that he is socially active and does interact with friends/family regularly. he is moderately physically active and enjoys reading, walking, gardening and taking care of his "furbabies".  Patient Care Team: Dettinger, Timothy Kaufmann, MD as PCP - General (Family Medicine) Timothy Binder, MD as Consulting Physician (Gastroenterology)  Advanced Directives 09/13/2019 08/24/2019 06/13/2019 04/20/2019 04/13/2019 04/12/2019 02/08/2019  Does Patient Have a Medical Advance Directive? No No No No No No No  Does patient want to make changes to medical advance directive? - - No - Patient declined No - Patient declined No - Patient declined No - Patient declined -  Would patient like information on creating a medical advance directive? No - Patient declined No - Patient declined No - Patient declined No - Patient declined No - Patient declined No - Patient declined No - Patient declined    Hospital Utilization Over the Past 12 Months: # of hospitalizations or ER visits: 0 # of surgeries: 1  Review of Systems    Patient reports that his overall health is better compared to last  year.  History obtained from chart review  Patient Reported Readings (BP, Pulse, CBG, Weight, etc) none  Pain Assessment Pain : No/denies pain(pt states no pain out of the ordinary-some bilateral knee discomfort)     Current Medications & Allergies (verified) Allergies as of 09/13/2019      Reactions   Ramipril Cough      Medication List       Accurate as of September 13, 2019 10:54 AM. If you have any questions, ask your nurse or doctor.        aspirin EC 81 MG tablet Take 81 mg by mouth every evening.   CENTRUM SILVER 50+MEN PO Take 1 tablet by mouth daily. Every now and then   Fish Oil 500 MG Caps Take 500 mg by mouth 2 (two) times a week.   glucose blood test strip Use to check blood glucose once daily at varying times.  Dispense Verio One Touch Test strips   irbesartan 150 MG tablet Commonly known as: AVAPRO Take 1 tablet (150 mg total) by mouth daily.   metFORMIN 1000 MG tablet Commonly known as: GLUCOPHAGE Take 1 tablet (1,000 mg total) by mouth 2 (two) times daily with a meal.   OneTouch Delica Lancets 99991111 Misc Use to check blood glucose once daily at varying times   oxymetazoline 0.05 % nasal spray Commonly known as: AFRIN Place 1 spray into both nostrils at bedtime as needed for congestion.   PROBIOTIC PO Take 1 capsule by mouth 3 (three) times a week.   raNITIdine 150 Max Strength 150 MG tablet Generic drug: ranitidine Take 150 mg by mouth 2 (two) times daily.  tamsulosin 0.4 MG Caps capsule Commonly known as: FLOMAX Take 0.4 mg by mouth daily.       History (reviewed): Past Medical History:  Diagnosis Date  . Diabetes mellitus without complication ([place])   . GERD (gastroesophageal reflux disease)   . Hx of [place] spotted fever 1-1-12011  . Hypertension   . Sleep apnea    wears cpap    Past Surgical History:  Procedure Laterality Date  . COLONOSCOPY  09/2012   Dr. Britta Mccreedy. Diverticulosis, descending adenomatous colon  polyp, hyperplastic rectal polyp. ?mass in anal canal evaluated by surgery and consistent with internal hemorrhoid  . COLONOSCOPY WITH PROPOFOL N/A 02/07/2019   Procedure: COLONOSCOPY WITH PROPOFOL;  Surgeon: Timothy Binder, MD;  Location: [facility];  Service: Endoscopy;  Laterality: N/A;  9:30am  . KNEE SURGERY Left 10/2014  . KNEE SURGERY Right 09/2015  . POLYPECTOMY  02/07/2019   Procedure: POLYPECTOMY;  Surgeon: Timothy Binder, MD;  Location: [facility];  Service: Endoscopy;;   Family History  Problem Relation Age of Onset  . Heart disease Mother   . Congestive Heart Failure Mother   . Diabetes Father   . Aneurysm Sister   . Diabetes Paternal Uncle   . Diabetes Paternal Grandmother   . Colon cancer Neg Hx   . Colon polyps Neg Hx    Social History   Socioeconomic History  . Marital status: Single    Spouse name: Not on file  . Number of children: 0  . Years of education: 14  . Highest education level: 12th grade  Occupational History  . Occupation: Retired    Comment: Pensions consultant and gamble  Social Needs  . Financial resource strain: Not hard at all  . Food insecurity    Worry: Never true    Inability: Never true  . Transportation needs    Medical: No    Non-medical: No  Tobacco Use  . Smoking status: Never Smoker  . Smokeless tobacco: Never Used  Substance and Sexual Activity  . Alcohol use: No  . Drug use: No  . Sexual activity: Not Currently  Lifestyle  . Physical activity    Days per week: 7 days    Minutes per session: 50 min  . Stress: Not at all  Relationships  . Social connections    Talks on phone: More than three times a week    Gets together: Three times a week    Attends religious service: More than 4 times per year    Active member of club or organization: Yes    Attends meetings of clubs or organizations: More than 4 times per year    Relationship status: Never married  Other Topics Concern  . Not on file  Social History Narrative    WORKED AT [employer] > 10 YRS. NOT MARRIED. FUR BABIES: 3 DOGS AND 5 CATS. SPENDS FREE TIME: [place], [place], [place].    Activities of Daily Living In your present state of health, do you have any difficulty performing the following activities: 09/13/2019  Hearing? Y  Comment if people talk "low" has a hard time hearing unless he is looking straight at them  Vision? N  Comment wears glasses-gets yearly eye exams and has an appointment scheduled in December 2020  Difficulty concentrating or making decisions? N  Walking or climbing stairs? N  Dressing or bathing? N  Doing errands, shopping? N  Preparing Food and eating ? N  Using the Toilet?  N  In the past six months, have you accidently leaked urine? N  Do you have problems with loss of bowel control? N  Managing your Medications? N  Managing your Finances? N  Housekeeping or managing your Housekeeping? N  Some recent data might be hidden    Patient Education/ Literacy How often do you need to have someone help you when you read instructions, pamphlets, or other written materials from your doctor or pharmacy?: 1 - Never What is the last grade level you completed in school?: 12th grade  Exercise Current Exercise Habits: Home exercise routine, Type of exercise: walking, Time (Minutes): 50, Frequency (Times/Week): 7, Weekly Exercise (Minutes/Week): 350, Intensity: Moderate, Exercise limited by: None identified  Diet Patient reports consuming 3 meals a day and 2 snack(s) a day Patient reports that his primary diet is: Regular Patient reports that she does have regular access to food.   Depression Screen PHQ 2/9 Scores 09/13/2019 09/08/2019 12/22/2018 05/24/2018 05/03/2018 04/15/2018 12/23/2017  PHQ - 2 Score 0 0 1 0 0 0 0  PHQ- 9 Score - - - - - - -     Fall Risk Fall Risk  09/13/2019 09/08/2019 12/22/2018 05/24/2018 05/03/2018  Falls in the past year? 0 0 0 No No  Number falls in past yr: 0 - - - -  Injury with Fall? 0 - - - -   Follow up Falls prevention discussed - - - -  Comment Get rid of all throw rugs in the house, adequate lighting in the walkways and grab bars in the bathroom. - - - -     Objective:  [name] seemed alert and oriented and he participated appropriately during our telephone visit.  Blood Pressure Weight BMI  BP Readings from Last 3 Encounters:  09/08/19 128/85  08/28/19 123/83  08/24/19 135/71   Wt Readings from Last 3 Encounters:  09/08/19 259 lb (117.5 kg)  08/24/19 262 lb (118.8 kg)  06/13/19 260 lb 12.8 oz (118.3 kg)   BMI Readings from Last 1 Encounters:  09/08/19 36.12 kg/m    *Unable to obtain current vital signs, weight, and BMI due to telephone visit type  Hearing/Vision  . Garcia did not seem to have difficulty with hearing/understanding during the telephone conversation . Reports that he has not had a formal eye exam by an eye care professional within the past year . Reports that he has not had a formal hearing evaluation within the past year *Unable to fully assess hearing and vision during telephone visit type  Cognitive Function: 6CIT Screen 09/13/2019  What Year? 0 points  What month? 0 points  What time? 0 points  Count back from 20 0 points  Months in reverse 0 points  Repeat phrase 2 points  Total Score 2   (Normal:0-7, Significant for Dysfunction: >8)  Normal Cognitive Function Screening: Yes   Immunization & Health Maintenance Record Immunization History  Administered Date(s) Administered  . Influenza,inj,Quad PF,6+ Mos 09/25/2014, 10/22/2016, 09/22/2017, 09/23/2018  . Tdap 09/25/2014  . Zoster Recombinat (Shingrix) 04/20/2018    Health Maintenance  Topic Date Due  . OPHTHALMOLOGY EXAM  03/16/1961  . INFLUENZA VACCINE  10/13/2019 (Originally 07/15/2019)  . PNA vac Low Risk Adult (1 of 2 - PCV13) 09/07/2020 (Originally 03/16/2017)  . HEMOGLOBIN A1C  03/07/2020  . FOOT EXAM  09/07/2020  . TETANUS/TDAP  09/25/2024  . COLONOSCOPY   02/07/2029  . Hepatitis C Screening  Completed       Assessment  This is  a routine wellness examination for [employer].  Health Maintenance: Due or Overdue Health Maintenance Due  Topic Date Due  . OPHTHALMOLOGY EXAM  03[birth year]16/1961    [name] does not need a referral for Community Assistance: Care Management:   no Social Work:    no Prescription Assistance:  no Nutrition/Diabetes Education:  no   Plan:  Personalized Goals Goals Addressed            This Visit's Progress   . DIET - INCREASE WATER INTAKE       Try to drink 6-8 glasses of water daily.      Personalized Health Maintenance & Screening Recommendations  Pneumococcal vaccine  Influenza vaccine Advanced directives: has NO advanced directive - not interested in additional information 2nd Shingrix to complete the series  Lung Cancer Screening Recommended: no (Low Dose CT Chest recommended if Age 86-80 years, 30 pack-year currently smoking OR have quit w/in past 15 years) Hepatitis C Screening recommended: no HIV Screening recommended: no  Advanced Directives: Written information was not prepared per patient's request.  Referrals & Orders No orders of the defined types were placed in this encounter.   Follow-up Plan . Follow-up with Dettinger, Timothy Kaufmann, MD as planned . Keep your Diabetic Eye Exam scheduled in December as discussed . Consider Prevnar13, Flu and Shingrix vaccines at your next visit with your PCP   I have personally reviewed and noted the following in the patient's chart:   . Medical and social history . Use of alcohol, tobacco or illicit drugs  . Current medications and supplements . Functional ability and status . Nutritional status . Physical activity . Advanced directives . List of other physicians . Hospitalizations, surgeries, and ER visits in previous 12 months . Vitals . Screenings to include cognitive, depression, and falls . Referrals and appointments  In  addition, I have reviewed and discussed with [name] certain preventive protocols, quality metrics, and best practice recommendations. A written personalized care plan for preventive services as well as general preventive health recommendations is available and can be mailed to the patient at his request.      Milas Hock, LPN  [id number]

## 2019-09-13 NOTE — Patient Instructions (Signed)
Preventive Care 75 Years and Older, Male Preventive care refers to lifestyle choices and visits with your health care provider that can promote health and wellness. This includes:  A yearly physical exam. This is also called an annual well check.  Regular dental and eye exams.  Immunizations.  Screening for certain conditions.  Healthy lifestyle choices, such as diet and exercise. What can I expect for my preventive care visit? Physical exam Your health care provider will check:  Height and weight. These may be used to calculate body mass index (BMI), which is a measurement that tells if you are at a healthy weight.  Heart rate and blood pressure.  Your skin for abnormal spots. Counseling Your health care provider may ask you questions about:  Alcohol, tobacco, and drug use.  Emotional well-being.  Home and relationship well-being.  Sexual activity.  Eating habits.  History of falls.  Memory and ability to understand (cognition).  Work and work Statistician. What immunizations do I need?  Influenza (flu) vaccine  This is recommended every year. Tetanus, diphtheria, and pertussis (Tdap) vaccine  You may need a Td booster every 10 years. Varicella (chickenpox) vaccine  You may need this vaccine if you have not already been vaccinated. Zoster (shingles) vaccine  You may need this after age 50. Pneumococcal conjugate (PCV13) vaccine  One dose is recommended after age 24. Pneumococcal polysaccharide (PPSV23) vaccine  One dose is recommended after age 33. Measles, mumps, and rubella (MMR) vaccine  You may need at least one dose of MMR if you were born in 1957 or later. You may also need a second dose. Meningococcal conjugate (MenACWY) vaccine  You may need this if you have certain conditions. Hepatitis A vaccine  You may need this if you have certain conditions or if you travel or work in places where you may be exposed to hepatitis A. Hepatitis B vaccine   You may need this if you have certain conditions or if you travel or work in places where you may be exposed to hepatitis B. Haemophilus influenzae type b (Hib) vaccine  You may need this if you have certain conditions. You may receive vaccines as individual doses or as more than one vaccine together in one shot (combination vaccines). Talk with your health care provider about the risks and benefits of combination vaccines. What tests do I need? Blood tests  Lipid and cholesterol levels. These may be checked every 5 years, or more frequently depending on your overall health.  Hepatitis C test.  Hepatitis B test. Screening  Lung cancer screening. You may have this screening every year starting at age 74 if you have a 30-pack-year history of smoking and currently smoke or have quit within the past 15 years.  Colorectal cancer screening. All adults should have this screening starting at age 57 and continuing until age 54. Your health care provider may recommend screening at age 47 if you are at increased risk. You will have tests every 1-10 years, depending on your results and the type of screening test.  Prostate cancer screening. Recommendations will vary depending on your family history and other risks.  Diabetes screening. This is done by checking your blood sugar (glucose) after you have not eaten for a while (fasting). You may have this done every 1-3 years.  Abdominal aortic aneurysm (AAA) screening. You may need this if you are a current or former smoker.  Sexually transmitted disease (STD) testing. Follow these instructions at home: Eating and drinking  Eat  a diet that includes fresh fruits and vegetables, whole grains, lean protein, and low-fat dairy products. Limit your intake of foods with high amounts of sugar, saturated fats, and salt.  Take vitamin and mineral supplements as recommended by your health care provider.  Do not drink alcohol if your health care provider  tells you not to drink.  If you drink alcohol: ? Limit how much you have to 0-2 drinks a day. ? Be aware of how much alcohol is in your drink. In the [place] one drink equals one 12 oz bottle of beer (355 mL), one 5 oz glass of wine (148 mL), or one 1 oz glass of hard liquor (44 mL). Lifestyle  Take daily care of your teeth and gums.  Stay active. Exercise for at least 30 minutes on 5 or more days each week.  Do not use any products that contain nicotine or tobacco, such as cigarettes, e-cigarettes, and chewing tobacco. If you need help quitting, ask your health care provider.  If you are sexually active, practice safe sex. Use a condom or other form of protection to prevent STIs (sexually transmitted infections).  Talk with your health care provider about taking a low-dose aspirin or statin. What's next?  Visit your health care provider once a year for a well check visit.  Ask your health care provider how often you should have your eyes and teeth checked.  Stay up to date on all vaccines. This information is not intended to replace advice given to you by your health care provider. Make sure you discuss any questions you have with your health care provider. Document Released: 12/27/2015 Document Revised: 11/24/2018 Document Reviewed: 11/24/2018 Elsevier Patient Education  2020 [employer].

## 2019-09-14 ENCOUNTER — Telehealth: Payer: Self-pay | Admitting: *Deleted

## 2019-09-14 MED ORDER — GABAPENTIN 100 MG PO CAPS: 100.0000 mg | ORAL_CAPSULE | Freq: Every day | ORAL | 1 refills | Status: DC

## 2019-09-14 NOTE — Telephone Encounter (Signed)
Left message stating Rx has been sent to the pharmacy and to call back with any questions or concerns.

## 2019-09-14 NOTE — Telephone Encounter (Signed)
Patient states that he was suppose to receive a Rx for Gabapentin to help him sleep.  Not on current med list.

## 2019-09-14 NOTE — Addendum Note (Signed)
Addended by: Caryl Pina on: 09/14/2019 01:33 PM   Modules accepted: Orders

## 2019-09-27 ENCOUNTER — Encounter (HOSPITAL_COMMUNITY): Payer: Self-pay

## 2019-09-27 ENCOUNTER — Other Ambulatory Visit: Payer: Self-pay

## 2019-09-27 ENCOUNTER — Inpatient Hospital Stay (HOSPITAL_COMMUNITY): Payer: Medicare Other | Attending: Internal Medicine

## 2019-09-27 DIAGNOSIS — D751 Secondary polycythemia: Secondary | ICD-10-CM | POA: Insufficient documentation

## 2019-09-27 NOTE — Patient Instructions (Signed)

## 2019-09-27 NOTE — Progress Notes (Signed)
[name] presents today for phlebotomy per MD orders. Phlebotomy procedure started at 1353 and ended at 1358. 500 cc removed. Patient tolerated procedure well. IV needle removed intact.  Patient tolerated procedure with no complaints voiced.  Denied nausea, dizziness, chest pain, and SOB.  Peripheral site clean and dry with no bruising or swelling noted at site. Band aid applied.  VSs with discharge and left ambulatory with no s/s of distress noted.

## 2019-10-03 ENCOUNTER — Other Ambulatory Visit: Payer: Self-pay | Admitting: Family Medicine

## 2019-10-03 DIAGNOSIS — E1159 Type 2 diabetes mellitus with other circulatory complications: Secondary | ICD-10-CM

## 2019-10-03 DIAGNOSIS — I152 Hypertension secondary to endocrine disorders: Secondary | ICD-10-CM

## 2019-10-10 ENCOUNTER — Ambulatory Visit (INDEPENDENT_AMBULATORY_CARE_PROVIDER_SITE_OTHER): Payer: Medicare Other

## 2019-10-10 DIAGNOSIS — Z23 Encounter for immunization: Secondary | ICD-10-CM | POA: Diagnosis not present

## 2019-10-19 ENCOUNTER — Inpatient Hospital Stay (HOSPITAL_COMMUNITY): Payer: Medicare Other | Attending: Hematology

## 2019-10-19 ENCOUNTER — Other Ambulatory Visit: Payer: Self-pay

## 2019-10-19 DIAGNOSIS — Z7984 Long term (current) use of oral hypoglycemic drugs: Secondary | ICD-10-CM | POA: Insufficient documentation

## 2019-10-19 DIAGNOSIS — D751 Secondary polycythemia: Secondary | ICD-10-CM | POA: Diagnosis not present

## 2019-10-19 DIAGNOSIS — B1910 Unspecified viral hepatitis B without hepatic coma: Secondary | ICD-10-CM | POA: Insufficient documentation

## 2019-10-19 DIAGNOSIS — I10 Essential (primary) hypertension: Secondary | ICD-10-CM | POA: Insufficient documentation

## 2019-10-19 DIAGNOSIS — Z7982 Long term (current) use of aspirin: Secondary | ICD-10-CM | POA: Insufficient documentation

## 2019-10-19 DIAGNOSIS — Z87891 Personal history of nicotine dependence: Secondary | ICD-10-CM | POA: Diagnosis not present

## 2019-10-19 DIAGNOSIS — K219 Gastro-esophageal reflux disease without esophagitis: Secondary | ICD-10-CM | POA: Insufficient documentation

## 2019-10-19 DIAGNOSIS — R972 Elevated prostate specific antigen [PSA]: Secondary | ICD-10-CM | POA: Diagnosis not present

## 2019-10-19 DIAGNOSIS — G4730 Sleep apnea, unspecified: Secondary | ICD-10-CM | POA: Insufficient documentation

## 2019-10-19 DIAGNOSIS — Z79899 Other long term (current) drug therapy: Secondary | ICD-10-CM | POA: Insufficient documentation

## 2019-10-19 DIAGNOSIS — D119 Benign neoplasm of major salivary gland, unspecified: Secondary | ICD-10-CM | POA: Diagnosis not present

## 2019-10-19 LAB — CBC WITH DIFFERENTIAL/PLATELET
Abs Immature Granulocytes: 0.02 10*3/uL (ref 0.00–0.07)
Basophils Absolute: 0.1 10*3/uL (ref 0.0–0.1)
Basophils Relative: 1 %
Eosinophils Absolute: 0.2 10*3/uL (ref 0.0–0.5)
Eosinophils Relative: 3 %
HCT: 46.1 % (ref 39.0–52.0)
Hemoglobin: 14.9 g/dL (ref 13.0–17.0)
Immature Granulocytes: 0 %
Lymphocytes Relative: 23 %
Lymphs Abs: 1.6 10*3/uL (ref 0.7–4.0)
MCH: 29.3 pg (ref 26.0–34.0)
MCHC: 32.3 g/dL (ref 30.0–36.0)
MCV: 90.7 fL (ref 80.0–100.0)
Monocytes Absolute: 0.6 10*3/uL (ref 0.1–1.0)
Monocytes Relative: 8 %
Neutro Abs: 4.5 10*3/uL (ref 1.7–7.7)
Neutrophils Relative %: 65 %
Platelets: 249 10*3/uL (ref 150–400)
RBC: 5.08 MIL/uL (ref 4.22–5.81)
RDW: 12.5 % (ref 11.5–15.5)
WBC: 6.9 10*3/uL (ref 4.0–10.5)
nRBC: 0 % (ref 0.0–0.2)

## 2019-10-19 LAB — COMPREHENSIVE METABOLIC PANEL
ALT: 20 U/L (ref 0–44)
AST: 20 U/L (ref 15–41)
Albumin: 3.9 g/dL (ref 3.5–5.0)
Alkaline Phosphatase: 74 U/L (ref 38–126)
Anion gap: 9 (ref 5–15)
BUN: 18 mg/dL (ref 8–23)
CO2: 25 mmol/L (ref 22–32)
Calcium: 9.3 mg/dL (ref 8.9–10.3)
Chloride: 104 mmol/L (ref 98–111)
Creatinine, Ser: 1.15 mg/dL (ref 0.61–1.24)
GFR calc Af Amer: >60 mL/min (ref >60)
GFR calc non Af Amer: >60 mL/min (ref >60)
Glucose, Bld: 193 mg/dL — ABNORMAL HIGH (ref 70–99)
Potassium: 4.2 mmol/L (ref 3.5–5.1)
Sodium: 138 mmol/L (ref 135–145)
Total Bilirubin: 0.6 mg/dL (ref 0.3–1.2)
Total Protein: 7.2 g/dL (ref 6.5–8.1)

## 2019-10-19 LAB — IRON AND TIBC
Iron: 37 ug/dL — ABNORMAL LOW (ref 45–182)
Saturation Ratios: 8 % — ABNORMAL LOW (ref 17.9–39.5)
TIBC: 440 ug/dL (ref 250–450)
UIBC: 403 ug/dL

## 2019-10-19 LAB — LACTATE DEHYDROGENASE: LDH: 136 U/L (ref 98–192)

## 2019-10-19 LAB — VITAMIN B12: Vitamin B-12: 282 pg/mL (ref 180–914)

## 2019-10-19 LAB — TSH: TSH: 2.389 u[IU]/mL (ref 0.350–4.500)

## 2019-10-19 LAB — VITAMIN D 25 HYDROXY (VIT D DEFICIENCY, FRACTURES): Vit D, 25-Hydroxy: 28.88 ng/mL — ABNORMAL LOW (ref 30–100)

## 2019-10-19 LAB — FERRITIN: Ferritin: 11 ng/mL — ABNORMAL LOW (ref 24–336)

## 2019-10-26 ENCOUNTER — Other Ambulatory Visit: Payer: Self-pay

## 2019-10-26 ENCOUNTER — Inpatient Hospital Stay (HOSPITAL_BASED_OUTPATIENT_CLINIC_OR_DEPARTMENT_OTHER): Payer: Medicare Other | Admitting: Nurse Practitioner

## 2019-10-26 ENCOUNTER — Encounter (HOSPITAL_COMMUNITY): Payer: Self-pay | Admitting: Nurse Practitioner

## 2019-10-26 DIAGNOSIS — K219 Gastro-esophageal reflux disease without esophagitis: Secondary | ICD-10-CM | POA: Diagnosis not present

## 2019-10-26 DIAGNOSIS — I10 Essential (primary) hypertension: Secondary | ICD-10-CM | POA: Diagnosis not present

## 2019-10-26 DIAGNOSIS — B1910 Unspecified viral hepatitis B without hepatic coma: Secondary | ICD-10-CM | POA: Diagnosis not present

## 2019-10-26 DIAGNOSIS — R972 Elevated prostate specific antigen [PSA]: Secondary | ICD-10-CM | POA: Diagnosis not present

## 2019-10-26 DIAGNOSIS — D751 Secondary polycythemia: Secondary | ICD-10-CM

## 2019-10-26 DIAGNOSIS — D119 Benign neoplasm of major salivary gland, unspecified: Secondary | ICD-10-CM | POA: Diagnosis not present

## 2019-10-26 NOTE — Progress Notes (Signed)
[facility] [place], [place] [address]   CLINIC:  Medical Oncology/Hematology  PCP:  Dettinger, Fransisca Kaufmann, MD [place] [id number] [phone]   REASON FOR VISIT: Follow-up for secondary polycythemia  CURRENT THERAPY: Intermittent phlebotomies    INTERVAL HISTORY:  [name] 68 y.o. male returns for routine follow-up for secondary polycythemia.  Patient reports he feels better after the phlebotomies and all of his symptoms go away. Denies any nausea, vomiting, or diarrhea. Denies any new pains. Had not noticed any recent bleeding such as epistaxis, hematuria or hematochezia. Denies recent chest pain on exertion, shortness of breath on minimal exertion, pre-syncopal episodes, or palpitations. Denies any numbness or tingling in hands or feet. Denies any recent fevers, infections, or recent hospitalizations. Patient reports appetite at 75% and energy level at 25%.  He is eating well maintain his weight this time.    REVIEW OF SYSTEMS:  Review of Systems  Gastrointestinal: Positive for diarrhea.  Neurological: Positive for numbness.  Psychiatric/Behavioral: Positive for sleep disturbance.  All other systems reviewed and are negative.    PAST MEDICAL/SURGICAL HISTORY:  Past Medical History:  Diagnosis Date  . Diabetes mellitus without complication ([place])   . GERD (gastroesophageal reflux disease)   . Hx of [place] spotted fever 1-1-12011  . Hypertension   . Sleep apnea    wears cpap    Past Surgical History:  Procedure Laterality Date  . COLONOSCOPY  09/2012   Dr. Britta Mccreedy. Diverticulosis, descending adenomatous colon polyp, hyperplastic rectal polyp. ?mass in anal canal evaluated by surgery and consistent with internal hemorrhoid  . COLONOSCOPY WITH PROPOFOL N/A 02/07/2019   Procedure: COLONOSCOPY WITH PROPOFOL;  Surgeon: Danie Binder, MD;  [facility];  Service: Endoscopy;  Laterality: N/A;  9:30am  . KNEE  SURGERY Left 10/2014  . KNEE SURGERY Right 09/2015  . POLYPECTOMY  02/07/2019   Procedure: POLYPECTOMY;  Surgeon: Danie Binder, MD;  Location: [facility];  Service: Endoscopy;;     SOCIAL HISTORY:  Social History   Socioeconomic History  . Marital status: Single    Spouse name: Not on file  . Number of children: 0  . Years of education: 25  . Highest education level: 12th grade  Occupational History  . Occupation: Retired    Comment: Pensions consultant and gamble  Social Needs  . Financial resource strain: Not hard at all  . Food insecurity    Worry: Never true    Inability: Never true  . Transportation needs    Medical: No    Non-medical: No  Tobacco Use  . Smoking status: Never Smoker  . Smokeless tobacco: Never Used  Substance and Sexual Activity  . Alcohol use: No  . Drug use: No  . Sexual activity: Not Currently  Lifestyle  . Physical activity    Days per week: 7 days    Minutes per session: 50 min  . Stress: Not at all  Relationships  . Social connections    Talks on phone: More than three times a week    Gets together: Three times a week    Attends religious service: More than 4 times per year    Active member of club or organization: Yes    Attends meetings of clubs or organizations: More than 4 times per year    Relationship status: Never married  . Intimate partner violence    Fear of current or ex partner: No  Emotionally abused: No    Physically abused: No    Forced sexual activity: No  Other Topics Concern  . Not on file  Social History Narrative   WORKED AT [employer] > 10 YRS. NOT MARRIED. FUR BABIES: 3 DOGS AND 5 CATS. SPENDS FREE TIME: [place], [place].    FAMILY HISTORY:  Family History  Problem Relation Age of Onset  . Heart disease Mother   . Congestive Heart Failure Mother   . Diabetes Father   . Aneurysm Sister   . Diabetes Paternal Uncle   . Diabetes Paternal Grandmother   . Colon cancer Neg Hx   . Colon  polyps Neg Hx     CURRENT MEDICATIONS:  Outpatient Encounter Medications as of 10/26/2019  Medication Sig  . aspirin EC 81 MG tablet Take 81 mg by mouth every evening.  . gabapentin (NEURONTIN) 100 MG capsule Take 1 capsule (100 mg total) by mouth at bedtime.  Marland Kitchen glucose blood test strip Use to check blood glucose once daily at varying times.  Dispense Verio One Touch Test strips  . irbesartan (AVAPRO) 150 MG tablet Take 1 tablet (150 mg total) by mouth daily.  . metFORMIN (GLUCOPHAGE) 1000 MG tablet Take 1 tablet (1,000 mg total) by mouth 2 (two) times daily with a meal.  . Multiple Vitamins-Minerals (CENTRUM SILVER 50+MEN PO) Take 1 tablet by mouth daily. Every now and then  . Omega-3 Fatty Acids (FISH OIL) 500 MG CAPS Take 500 mg by mouth 2 (two) times a week.   [name] DELICA LANCETS 99991111 MISC Use to check blood glucose once daily at varying times  . oxymetazoline (AFRIN) 0.05 % nasal spray Place 1 spray into both nostrils at bedtime as needed for congestion.  . Probiotic Product (PROBIOTIC PO) Take 1 capsule by mouth 3 (three) times a week.   . ranitidine (RANITIDINE 150 MAX STRENGTH) 150 MG tablet Take 150 mg by mouth 2 (two) times daily.  . tamsulosin (FLOMAX) 0.4 MG CAPS capsule Take 0.4 mg by mouth daily.   No facility-administered encounter medications on file as of 10/26/2019.     ALLERGIES:  Allergies  Allergen Reactions  . Ramipril Cough     PHYSICAL EXAM:  ECOG Performance status: 1  Vitals:   10/26/19 1426  BP: (!) 146/86  Pulse: 72  Resp: 18  Temp: (!) 97.3 F (36.3 C)  SpO2: 96%   Filed Weights   10/26/19 1426  Weight: 267 lb 3.2 oz (121.2 kg)    Physical Exam Constitutional:      Appearance: Normal appearance. He is normal weight.  Cardiovascular:     Rate and Rhythm: Normal rate and regular rhythm.     Heart sounds: Normal heart sounds.  Pulmonary:     Effort: Pulmonary effort is normal.     Breath sounds: Normal breath sounds.  Abdominal:      General: Bowel sounds are normal.     Palpations: Abdomen is soft.  Musculoskeletal: Normal range of motion.  Skin:    General: Skin is warm.  Neurological:     Mental Status: He is alert and oriented to person, place, and time. Mental status is at baseline.  Psychiatric:        Mood and Affect: Mood normal.        Behavior: Behavior normal.        Thought Content: Thought content normal.        Judgment: Judgment normal.  LABORATORY DATA:  I have reviewed the labs as listed.  CBC    Component Value Date/Time   WBC 6.9 10/19/2019 1240   RBC 5.08 10/19/2019 1240   HGB 14.9 10/19/2019 1240   HGB 18.5 (H) 12/23/2018 0804   HCT 46.1 10/19/2019 1240   HCT 53.3 (H) 12/23/2018 0804   PLT 249 10/19/2019 1240   PLT 246 12/23/2018 0804   MCV 90.7 10/19/2019 1240   MCV 95 12/23/2018 0804   MCH 29.3 10/19/2019 1240   MCHC 32.3 10/19/2019 1240   RDW 12.5 10/19/2019 1240   RDW 12.6 12/23/2018 0804   LYMPHSABS 1.6 10/19/2019 1240   LYMPHSABS 1.4 12/23/2018 0804   MONOABS 0.6 10/19/2019 1240   EOSABS 0.2 10/19/2019 1240   EOSABS 0.1 12/23/2018 0804   BASOSABS 0.1 10/19/2019 1240   BASOSABS 0.1 12/23/2018 0804   CMP Latest Ref Rng & Units 10/19/2019 08/23/2019 06/12/2019  Glucose 70 - 99 mg/dL 193(H) 141(H) 693(HH)  BUN 8 - 23 mg/dL 18 23 32(H)  Creatinine 0.61 - 1.24 mg/dL 1.15 1.00 1.29(H)  Sodium 135 - 145 mmol/L 138 133(L) 128(L)  Potassium 3.5 - 5.1 mmol/L 4.2 4.2 4.9  Chloride 98 - 111 mmol/L 104 103 92(L)  CO2 22 - 32 mmol/L 25 22 22   Calcium 8.9 - 10.3 mg/dL 9.3 9.2 9.7  Total Protein 6.5 - 8.1 g/dL 7.2 7.9 7.6  Total Bilirubin 0.3 - 1.2 mg/dL 0.6 1.0 1.0  Alkaline Phos 38 - 126 U/L 74 83 146(H)  AST 15 - 41 U/L 20 26 26   ALT 0 - 44 U/L 20 29 31     I personally performed a face-to-face visit.  All questions were answered to patient's stated satisfaction. Encouraged patient to call with any new concerns or questions before his next visit to the cancer center and we  can certain see him sooner, if needed.     ASSESSMENT & PLAN:   Polycythemia, secondary 1.  Secondary polycythemia: - Patient reports he clotted when he would go to donate blood.  He was always hot and very thirsty.  PCP noticed his hematocrit and hemoglobin were elevated and sent him to hematology. - His blood work was evaluated he was negative for HIV, Jak 2 and BCR/ABL.  EPO level was normal at 10.  SPEP, ANA, CRP, RF were all normal. - Hemochromatosis gene evaluation showed he had a single C282Y gene and is a carrier for hemochromatosis. - He also reports he has sleep apnea and uses a CPAP machine at night.  He was referred for another sleep study evaluation.  But they canceled his appointment due to the COVID crisis. -Patient denies smoking history.  Denies any headaches or blurred vision.  Denies any skin changes. - Patient had his first 2 500 mL phlebotomies on 02/09/2019 in 02/23/2019.  He reports he is not as hot or thirsty since the phlebotomies. -His last phlebotomy was on 08/28/2019 and 09/27/2019. -Labs on 10/19/2019 showed his hemoglobin 14.9 and hematocrit 46.1. -We will maintain his hematocrit around 45 this helps all of his symptoms to remain lower. -We will put in for a lab draw and possible phlebotomy in 1 month. -We will follow-up in 3 months with labs  2.  Sleep apnea: - We have discussed with the patient that obstructive sleep apnea may cause secondary polycythemia. - Patient has been referred for a sleep study evaluation. - The appointment was canceled due to the [place] crisis we will reschedule after. -Patient reports he uses  a CPAP machine at night  3.  Positive hep B serology: - Patient has positive hep B surface antibody and positive hep B core antibody. Surface antigen and IgM are negative.  Hep C and HIV are negative.  Patient is immune based on prior exposure.  4.  Elevated PSA: -Patient has a PSA of 7.5. -He is followed by advanced urology. -He had a prostate  biopsy on 03/30/2017 with 12 core submitted that were benign. -Patient will continue to follow-up with urology as directed.  5.  Health maintenance: -Patient had a colonoscopy on 02/07/2019 that showed a 5 mm polyp in the ascending colon that showed tubular adenoma.  Diverticulosis in the rectosigmoid colon and the ascending colon.  Torturous colon.  External and internal hemorrhoids. -Patient will follow-up with GI as recommended.      Orders placed this encounter:  Orders Placed This Encounter  Procedures  . Lactate dehydrogenase  . CBC with Differential/Platelet  . Comprehensive metabolic panel  . Vitamin B12  . Vitamin D 25 hydroxy  . Ferritin  . Iron and TIBC  . CBC with Differential/Platelet  . Comprehensive metabolic panel      Francene Finders, FNP-C [place] [phone]

## 2019-10-26 NOTE — Assessment & Plan Note (Addendum)
1.  Secondary polycythemia: - Patient reports he clotted when he would go to donate blood.  He was always hot and very thirsty.  PCP noticed his hematocrit and hemoglobin were elevated and sent him to hematology. - His blood work was evaluated he was negative for HIV, Jak 2 and BCR/ABL.  EPO level was normal at 10.  SPEP, ANA, CRP, RF were all normal. - Hemochromatosis gene evaluation showed he had a single C282Y gene and is a carrier for hemochromatosis. - He also reports he has sleep apnea and uses a CPAP machine at night.  He was referred for another sleep study evaluation.  But they canceled his appointment due to the COVID crisis. -Patient denies smoking history.  Denies any headaches or blurred vision.  Denies any skin changes. - Patient had his first 2 500 mL phlebotomies on 02/09/2019 in 02/23/2019.  He reports he is not as hot or thirsty since the phlebotomies. -His last phlebotomy was on 08/28/2019 and 09/27/2019. -Labs on 10/19/2019 showed his hemoglobin 14.9 and hematocrit 46.1. -We will maintain his hematocrit around 45 this helps all of his symptoms to remain lower. -We will put in for a lab draw and possible phlebotomy in 1 month. -We will follow-up in 3 months with labs  2.  Sleep apnea: - We have discussed with the patient that obstructive sleep apnea may cause secondary polycythemia. - Patient has been referred for a sleep study evaluation. - The appointment was canceled due to the [place] crisis we will reschedule after. -Patient reports he uses a CPAP machine at night  3.  Positive hep B serology: - Patient has positive hep B surface antibody and positive hep B core antibody. Surface antigen and IgM are negative.  Hep C and HIV are negative.  Patient is immune based on prior exposure.  4.  Elevated PSA: -Patient has a PSA of 7.5. -He is followed by advanced urology. -He had a prostate biopsy on 03/30/2017 with 12 core submitted that were benign. -Patient will continue to  follow-up with urology as directed.  5.  Health maintenance: -Patient had a colonoscopy on 02/07/2019 that showed a 5 mm polyp in the ascending colon that showed tubular adenoma.  Diverticulosis in the rectosigmoid colon and the ascending colon.  Torturous colon.  External and internal hemorrhoids. -Patient will follow-up with GI as recommended.

## 2019-10-26 NOTE — Patient Instructions (Signed)
[facility]  Discharge Instructions:  You saw Randi Lockamy, NP, today. _______________________________________________________________  Thank you for choosing [facility] to provide your oncology and hematology care.  To afford each patient quality time with our providers, please arrive at least 15 minutes before your scheduled appointment.  You need to re-schedule your appointment if you arrive 10 or more minutes late.  We strive to give you quality time with our providers, and arriving late affects you and other patients whose appointments are after yours.  Also, if you no show three or more times for appointments you may be dismissed from the clinic.  Again, thank you for choosing [facility]. Our hope is that these requests will allow you access to exceptional care and in a timely manner. _______________________________________________________________  If you have questions after your visit, please contact our office at [phone] between the hours of 8:30 a.m. and 5:00 p.m. Voicemails left after 4:30 p.m. will not be returned until the following business day. _______________________________________________________________  For prescription refill requests, have your pharmacy contact our office. _______________________________________________________________  Recommendations made by the consultant and any test results will be sent to your referring physician. _______________________________________________________________ 

## 2019-11-24 ENCOUNTER — Inpatient Hospital Stay (HOSPITAL_COMMUNITY): Payer: Medicare Other | Attending: Hematology

## 2019-11-24 ENCOUNTER — Inpatient Hospital Stay (HOSPITAL_COMMUNITY): Payer: Medicare Other

## 2019-11-24 ENCOUNTER — Encounter (HOSPITAL_COMMUNITY): Payer: Self-pay

## 2019-11-24 ENCOUNTER — Other Ambulatory Visit: Payer: Self-pay

## 2019-11-24 DIAGNOSIS — D751 Secondary polycythemia: Secondary | ICD-10-CM | POA: Insufficient documentation

## 2019-11-24 LAB — COMPREHENSIVE METABOLIC PANEL
ALT: 22 U/L (ref 0–44)
AST: 19 U/L (ref 15–41)
Albumin: 3.8 g/dL (ref 3.5–5.0)
Alkaline Phosphatase: 81 U/L (ref 38–126)
Anion gap: 10 (ref 5–15)
BUN: 21 mg/dL (ref 8–23)
CO2: 26 mmol/L (ref 22–32)
Calcium: 9.1 mg/dL (ref 8.9–10.3)
Chloride: 101 mmol/L (ref 98–111)
Creatinine, Ser: 1.54 mg/dL — ABNORMAL HIGH (ref 0.61–1.24)
GFR calc Af Amer: 53 mL/min — ABNORMAL LOW (ref >60)
GFR calc non Af Amer: 46 mL/min — ABNORMAL LOW (ref >60)
Glucose, Bld: 238 mg/dL — ABNORMAL HIGH (ref 70–99)
Potassium: 4.5 mmol/L (ref 3.5–5.1)
Sodium: 137 mmol/L (ref 135–145)
Total Bilirubin: 0.6 mg/dL (ref 0.3–1.2)
Total Protein: 7.3 g/dL (ref 6.5–8.1)

## 2019-11-24 LAB — CBC WITH DIFFERENTIAL/PLATELET
Abs Immature Granulocytes: 0.02 10*3/uL (ref 0.00–0.07)
Basophils Absolute: 0.1 10*3/uL (ref 0.0–0.1)
Basophils Relative: 1 %
Eosinophils Absolute: 0.2 10*3/uL (ref 0.0–0.5)
Eosinophils Relative: 3 %
HCT: 48 % (ref 39.0–52.0)
Hemoglobin: 15.5 g/dL (ref 13.0–17.0)
Immature Granulocytes: 0 %
Lymphocytes Relative: 26 %
Lymphs Abs: 1.3 10*3/uL (ref 0.7–4.0)
MCH: 28.9 pg (ref 26.0–34.0)
MCHC: 32.3 g/dL (ref 30.0–36.0)
MCV: 89.6 fL (ref 80.0–100.0)
Monocytes Absolute: 0.4 10*3/uL (ref 0.1–1.0)
Monocytes Relative: 8 %
Neutro Abs: 3.2 10*3/uL (ref 1.7–7.7)
Neutrophils Relative %: 62 %
Platelets: 219 10*3/uL (ref 150–400)
RBC: 5.36 MIL/uL (ref 4.22–5.81)
RDW: 13.3 % (ref 11.5–15.5)
WBC: 5.2 10*3/uL (ref 4.0–10.5)
nRBC: 0 % (ref 0.0–0.2)

## 2019-11-24 NOTE — Patient Instructions (Signed)
[facility]  Discharge Instructions:   _______________________________________________________________  Thank you for choosing [facility] to provide your oncology and hematology care.  To afford each patient quality time with our providers, please arrive at least 15 minutes before your scheduled appointment.  You need to re-schedule your appointment if you arrive 10 or more minutes late.  We strive to give you quality time with our providers, and arriving late affects you and other patients whose appointments are after yours.  Also, if you no show three or more times for appointments you may be dismissed from the clinic.  Again, thank you for choosing [facility]. Our hope is that these requests will allow you access to exceptional care and in a timely manner. _______________________________________________________________  If you have questions after your visit, please contact our office at [phone] between the hours of 8:30 a.m. and 5:00 p.m. Voicemails left after 4:30 p.m. will not be returned until the following business day. _______________________________________________________________  For prescription refill requests, have your pharmacy contact our office. _______________________________________________________________  Recommendations made by the consultant and any test results will be sent to your referring physician. _______________________________________________________________ 

## 2019-11-24 NOTE — Progress Notes (Signed)
[name] presents today for phlebotomy per MD orders. Phlebotomy procedure started at 10:19 and ended at 10:25 500 grams removed. Patient observed for 30 minutes after procedure without any incident. Patient tolerated procedure well. IV needle removed intact.   Vital signs stable. No complaints at this time. Discharged from clinic ambulatory. F/U with [facility] as scheduled.

## 2019-12-21 ENCOUNTER — Ambulatory Visit (INDEPENDENT_AMBULATORY_CARE_PROVIDER_SITE_OTHER): Payer: Medicare Other | Admitting: Family Medicine

## 2019-12-21 ENCOUNTER — Encounter: Payer: Self-pay | Admitting: Family Medicine

## 2019-12-21 DIAGNOSIS — R1032 Left lower quadrant pain: Secondary | ICD-10-CM

## 2019-12-21 DIAGNOSIS — R1031 Right lower quadrant pain: Secondary | ICD-10-CM

## 2019-12-21 MED ORDER — DOXYCYCLINE HYCLATE 100 MG PO TABS: 100.0000 mg | ORAL_TABLET | Freq: Two times a day (BID) | ORAL | 0 refills | Status: AC

## 2019-12-21 NOTE — Progress Notes (Signed)
Virtual Visit via telephone Note Due to COVID-19 pandemic this visit was conducted virtually. This visit type was conducted due to national recommendations for restrictions regarding the COVID-19 Pandemic (e.g. social distancing, sheltering in place) in an effort to limit this patient's exposure and mitigate transmission in our community. All issues noted in this document were discussed and addressed.  A physical exam was not performed with this format.   I connected with [name] on 12/21/2019 at [facility] by telephone and verified that I am speaking with the correct person using two identifiers. [name] is currently located at home and family is currently with them during visit. The provider, Monia Pouch, FNP is located in their office at time of visit.  I discussed the limitations, risks, security and privacy concerns of performing an evaluation and management service by telephone and the availability of in person appointments. I also discussed with the patient that there may be a patient responsible charge related to this service. The patient expressed understanding and agreed to proceed.  Subjective:  Patient ID: [name], male    DOB: [birth year], 69 y.o.   MRN: [id number]  Chief Complaint:  Abdominal Pain   HPI: [name] is a 69 y.o. male presenting on 12/21/2019 for Abdominal Pain   Pt reports lower abdominal pain with urinary hesitancy and urgency. States he feels like he can not empty his bladder completely. States symptoms started 3 days ago and are not getting better. He has not tried anything for the symptoms. He has had similar incidents in the past with an unknown etiology. States the provider suspected diverticulitis or prostatitis. Pt denies diarrhea or constipation, no melena or hematochezia. No nausea, vomiting, or flank pain. No dysuria or hematuria. Unknown if he has a temperature but denies chills, weakness, malaise, or confusion. No penile discharge  or scrotal pain.    Abdominal Pain This is a recurrent problem. The current episode started in the past 7 days. The onset quality is sudden. The problem occurs daily. The problem has been waxing and waning. The pain is located in the suprapubic region, LLQ and RLQ. The pain is at a severity of 5/10. The pain is moderate. The quality of the pain is aching, cramping and sharp. The abdominal pain does not radiate. Pertinent negatives include no anorexia, arthralgias, belching, constipation, diarrhea, dysuria, fever, flatus, frequency, headaches, hematochezia, hematuria, melena, myalgias, nausea, vomiting or weight loss. Nothing aggravates the pain. The pain is relieved by nothing. He has tried acetaminophen for the symptoms. The treatment provided mild relief.     Relevant past medical, surgical, family, and social history reviewed and updated as indicated.  Allergies and medications reviewed and updated.   Past Medical History:  Diagnosis Date  . Diabetes mellitus without complication ([place])   . GERD (gastroesophageal reflux disease)   . Hx of [place] spotted fever [id number]  . Hypertension   . Sleep apnea    wears cpap     Past Surgical History:  Procedure Laterality Date  . COLONOSCOPY  09/2012   Dr. Britta Mccreedy. Diverticulosis, descending adenomatous colon polyp, hyperplastic rectal polyp. ?mass in anal canal evaluated by surgery and consistent with internal hemorrhoid  . COLONOSCOPY WITH PROPOFOL N/A 02/07/2019   Procedure: COLONOSCOPY WITH PROPOFOL;  Surgeon: Danie Binder, MD;  [facility];  Service: Endoscopy;  Laterality: N/A;  9:30am  . KNEE SURGERY Left 10/2014  . KNEE SURGERY Right 09/2015  . POLYPECTOMY  02/07/2019   Procedure: POLYPECTOMY;  Surgeon: Danie Binder, MD;  Location: [facility];  Service: Endoscopy;;    Social History   Socioeconomic History  . Marital status: Single    Spouse name: Not on file  . Number of children: 0  . Years of  education: 62  . Highest education level: 12th grade  Occupational History  . 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 Smoking status: Never Smoker  . Smokeless tobacco: Never Used  Substance and Sexual Activity  . Alcohol use: No  . Drug use: No  . Sexual activity: Not Currently  Other Topics Concern  . Not on file  Social History Narrative   WORKED AT [employer] > 10 YRS. NOT MARRIED. FUR BABIES: 3 DOGS AND 5 CATS. SPENDS FREE TIME: [place], [place].   Social Determinants of Health   Financial Resource Strain:   . Difficulty of Paying Living Expenses: Not on file  Food Insecurity:   . Worried About Charity fundraiser in the Last Year: Not on file  . Ran Out of Food in the Last Year: Not on file  Transportation Needs:   . Lack of Transportation (Medical): Not on file  . Lack of Transportation (Non-Medical): Not on file  Physical Activity:   . Days of Exercise per Week: Not on file  . Minutes of Exercise per Session: Not on file  Stress:   . Feeling of Stress : Not on file  Social Connections: Unknown  . Frequency of Communication with Friends and Family: Not on file  . Frequency of Social Gatherings with Friends and Family: Three times a week  . Attends Religious Services: Not on file  . Active Member of Clubs or Organizations: Not on file  . Attends Archivist Meetings: Not on file  . Marital Status: Not on file  Intimate Partner Violence:   . Fear of Current or Ex-Partner: Not on file  . Emotionally Abused: Not on file  . Physically Abused: Not on file  . Sexually Abused: Not on file    Outpatient Encounter Medications as of 12/21/2019  Medication Sig  . aspirin EC 81 MG tablet Take 81 mg by mouth every evening.  Marland Kitchen doxycycline (VIBRA-TABS) 100 MG tablet Take 1 tablet (100 mg total) by mouth 2 (two) times daily for 10 days. 1 po bid  . gabapentin (NEURONTIN) 100 MG capsule Take 1 capsule (100 mg total) by  mouth at bedtime.  Marland Kitchen glucose blood test strip Use to check blood glucose once daily at varying times.  Dispense Verio One Touch Test strips  . irbesartan (AVAPRO) 150 MG tablet Take 1 tablet (150 mg total) by mouth daily.  . metFORMIN (GLUCOPHAGE) 1000 MG tablet Take 1 tablet (1,000 mg total) by mouth 2 (two) times daily with a meal.  . Multiple Vitamins-Minerals (CENTRUM SILVER 50+MEN PO) Take 1 tablet by mouth daily. Every now and then  . Omega-3 Fatty Acids (FISH OIL) 500 MG CAPS Take 500 mg by mouth 2 (two) times a week.   [name] DELICA LANCETS 99991111 MISC Use to check blood glucose once daily at varying times  . oxymetazoline (AFRIN) 0.05 % nasal spray Place 1 spray into both nostrils at bedtime as needed for congestion.  . Probiotic Product (PROBIOTIC PO) Take 1 capsule by mouth 3 (three) times a week.   . ranitidine (RANITIDINE 150 MAX STRENGTH) 150 MG tablet Take 150 mg by mouth 2 (two) times  daily.  . tamsulosin (FLOMAX) 0.4 MG CAPS capsule Take 0.4 mg by mouth daily.   No facility-administered encounter medications on file as of 12/21/2019.    Allergies  Allergen Reactions  . Ramipril Cough    Review of Systems  Constitutional: Negative for activity change, appetite change, chills, diaphoresis, fatigue, fever, unexpected weight change and weight loss.  HENT: Negative.   Eyes: Negative.   Respiratory: Negative for cough, chest tightness and shortness of breath.   Cardiovascular: Negative for chest pain, palpitations and leg swelling.  Gastrointestinal: Positive for abdominal pain. Negative for abdominal distention, anal bleeding, anorexia, blood in stool, constipation, diarrhea, flatus, hematochezia, melena, nausea, rectal pain and vomiting.  Endocrine: Negative.   Genitourinary: Positive for difficulty urinating and urgency. Negative for decreased urine volume, discharge, dysuria, enuresis, flank pain, frequency, hematuria, penile pain, penile swelling, scrotal swelling and  testicular pain.  Musculoskeletal: Negative for arthralgias and myalgias.  Skin: Negative.   Allergic/Immunologic: Negative.   Neurological: Negative for dizziness, weakness and headaches.  Hematological: Negative.   Psychiatric/Behavioral: Negative for confusion, hallucinations, sleep disturbance and suicidal ideas.  All other systems reviewed and are negative.        Observations/Objective: No vital signs or physical exam, this was a telephone or virtual health encounter.  Pt alert and oriented, answers all questions appropriately, and able to speak in full sentences.    Assessment and Plan: [name] was seen today for abdominal pain.  Diagnoses and all orders for this visit:  Bilateral lower abdominal pain Lower abdominal pain with LUTS. Concerning for prostatitis. Will initiate below. No red flags concerning for sepsis. No symptoms concerning for diverticulitis. Pt aware of symptoms that require emergent evaluation and treatment.  -     doxycycline (VIBRA-TABS) 100 MG tablet; Take 1 tablet (100 mg total) by mouth 2 (two) times daily for 10 days. 1 po bid     Follow Up Instructions: Return if symptoms worsen or fail to improve.    I discussed the assessment and treatment plan with the patient. The patient was provided an opportunity to ask questions and all were answered. The patient agreed with the plan and demonstrated an understanding of the instructions.   The patient was advised to call back or seek an in-person evaluation if the symptoms worsen or if the condition fails to improve as anticipated.  The above assessment and management plan was discussed with the patient. The patient verbalized understanding of and has agreed to the management plan. Patient is aware to call the clinic if they develop any new symptoms or if symptoms persist or worsen. Patient is aware when to return to the clinic for a follow-up visit. Patient educated on when it is appropriate to go to the  emergency department.    I provided 15 minutes of non-face-to-face time during this encounter. The call started at Timothy [place]. The call ended at 0900. The other time was used for coordination of care.    Monia Pouch, FNP-C [facility] [address] [place], [place] [address] [phone] 12/21/2019

## 2020-01-01 ENCOUNTER — Other Ambulatory Visit (HOSPITAL_COMMUNITY): Payer: Self-pay | Admitting: Nurse Practitioner

## 2020-01-01 DIAGNOSIS — D751 Secondary polycythemia: Secondary | ICD-10-CM

## 2020-01-03 ENCOUNTER — Inpatient Hospital Stay (HOSPITAL_COMMUNITY): Payer: Medicare Other | Attending: Hematology

## 2020-01-03 ENCOUNTER — Encounter (HOSPITAL_COMMUNITY): Payer: Self-pay

## 2020-01-03 ENCOUNTER — Other Ambulatory Visit: Payer: Self-pay

## 2020-01-03 DIAGNOSIS — D751 Secondary polycythemia: Secondary | ICD-10-CM | POA: Diagnosis not present

## 2020-01-03 NOTE — Progress Notes (Signed)
Note reviewed and labs for phlebotomy today.   [name] presents today for phlebotomy per MD orders. Phlebotomy procedure started at 1245 and ended at 1252. 500 cc removed. Patient tolerated procedure well. IV needle removed intact.  Patient tolerated procedure with no complaints voiced.  Right peripheral IV site clean and dry with no bruising or swelling noted.  Band aid applied.  Patient left ambulatory with no s/s of distress noted.

## 2020-01-10 ENCOUNTER — Ambulatory Visit (INDEPENDENT_AMBULATORY_CARE_PROVIDER_SITE_OTHER): Payer: Medicare Other | Admitting: Family Medicine

## 2020-01-10 ENCOUNTER — Encounter: Payer: Self-pay | Admitting: Family Medicine

## 2020-01-10 DIAGNOSIS — I10 Essential (primary) hypertension: Secondary | ICD-10-CM

## 2020-01-10 DIAGNOSIS — G4733 Obstructive sleep apnea (adult) (pediatric): Secondary | ICD-10-CM | POA: Diagnosis not present

## 2020-01-10 DIAGNOSIS — E669 Obesity, unspecified: Secondary | ICD-10-CM

## 2020-01-10 DIAGNOSIS — Z9989 Dependence on other enabling machines and devices: Secondary | ICD-10-CM | POA: Diagnosis not present

## 2020-01-10 DIAGNOSIS — E1159 Type 2 diabetes mellitus with other circulatory complications: Secondary | ICD-10-CM | POA: Diagnosis not present

## 2020-01-10 DIAGNOSIS — E1169 Type 2 diabetes mellitus with other specified complication: Secondary | ICD-10-CM

## 2020-01-10 MED ORDER — IRBESARTAN 150 MG PO TABS: 150.0000 mg | ORAL_TABLET | Freq: Every day | ORAL | 3 refills | Status: DC

## 2020-01-10 MED ORDER — GABAPENTIN 100 MG PO CAPS: 100.0000 mg | ORAL_CAPSULE | Freq: Every day | ORAL | 1 refills | Status: DC

## 2020-01-10 NOTE — Progress Notes (Signed)
Virtual Visit via telephone Note  I connected with [name] on 01/10/20 at 0808 by telephone and verified that I am speaking with the correct person using two identifiers. [name] is currently located at home and no other people are currently with her during visit. The provider, Fransisca Kaufmann Goldye Tourangeau, MD is located in their office at time of visit.  Call ended at 0829  I discussed the limitations, risks, security and privacy concerns of performing an evaluation and management service by telephone and the availability of in person appointments. I also discussed with the patient that there may be a patient responsible charge related to this service. The patient expressed understanding and agreed to proceed.   History and Present Illness: Type 2 diabetes mellitus Patient comes in today for recheck of his diabetes. Patient has been currently taking metformin and bs 100-120 in am most of the time. Patient is currently on an ACE inhibitor/ARB. Patient has not seen an ophthalmologist this year. Patient denies any issues with their feet. Patient takes gabapentin  Hypertension Patient is currently on irbesartan, and their blood pressure today is 142/80. Patient denies any lightheadedness or dizziness. Patient denies headaches, blurred vision, chest pains, shortness of breath, or weakness. Denies any side effects from medication and is content with current medication.   Sleep apnea Patient has sleep apnea and uses the machine and gabapentin and still has somewhat difficulty with being too hot at night and not sleeping well sometimes because of that.  He will try to get another soon as soon covid allows.   No diagnosis found.  Outpatient Encounter Medications as of 01/10/2020  Medication Sig  . aspirin EC 81 MG tablet Take 81 mg by mouth every evening.  . gabapentin (NEURONTIN) 100 MG capsule Take 1 capsule (100 mg total) by mouth at bedtime.  Marland Kitchen glucose blood test strip Use to check  blood glucose once daily at varying times.  Dispense Verio One Touch Test strips  . irbesartan (AVAPRO) 150 MG tablet Take 1 tablet (150 mg total) by mouth daily.  . metFORMIN (GLUCOPHAGE) 1000 MG tablet Take 1 tablet (1,000 mg total) by mouth 2 (two) times daily with a meal.  . Multiple Vitamins-Minerals (CENTRUM SILVER 50+MEN PO) Take 1 tablet by mouth daily. Every now and then  . Omega-3 Fatty Acids (FISH OIL) 500 MG CAPS Take 500 mg by mouth 2 (two) times a week.   [name] DELICA LANCETS 40J MISC Use to check blood glucose once daily at varying times  . oxymetazoline (AFRIN) 0.05 % nasal spray Place 1 spray into both nostrils at bedtime as needed for congestion.  . Probiotic Product (PROBIOTIC PO) Take 1 capsule by mouth 3 (three) times a week.   . ranitidine (RANITIDINE 150 MAX STRENGTH) 150 MG tablet Take 150 mg by mouth 2 (two) times daily.  . tamsulosin (FLOMAX) 0.4 MG CAPS capsule Take 0.4 mg by mouth daily.   No facility-administered encounter medications on file as of 01/10/2020.    Review of Systems  Constitutional: Negative for chills and fever.  Eyes: Negative for visual disturbance.  Respiratory: Negative for shortness of breath and wheezing.   Cardiovascular: Negative for chest pain and leg swelling.  Musculoskeletal: Negative for back pain and gait problem.  Skin: Negative for rash.  Neurological: Negative for dizziness, weakness and light-headedness.  All other systems reviewed and are negative.   Observations/Objective: Patient sounds comfortable and in no acute distress  Assessment and Plan: Problem List Items Addressed  This Visit      Cardiovascular and Mediastinum   Hypertension associated with diabetes ([place]) - Primary   Relevant Medications   irbesartan (AVAPRO) 150 MG tablet   Other Relevant Orders   CMP14+EGFR   Lipid panel     Respiratory   OSA on CPAP   Relevant Orders   CBC with Differential/Platelet     Endocrine   Diabetes mellitus type 2  in obese ([facility])   Relevant Medications   irbesartan (AVAPRO) 150 MG tablet   Other Relevant Orders   Bayer DCA Hb A1c Waived   Lipid panel      Continue current medications and check blood work Follow up plan: Return in about 3 months (around 04/09/2020), or if symptoms worsen or fail to improve, for dm and htn.     I discussed the assessment and treatment plan with the patient. The patient was provided an opportunity to ask questions and all were answered. The patient agreed with the plan and demonstrated an understanding of the instructions.   The patient was advised to call back or seek an in-person evaluation if the symptoms worsen or if the condition fails to improve as anticipated.  The above assessment and management plan was discussed with the patient. The patient verbalized understanding of and has agreed to the management plan. Patient is aware to call the clinic if symptoms persist or worsen. Patient is aware when to return to the clinic for a follow-up visit. Patient educated on when it is appropriate to go to the emergency department.    I provided 21 minutes of non-face-to-face time during this encounter.    Worthy Rancher, MD

## 2020-01-18 ENCOUNTER — Encounter: Payer: Self-pay | Admitting: Family Medicine

## 2020-01-22 MED ORDER — GABAPENTIN 100 MG PO CAPS: 200.0000 mg | ORAL_CAPSULE | Freq: Every day | ORAL | 1 refills | Status: DC

## 2020-01-25 ENCOUNTER — Inpatient Hospital Stay (HOSPITAL_BASED_OUTPATIENT_CLINIC_OR_DEPARTMENT_OTHER): Payer: Medicare Other | Admitting: Nurse Practitioner

## 2020-01-25 ENCOUNTER — Inpatient Hospital Stay (HOSPITAL_COMMUNITY): Payer: Medicare Other | Attending: Hematology

## 2020-01-25 ENCOUNTER — Other Ambulatory Visit (HOSPITAL_COMMUNITY)
Admission: RE | Admit: 2020-01-25 | Discharge: 2020-01-25 | Disposition: A | Discharge status: Home / Self Care | Payer: Medicare Other | Source: Ambulatory Visit | Attending: Family Medicine | Admitting: Family Medicine

## 2020-01-25 ENCOUNTER — Encounter (HOSPITAL_COMMUNITY): Payer: Self-pay | Admitting: Nurse Practitioner

## 2020-01-25 ENCOUNTER — Other Ambulatory Visit: Payer: Self-pay

## 2020-01-25 DIAGNOSIS — Z7982 Long term (current) use of aspirin: Secondary | ICD-10-CM | POA: Diagnosis not present

## 2020-01-25 DIAGNOSIS — D751 Secondary polycythemia: Secondary | ICD-10-CM | POA: Diagnosis not present

## 2020-01-25 DIAGNOSIS — Z9989 Dependence on other enabling machines and devices: Secondary | ICD-10-CM | POA: Insufficient documentation

## 2020-01-25 DIAGNOSIS — K219 Gastro-esophageal reflux disease without esophagitis: Secondary | ICD-10-CM | POA: Diagnosis not present

## 2020-01-25 DIAGNOSIS — I10 Essential (primary) hypertension: Secondary | ICD-10-CM | POA: Insufficient documentation

## 2020-01-25 DIAGNOSIS — E119 Type 2 diabetes mellitus without complications: Secondary | ICD-10-CM | POA: Insufficient documentation

## 2020-01-25 DIAGNOSIS — G4733 Obstructive sleep apnea (adult) (pediatric): Secondary | ICD-10-CM | POA: Diagnosis not present

## 2020-01-25 DIAGNOSIS — Z79899 Other long term (current) drug therapy: Secondary | ICD-10-CM | POA: Insufficient documentation

## 2020-01-25 DIAGNOSIS — Z7984 Long term (current) use of oral hypoglycemic drugs: Secondary | ICD-10-CM | POA: Insufficient documentation

## 2020-01-25 DIAGNOSIS — R972 Elevated prostate specific antigen [PSA]: Secondary | ICD-10-CM | POA: Diagnosis not present

## 2020-01-25 DIAGNOSIS — E1159 Type 2 diabetes mellitus with other circulatory complications: Secondary | ICD-10-CM | POA: Diagnosis not present

## 2020-01-25 DIAGNOSIS — R519 Headache, unspecified: Secondary | ICD-10-CM | POA: Insufficient documentation

## 2020-01-25 DIAGNOSIS — E669 Obesity, unspecified: Secondary | ICD-10-CM | POA: Diagnosis not present

## 2020-01-25 DIAGNOSIS — E1169 Type 2 diabetes mellitus with other specified complication: Secondary | ICD-10-CM | POA: Insufficient documentation

## 2020-01-25 DIAGNOSIS — G4730 Sleep apnea, unspecified: Secondary | ICD-10-CM | POA: Diagnosis not present

## 2020-01-25 LAB — COMPREHENSIVE METABOLIC PANEL
ALT: 24 U/L (ref 0–44)
AST: 23 U/L (ref 15–41)
Albumin: 3.9 g/dL (ref 3.5–5.0)
Alkaline Phosphatase: 65 U/L (ref 38–126)
Anion gap: 6 (ref 5–15)
BUN: 20 mg/dL (ref 8–23)
CO2: 24 mmol/L (ref 22–32)
Calcium: 8.8 mg/dL — ABNORMAL LOW (ref 8.9–10.3)
Chloride: 102 mmol/L (ref 98–111)
Creatinine, Ser: 1.14 mg/dL (ref 0.61–1.24)
GFR calc Af Amer: >60 mL/min (ref >60)
GFR calc non Af Amer: >60 mL/min (ref >60)
Glucose, Bld: 231 mg/dL — ABNORMAL HIGH (ref 70–99)
Potassium: 4.2 mmol/L (ref 3.5–5.1)
Sodium: 132 mmol/L — ABNORMAL LOW (ref 135–145)
Total Bilirubin: 0.9 mg/dL (ref 0.3–1.2)
Total Protein: 7.4 g/dL (ref 6.5–8.1)

## 2020-01-25 LAB — IRON AND TIBC
Iron: 67 ug/dL (ref 45–182)
Saturation Ratios: 14 % — ABNORMAL LOW (ref 17.9–39.5)
TIBC: 462 ug/dL — ABNORMAL HIGH (ref 250–450)
UIBC: 395 ug/dL

## 2020-01-25 LAB — CBC WITH DIFFERENTIAL/PLATELET
Abs Immature Granulocytes: 0.01 10*3/uL (ref 0.00–0.07)
Basophils Absolute: 0.1 10*3/uL (ref 0.0–0.1)
Basophils Relative: 1 %
Eosinophils Absolute: 0.2 10*3/uL (ref 0.0–0.5)
Eosinophils Relative: 3 %
HCT: 44.7 % (ref 39.0–52.0)
Hemoglobin: 14.7 g/dL (ref 13.0–17.0)
Immature Granulocytes: 0 %
Lymphocytes Relative: 26 %
Lymphs Abs: 1.3 10*3/uL (ref 0.7–4.0)
MCH: 28.5 pg (ref 26.0–34.0)
MCHC: 32.9 g/dL (ref 30.0–36.0)
MCV: 86.8 fL (ref 80.0–100.0)
Monocytes Absolute: 0.4 10*3/uL (ref 0.1–1.0)
Monocytes Relative: 8 %
Neutro Abs: 3.1 10*3/uL (ref 1.7–7.7)
Neutrophils Relative %: 62 %
Platelets: 246 10*3/uL (ref 150–400)
RBC: 5.15 MIL/uL (ref 4.22–5.81)
RDW: 13.4 % (ref 11.5–15.5)
WBC: 5.1 10*3/uL (ref 4.0–10.5)
nRBC: 0 % (ref 0.0–0.2)

## 2020-01-25 LAB — FERRITIN: Ferritin: 10 ng/mL — ABNORMAL LOW (ref 24–336)

## 2020-01-25 LAB — HEMOGLOBIN A1C
Hgb A1c MFr Bld: 9.4 % — ABNORMAL HIGH (ref 4.8–5.6)
Mean Plasma Glucose: 223.08 mg/dL

## 2020-01-25 LAB — LIPID PANEL
Cholesterol: 146 mg/dL (ref 0–200)
HDL: 34 mg/dL — ABNORMAL LOW (ref >40)
LDL Cholesterol: 89 mg/dL (ref 0–99)
Total CHOL/HDL Ratio: 4.3 RATIO
Triglycerides: 113 mg/dL (ref <150)
VLDL: 23 mg/dL (ref 0–40)

## 2020-01-25 LAB — VITAMIN D 25 HYDROXY (VIT D DEFICIENCY, FRACTURES): Vit D, 25-Hydroxy: 28.53 ng/mL — ABNORMAL LOW (ref 30–100)

## 2020-01-25 LAB — LACTATE DEHYDROGENASE: LDH: 134 U/L (ref 98–192)

## 2020-01-25 LAB — VITAMIN B12: Vitamin B-12: 306 pg/mL (ref 180–914)

## 2020-01-25 NOTE — Assessment & Plan Note (Signed)
1.  Secondary polycythemia: - Patient reports he clotted when he would go to donate blood.  He was always hot and very thirsty.  PCP noticed his hematocrit and hemoglobin were elevated and sent him to hematology. - His blood work was evaluated he was negative for HIV, Jak 2 and BCR/ABL.  EPO level was normal at 10.  SPEP, ANA, CRP, RF were all normal. - Hemochromatosis gene evaluation showed he had a single C282Y gene and is a carrier for hemochromatosis. - He also reports he has sleep apnea and uses a CPAP machine at night.  He was referred for another sleep study evaluation.  But they canceled his appointment due to the COVID crisis. -Patient denies smoking history.  Denies any headaches or blurred vision.  Denies any skin changes. - Patient had his first 2 500 mL phlebotomies on 02/09/2019 in 02/23/2019.  He reports he is not as hot or thirsty since the phlebotomies. -His last phlebotomy was on 01/03/2020.  He has been averaging a phlebotomy every 6 weeks. -Labs on 01/25/2020 showed his hemoglobin 14.7 and hematocrit 44.7. -We will maintain his hematocrit around 45 this helps all of his symptoms to remain lower. -His next lab draw will be the first week in March.  We will continue this every 6 weeks. -We will follow-up at the end of May with labs.  2.  Sleep apnea: - We have discussed with the patient that obstructive sleep apnea may cause secondary polycythemia. - Patient has been referred for a sleep study evaluation. - The appointment was canceled due to the [place] crisis we will reschedule after. -Patient reports he uses a CPAP machine at night  3.  Positive hep B serology: - Patient has positive hep B surface antibody and positive hep B core antibody. Surface antigen and IgM are negative.  Hep C and HIV are negative.  Patient is immune based on prior exposure.  4.  Elevated PSA: -Patient has a PSA of 9.2 -He is followed by advanced urology. -He had a prostate biopsy on 03/30/2017 with 12  core submitted that were benign. -Patient will continue to follow-up with urology as directed.  5.  Health maintenance: -Patient had a colonoscopy on 02/07/2019 that showed a 5 mm polyp in the ascending colon that showed tubular adenoma.  Diverticulosis in the rectosigmoid colon and the ascending colon.  Torturous colon.  External and internal hemorrhoids. -Patient will follow-up with GI as recommended.

## 2020-01-25 NOTE — Patient Instructions (Addendum)
[place] at [facility]  Discharge Instructions: Follow up every 6 weeks for labs and phlebotomies  You saw Francene Finders, NP, today. _______________________________________________________________  Thank you for choosing [place] at [facility] to provide your oncology and hematology care.  To afford each patient quality time with our providers, please arrive at least 15 minutes before your scheduled appointment.  You need to re-schedule your appointment if you arrive 10 or more minutes late.  We strive to give you quality time with our providers, and arriving late affects you and other patients whose appointments are after yours.  Also, if you no show three or more times for appointments you may be dismissed from the clinic.  Again, thank you for choosing [place] at [facility] hope is that these requests will allow you access to exceptional care and in a timely manner. _______________________________________________________________  If you have questions after your visit, please contact our office at [phone] between the hours of 8:30 a.m. and 5:00 p.m. Voicemails left after 4:30 p.m. will not be returned until the following business day. _______________________________________________________________  For prescription refill requests, have your pharmacy contact our office. _______________________________________________________________  Recommendations made by the consultant and any test results will be sent to your referring physician. _______________________________________________________________

## 2020-01-25 NOTE — Progress Notes (Signed)
[place], [place] [id number]   CLINIC:  Medical Oncology/Hematology  PCP:  Dettinger, Fransisca Kaufmann, [place] [id number] [phone]   REASON FOR VISIT: Follow-up for secondary polycythemia.  CURRENT THERAPY: Intermittent phlebotomies   INTERVAL HISTORY:  [name] 69 y.o. male returns for routine follow-up for polycythemia.  Patient reports he is doing well with every 6-week phlebotomies.  He reports he feels less sweaty and hot from day-to-day.  He reports his headaches are better. Denies any nausea, vomiting, or diarrhea. Denies any new pains. Had not noticed any recent bleeding such as epistaxis, hematuria or hematochezia. Denies recent chest pain on exertion, shortness of breath on minimal exertion, pre-syncopal episodes, or palpitations. Denies any numbness or tingling in hands or feet. Denies any recent fevers, infections, or recent hospitalizations. Patient reports appetite at 100% and energy level at 100%.  He is eating well maintain his weight at this time.    REVIEW OF SYSTEMS:  Review of Systems  All other systems reviewed and are negative.    PAST MEDICAL/SURGICAL HISTORY:  Past Medical History:  Diagnosis Date  . Diabetes mellitus without complication ([place])   . GERD (gastroesophageal reflux disease)   . Hx of [place] spotted fever 1-1-12011  . Hypertension   . Sleep apnea    wears cpap    Past Surgical History:  Procedure Laterality Date  . COLONOSCOPY  09/2012   Dr. Britta Mccreedy. Diverticulosis, descending adenomatous colon polyp, hyperplastic rectal polyp. ?mass in anal canal evaluated by surgery and consistent with internal hemorrhoid  . COLONOSCOPY WITH PROPOFOL N/A 02/07/2019   Procedure: COLONOSCOPY WITH PROPOFOL;  Surgeon: Danie Binder, MD;  Location: [facility];  Service: Endoscopy;  Laterality: N/A;  9:30am  . KNEE SURGERY Left 10/2014  . KNEE SURGERY Right 09/2015  . POLYPECTOMY   02/07/2019   Procedure: POLYPECTOMY;  Surgeon: Danie Binder, MD;  Location: [facility];  Service: Endoscopy;;     SOCIAL HISTORY:  Social History   Socioeconomic History  . Marital status: Single    Spouse name: Not on file  . Number of children: 0  . Years of education: 33  . Highest education level: 12th grade  Occupational History  . 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 Smoking status: Never Smoker  . Smokeless tobacco: Never Used  Substance and Sexual Activity  . Alcohol use: No  . Drug use: No  . Sexual activity: Not Currently  Other Topics Concern  . Not on file  Social History Narrative   WORKED AT [employer] > 10 YRS. NOT MARRIED. FUR BABIES: 3 DOGS AND 5 CATS. SPENDS FREE TIME: [place], [place].   Social Determinants of Health   Financial Resource Strain:   . Difficulty of Paying Living Expenses: Not on file  Food Insecurity:   . Worried About Charity fundraiser in the Last Year: Not on file  . Ran Out of Food in the Last Year: Not on file  Transportation Needs:   . Lack of Transportation (Medical): Not on file  . Lack of Transportation (Non-Medical): Not on file  Physical Activity:   . Days of Exercise per Week: Not on file  . Minutes of Exercise per Session: Not on file  Stress:   . Feeling of Stress : Not on file  Social Connections: Unknown  . Frequency of Communication with  Friends and Family: Not on file  . Frequency of Social Gatherings with Friends and Family: Three times a week  . Attends Religious Services: Not on file  . Active Member of Clubs or Organizations: Not on file  . Attends Archivist Meetings: Not on file  . Marital Status: Not on file  Intimate Partner Violence:   . Fear of Current or Ex-Partner: Not on file  . Emotionally Abused: Not on file  . Physically Abused: Not on file  . Sexually Abused: Not on file    FAMILY HISTORY:  Family History  Problem Relation  Age of Onset  . Heart disease Mother   . Congestive Heart Failure Mother   . Diabetes Father   . Aneurysm Sister   . Diabetes Paternal Uncle   . Diabetes Paternal Grandmother   . Colon cancer Neg Hx   . Colon polyps Neg Hx     CURRENT MEDICATIONS:  Outpatient Encounter Medications as of 01/25/2020  Medication Sig  . aspirin EC 81 MG tablet Take 81 mg by mouth every evening.  . gabapentin (NEURONTIN) 100 MG capsule Take 2 capsules (200 mg total) by mouth at bedtime.  Marland Kitchen glucose blood test strip Use to check blood glucose once daily at varying times.  Dispense Verio One Touch Test strips  . irbesartan (AVAPRO) 150 MG tablet Take 1 tablet (150 mg total) by mouth daily.  . metFORMIN (GLUCOPHAGE) 1000 MG tablet Take 1 tablet (1,000 mg total) by mouth 2 (two) times daily with a meal.  . Multiple Vitamins-Minerals (CENTRUM SILVER 50+MEN PO) Take 1 tablet by mouth daily. Every now and then  . Omega-3 Fatty Acids (FISH OIL) 500 MG CAPS Take 500 mg by mouth 2 (two) times a week.   [name] DELICA LANCETS [id number] MISC Use to check blood glucose once daily at varying times  . oxymetazoline (AFRIN) 0.05 % nasal spray Place 1 spray into both nostrils at bedtime as needed for congestion.  . Probiotic Product (PROBIOTIC PO) Take 1 capsule by mouth 3 (three) times a week.   . ranitidine (RANITIDINE 150 MAX STRENGTH) 150 MG tablet Take 150 mg by mouth 2 (two) times daily.  . tamsulosin (FLOMAX) 0.4 MG CAPS capsule Take 0.4 mg by mouth daily.   No facility-administered encounter medications on file as of 01/25/2020.    ALLERGIES:  Allergies  Allergen Reactions  . Ramipril Cough     PHYSICAL EXAM:  ECOG Performance status: 1  Vitals:   01/25/20 0911  BP: 139/86  Pulse: 60  Resp: 18  Temp: (!) 97.1 F (36.2 C)  SpO2: 97%   Filed Weights   01/25/20 0911  Weight: 270 lb 6.4 oz (122.7 kg)    Physical Exam Constitutional:      Appearance: Normal appearance. He is normal weight.    Cardiovascular:     Rate and Rhythm: Normal rate and regular rhythm.     Heart sounds: Normal heart sounds.  Pulmonary:     Effort: Pulmonary effort is normal.     Breath sounds: Normal breath sounds.  Abdominal:     General: Bowel sounds are normal.     Palpations: Abdomen is soft.  Musculoskeletal:        General: Normal range of motion.  Skin:    General: Skin is warm.  Neurological:     Mental Status: He is alert and oriented to person, place, and time. Mental status is at baseline.  Psychiatric:  Mood and Affect: Mood normal.        Behavior: Behavior normal.        Thought Content: Thought content normal.        Judgment: Judgment normal.      LABORATORY DATA:  I have reviewed the labs as listed.  CBC    Component Value Date/Time   WBC 5.1 01/25/2020 0848   RBC 5.15 01/25/2020 0848   HGB 14.7 01/25/2020 0848   HGB 18.5 (H) 12/23/2018 0804   HCT 44.7 01/25/2020 0848   HCT 53.3 (H) 12/23/2018 0804   PLT 246 01/25/2020 0848   PLT 246 12/23/2018 0804   MCV 86.8 01/25/2020 0848   MCV 95 12/23/2018 0804   MCH 28.5 01/25/2020 0848   MCHC 32.9 01/25/2020 0848   RDW 13.4 01/25/2020 0848   RDW 12.6 12/23/2018 0804   LYMPHSABS 1.3 01/25/2020 0848   LYMPHSABS 1.4 12/23/2018 0804   MONOABS 0.4 01/25/2020 0848   EOSABS 0.2 01/25/2020 0848   EOSABS 0.1 12/23/2018 0804   BASOSABS 0.1 01/25/2020 0848   BASOSABS 0.1 12/23/2018 0804   CMP Latest Ref Rng & Units 01/25/2020 11/24/2019 10/19/2019  Glucose 70 - 99 mg/dL 231(H) 238(H) 193(H)  BUN 8 - 23 mg/dL 20 21 18   Creatinine 0.61 - 1.24 mg/dL 1.14 1.54(H) 1.15  Sodium 135 - 145 mmol/L 132(L) 137 138  Potassium 3.5 - 5.1 mmol/L 4.2 4.5 4.2  Chloride 98 - 111 mmol/L 102 101 104  CO2 22 - 32 mmol/L 24 26 25   Calcium 8.9 - 10.3 mg/dL 8.8(L) 9.1 9.3  Total Protein 6.5 - 8.1 g/dL 7.4 7.3 7.2  Total Bilirubin 0.3 - 1.2 mg/dL 0.9 0.6 0.6  Alkaline Phos 38 - 126 U/L 65 81 74  AST 15 - 41 U/L 23 19 20   ALT 0 - 44 U/L 24  22 20     I personally performed a face-to-face visit.   All questions were answered to patient's stated satisfaction. Encouraged patient to call with any new concerns or questions before his next visit to the cancer center and we can certain see him sooner, if needed.     ASSESSMENT & PLAN:   Polycythemia, secondary 1.  Secondary polycythemia: - Patient reports he clotted when he would go to donate blood.  He was always hot and very thirsty.  PCP noticed his hematocrit and hemoglobin were elevated and sent him to hematology. - His blood work was evaluated he was negative for HIV, Jak 2 and BCR/ABL.  EPO level was normal at 10.  SPEP, ANA, CRP, RF were all normal. - Hemochromatosis gene evaluation showed he had a single C282Y gene and is a carrier for hemochromatosis. - He also reports he has sleep apnea and uses a CPAP machine at night.  He was referred for another sleep study evaluation.  But they canceled his appointment due to the COVID crisis. -Patient denies smoking history.  Denies any headaches or blurred vision.  Denies any skin changes. - Patient had his first 2 500 mL phlebotomies on 02/09/2019 in 02/23/2019.  He reports he is not as hot or thirsty since the phlebotomies. -His last phlebotomy was on 01/03/2020.  He has been averaging a phlebotomy every 6 weeks. -Labs on 01/25/2020 showed his hemoglobin 14.7 and hematocrit 44.7. -We will maintain his hematocrit around 45 this helps all of his symptoms to remain lower. -His next lab draw will be the first week in March.  We will continue this every 6 weeks. -  We will follow-up at the end of May with labs.  2.  Sleep apnea: - We have discussed with the patient that obstructive sleep apnea may cause secondary polycythemia. - Patient has been referred for a sleep study evaluation. - The appointment was canceled due to the [place] crisis we will reschedule after. -Patient reports he uses a CPAP machine at night  3.  Positive hep B  serology: - Patient has positive hep B surface antibody and positive hep B core antibody. Surface antigen and IgM are negative.  Hep C and HIV are negative.  Patient is immune based on prior exposure.  4.  Elevated PSA: -Patient has a PSA of 9.2 -He is followed by advanced urology. -He had a prostate biopsy on 03/30/2017 with 12 core submitted that were benign. -Patient will continue to follow-up with urology as directed.  5.  Health maintenance: -Patient had a colonoscopy on 02/07/2019 that showed a 5 mm polyp in the ascending colon that showed tubular adenoma.  Diverticulosis in the rectosigmoid colon and the ascending colon.  Torturous colon.  External and internal hemorrhoids. -Patient will follow-up with GI as recommended.      Orders placed this encounter:  Orders Placed This Encounter  Procedures  . Lactate dehydrogenase  . CBC with Differential/Platelet  . Comprehensive metabolic panel  . Ferritin  . Iron and TIBC  . Vitamin B12  . VITAMIN D 25 Hydroxy (Vit-D Deficiency, Fractures)  . CBC with Differential/Platelet  . Comprehensive metabolic panel  . CBC with Differential/Platelet  . Comprehensive metabolic panel      Francene Finders, FNP-C [place] [phone]

## 2020-02-15 ENCOUNTER — Inpatient Hospital Stay (HOSPITAL_COMMUNITY): Payer: Medicare Other | Attending: Hematology

## 2020-02-15 ENCOUNTER — Other Ambulatory Visit: Payer: Self-pay

## 2020-02-15 ENCOUNTER — Inpatient Hospital Stay (HOSPITAL_COMMUNITY): Payer: Medicare Other

## 2020-02-15 DIAGNOSIS — Z79899 Other long term (current) drug therapy: Secondary | ICD-10-CM | POA: Diagnosis not present

## 2020-02-15 DIAGNOSIS — D751 Secondary polycythemia: Secondary | ICD-10-CM | POA: Insufficient documentation

## 2020-02-15 LAB — COMPREHENSIVE METABOLIC PANEL
ALT: 23 U/L (ref 0–44)
AST: 24 U/L (ref 15–41)
Albumin: 4 g/dL (ref 3.5–5.0)
Alkaline Phosphatase: 84 U/L (ref 38–126)
Anion gap: 9 (ref 5–15)
BUN: 24 mg/dL — ABNORMAL HIGH (ref 8–23)
CO2: 23 mmol/L (ref 22–32)
Calcium: 9.2 mg/dL (ref 8.9–10.3)
Chloride: 104 mmol/L (ref 98–111)
Creatinine, Ser: 1.54 mg/dL — ABNORMAL HIGH (ref 0.61–1.24)
GFR calc Af Amer: 53 mL/min — ABNORMAL LOW (ref >60)
GFR calc non Af Amer: 46 mL/min — ABNORMAL LOW (ref >60)
Glucose, Bld: 200 mg/dL — ABNORMAL HIGH (ref 70–99)
Potassium: 4.6 mmol/L (ref 3.5–5.1)
Sodium: 136 mmol/L (ref 135–145)
Total Bilirubin: 0.7 mg/dL (ref 0.3–1.2)
Total Protein: 7.6 g/dL (ref 6.5–8.1)

## 2020-02-15 LAB — CBC WITH DIFFERENTIAL/PLATELET
Abs Immature Granulocytes: 0.03 10*3/uL (ref 0.00–0.07)
Basophils Absolute: 0.1 10*3/uL (ref 0.0–0.1)
Basophils Relative: 1 %
Eosinophils Absolute: 0.2 10*3/uL (ref 0.0–0.5)
Eosinophils Relative: 3 %
HCT: 45.7 % (ref 39.0–52.0)
Hemoglobin: 15.1 g/dL (ref 13.0–17.0)
Immature Granulocytes: 0 %
Lymphocytes Relative: 25 %
Lymphs Abs: 1.8 10*3/uL (ref 0.7–4.0)
MCH: 28.5 pg (ref 26.0–34.0)
MCHC: 33 g/dL (ref 30.0–36.0)
MCV: 86.4 fL (ref 80.0–100.0)
Monocytes Absolute: 0.6 10*3/uL (ref 0.1–1.0)
Monocytes Relative: 8 %
Neutro Abs: 4.5 10*3/uL (ref 1.7–7.7)
Neutrophils Relative %: 63 %
Platelets: 297 10*3/uL (ref 150–400)
RBC: 5.29 MIL/uL (ref 4.22–5.81)
RDW: 13.6 % (ref 11.5–15.5)
WBC: 7.2 10*3/uL (ref 4.0–10.5)
nRBC: 0 % (ref 0.0–0.2)

## 2020-02-15 NOTE — Progress Notes (Signed)
[name] presents today for phlebotomy per MD orders. Phlebotomy procedure started at 1417 and ended at 1421 500 grams removed. Patient observed for 5 minutes after procedure without any incident. Patient tolerated procedure well. IV needle removed intact.   Vitals stable and discharged home from clinic ambulatory. Follow up as scheduled.

## 2020-02-21 ENCOUNTER — Other Ambulatory Visit: Payer: Self-pay | Admitting: Family Medicine

## 2020-03-28 ENCOUNTER — Inpatient Hospital Stay (HOSPITAL_COMMUNITY): Payer: Medicare Other | Attending: Hematology

## 2020-03-28 ENCOUNTER — Other Ambulatory Visit: Payer: Self-pay

## 2020-03-28 ENCOUNTER — Encounter (HOSPITAL_COMMUNITY): Payer: Self-pay

## 2020-03-28 ENCOUNTER — Inpatient Hospital Stay (HOSPITAL_COMMUNITY): Payer: Medicare Other

## 2020-03-28 DIAGNOSIS — D751 Secondary polycythemia: Secondary | ICD-10-CM | POA: Insufficient documentation

## 2020-03-28 LAB — COMPREHENSIVE METABOLIC PANEL
ALT: 23 U/L (ref 0–44)
AST: 22 U/L (ref 15–41)
Albumin: 3.8 g/dL (ref 3.5–5.0)
Alkaline Phosphatase: 79 U/L (ref 38–126)
Anion gap: 11 (ref 5–15)
BUN: 21 mg/dL (ref 8–23)
CO2: 23 mmol/L (ref 22–32)
Calcium: 9 mg/dL (ref 8.9–10.3)
Chloride: 101 mmol/L (ref 98–111)
Creatinine, Ser: 1.27 mg/dL — ABNORMAL HIGH (ref 0.61–1.24)
GFR calc Af Amer: >60 mL/min (ref >60)
GFR calc non Af Amer: 58 mL/min — ABNORMAL LOW (ref >60)
Glucose, Bld: 218 mg/dL — ABNORMAL HIGH (ref 70–99)
Potassium: 4.2 mmol/L (ref 3.5–5.1)
Sodium: 135 mmol/L (ref 135–145)
Total Bilirubin: 0.6 mg/dL (ref 0.3–1.2)
Total Protein: 7.2 g/dL (ref 6.5–8.1)

## 2020-03-28 LAB — CBC WITH DIFFERENTIAL/PLATELET
Abs Immature Granulocytes: 0.02 10*3/uL (ref 0.00–0.07)
Basophils Absolute: 0.1 10*3/uL (ref 0.0–0.1)
Basophils Relative: 1 %
Eosinophils Absolute: 0.2 10*3/uL (ref 0.0–0.5)
Eosinophils Relative: 3 %
HCT: 45 % (ref 39.0–52.0)
Hemoglobin: 14 g/dL (ref 13.0–17.0)
Immature Granulocytes: 0 %
Lymphocytes Relative: 24 %
Lymphs Abs: 1.7 10*3/uL (ref 0.7–4.0)
MCH: 26.9 pg (ref 26.0–34.0)
MCHC: 31.1 g/dL (ref 30.0–36.0)
MCV: 86.5 fL (ref 80.0–100.0)
Monocytes Absolute: 0.6 10*3/uL (ref 0.1–1.0)
Monocytes Relative: 8 %
Neutro Abs: 4.5 10*3/uL (ref 1.7–7.7)
Neutrophils Relative %: 64 %
Platelets: 280 10*3/uL (ref 150–400)
RBC: 5.2 MIL/uL (ref 4.22–5.81)
RDW: 13.8 % (ref 11.5–15.5)
WBC: 7 10*3/uL (ref 4.0–10.5)
nRBC: 0 % (ref 0.0–0.2)

## 2020-03-28 NOTE — Progress Notes (Signed)
[name] presents today for phlebotomy per MD orders. Phlebotomy procedure started at 13:34pm and ended at 13:40pm. 500 cc removed. Patient tolerated procedure well. IV needle removed intact. hgb today 14.0.  Tolerated without adverse affects. Vital signs stable. No complaints at this time. Discharged from clinic ambulatory. F/U with [facility] as scheduled.

## 2020-03-28 NOTE — Patient Instructions (Signed)
[facility] at [facility]  Discharge Instructions:   _______________________________________________________________  Thank you for choosing [facility] at [facility] to provide your oncology and hematology care.  To afford each patient quality time with our providers, please arrive at least 15 minutes before your scheduled appointment.  You need to re-schedule your appointment if you arrive 10 or more minutes late.  We strive to give you quality time with our providers, and arriving late affects you and other patients whose appointments are after yours.  Also, if you no show three or more times for appointments you may be dismissed from the clinic.  Again, thank you for choosing [facility] at [facility]. Our hope is that these requests will allow you access to exceptional care and in a timely manner. _______________________________________________________________  If you have questions after your visit, please contact our office at [phone] between the hours of 8:30 a.m. and 5:00 p.m. Voicemails left after 4:30 p.m. will not be returned until the following business day. _______________________________________________________________  For prescription refill requests, have your pharmacy contact our office. _______________________________________________________________  Recommendations made by the consultant and any test results will be sent to your referring physician. _______________________________________________________________ 

## 2020-05-09 ENCOUNTER — Inpatient Hospital Stay (HOSPITAL_BASED_OUTPATIENT_CLINIC_OR_DEPARTMENT_OTHER): Payer: Medicare Other | Admitting: Nurse Practitioner

## 2020-05-09 ENCOUNTER — Encounter (HOSPITAL_COMMUNITY): Payer: Self-pay

## 2020-05-09 ENCOUNTER — Other Ambulatory Visit: Payer: Self-pay

## 2020-05-09 ENCOUNTER — Inpatient Hospital Stay (HOSPITAL_COMMUNITY): Payer: Medicare Other | Attending: Hematology

## 2020-05-09 ENCOUNTER — Inpatient Hospital Stay (HOSPITAL_COMMUNITY): Payer: Medicare Other

## 2020-05-09 DIAGNOSIS — D751 Secondary polycythemia: Secondary | ICD-10-CM

## 2020-05-09 DIAGNOSIS — Z7982 Long term (current) use of aspirin: Secondary | ICD-10-CM | POA: Diagnosis not present

## 2020-05-09 DIAGNOSIS — G4730 Sleep apnea, unspecified: Secondary | ICD-10-CM | POA: Insufficient documentation

## 2020-05-09 DIAGNOSIS — Z7984 Long term (current) use of oral hypoglycemic drugs: Secondary | ICD-10-CM | POA: Diagnosis not present

## 2020-05-09 DIAGNOSIS — Z79899 Other long term (current) drug therapy: Secondary | ICD-10-CM | POA: Diagnosis not present

## 2020-05-09 DIAGNOSIS — K219 Gastro-esophageal reflux disease without esophagitis: Secondary | ICD-10-CM | POA: Insufficient documentation

## 2020-05-09 DIAGNOSIS — I10 Essential (primary) hypertension: Secondary | ICD-10-CM | POA: Diagnosis not present

## 2020-05-09 DIAGNOSIS — E119 Type 2 diabetes mellitus without complications: Secondary | ICD-10-CM | POA: Diagnosis not present

## 2020-05-09 DIAGNOSIS — R972 Elevated prostate specific antigen [PSA]: Secondary | ICD-10-CM | POA: Insufficient documentation

## 2020-05-09 LAB — COMPREHENSIVE METABOLIC PANEL
ALT: 24 U/L (ref 0–44)
AST: 26 U/L (ref 15–41)
Albumin: 3.9 g/dL (ref 3.5–5.0)
Alkaline Phosphatase: 74 U/L (ref 38–126)
Anion gap: 12 (ref 5–15)
BUN: 22 mg/dL (ref 8–23)
CO2: 22 mmol/L (ref 22–32)
Calcium: 9.4 mg/dL (ref 8.9–10.3)
Chloride: 105 mmol/L (ref 98–111)
Creatinine, Ser: 1.31 mg/dL — ABNORMAL HIGH (ref 0.61–1.24)
GFR calc Af Amer: >60 mL/min (ref >60)
GFR calc non Af Amer: 56 mL/min — ABNORMAL LOW (ref >60)
Glucose, Bld: 132 mg/dL — ABNORMAL HIGH (ref 70–99)
Potassium: 4.3 mmol/L (ref 3.5–5.1)
Sodium: 139 mmol/L (ref 135–145)
Total Bilirubin: 1.1 mg/dL (ref 0.3–1.2)
Total Protein: 7.3 g/dL (ref 6.5–8.1)

## 2020-05-09 LAB — CBC WITH DIFFERENTIAL/PLATELET
Abs Immature Granulocytes: 0.01 10*3/uL (ref 0.00–0.07)
Basophils Absolute: 0.1 10*3/uL (ref 0.0–0.1)
Basophils Relative: 1 %
Eosinophils Absolute: 0.2 10*3/uL (ref 0.0–0.5)
Eosinophils Relative: 3 %
HCT: 43.2 % (ref 39.0–52.0)
Hemoglobin: 13.6 g/dL (ref 13.0–17.0)
Immature Granulocytes: 0 %
Lymphocytes Relative: 24 %
Lymphs Abs: 1.3 10*3/uL (ref 0.7–4.0)
MCH: 26.9 pg (ref 26.0–34.0)
MCHC: 31.5 g/dL (ref 30.0–36.0)
MCV: 85.4 fL (ref 80.0–100.0)
Monocytes Absolute: 0.5 10*3/uL (ref 0.1–1.0)
Monocytes Relative: 10 %
Neutro Abs: 3.5 10*3/uL (ref 1.7–7.7)
Neutrophils Relative %: 62 %
Platelets: 271 10*3/uL (ref 150–400)
RBC: 5.06 MIL/uL (ref 4.22–5.81)
RDW: 14.4 % (ref 11.5–15.5)
WBC: 5.6 10*3/uL (ref 4.0–10.5)
nRBC: 0 % (ref 0.0–0.2)

## 2020-05-09 LAB — IRON AND TIBC
Iron: 60 ug/dL (ref 45–182)
Saturation Ratios: 13 % — ABNORMAL LOW (ref 17.9–39.5)
TIBC: 458 ug/dL — ABNORMAL HIGH (ref 250–450)
UIBC: 398 ug/dL

## 2020-05-09 LAB — LACTATE DEHYDROGENASE: LDH: 141 U/L (ref 98–192)

## 2020-05-09 LAB — VITAMIN B12: Vitamin B-12: 271 pg/mL (ref 180–914)

## 2020-05-09 LAB — VITAMIN D 25 HYDROXY (VIT D DEFICIENCY, FRACTURES): Vit D, 25-Hydroxy: 31.52 ng/mL (ref 30–100)

## 2020-05-09 LAB — FERRITIN: Ferritin: 11 ng/mL — ABNORMAL LOW (ref 24–336)

## 2020-05-09 NOTE — Patient Instructions (Signed)
[facility] at [facility]  Discharge Instructions:   _______________________________________________________________  Thank you for choosing [facility] at [facility] to provide your oncology and hematology care.  To afford each patient quality time with our providers, please arrive at least 15 minutes before your scheduled appointment.  You need to re-schedule your appointment if you arrive 10 or more minutes late.  We strive to give you quality time with our providers, and arriving late affects you and other patients whose appointments are after yours.  Also, if you no show three or more times for appointments you may be dismissed from the clinic.  Again, thank you for choosing [facility] at [facility]. Our hope is that these requests will allow you access to exceptional care and in a timely manner. _______________________________________________________________  If you have questions after your visit, please contact our office at [phone] between the hours of 8:30 a.m. and 5:00 p.m. Voicemails left after 4:30 p.m. will not be returned until the following business day. _______________________________________________________________  For prescription refill requests, have your pharmacy contact our office. _______________________________________________________________  Recommendations made by the consultant and any test results will be sent to your referring physician. _______________________________________________________________ 

## 2020-05-09 NOTE — Progress Notes (Signed)
[name] presents today for phlebotomy per MD orders. Phlebotomy procedure started at 13:37pm and ended at 13:43pm. 500 milliliters removed. Patient observed for 10 minutes after procedure without any incident. Patient tolerated procedure well. IV needle removed intact. Vital signs stable. No complaints at this time. Discharged from clinic ambulatory. F/U with [facility] as scheduled.

## 2020-05-09 NOTE — Progress Notes (Signed)
[name], [place] [address]   CLINIC:  Medical Oncology/Hematology  PCP:  Dettinger, Fransisca Kaufmann, [place] [id number] [phone]   REASON FOR VISIT: Follow-up for secondary polycythemia  CURRENT THERAPY: Phlebotomies every 6 weeks   INTERVAL HISTORY:  [name] 69 y.o. male returns for routine follow-up for secondary polycythemia.  Patient reports he has been doing well with every 6-week phlebotomies.  He can tell when he needs a phlebotomy he gets hot sweaty and thirsty. Denies any nausea, vomiting, or diarrhea. Denies any new pains. Had not noticed any recent bleeding such as epistaxis, hematuria or hematochezia. Denies recent chest pain on exertion, shortness of breath on minimal exertion, pre-syncopal episodes, or palpitations. Denies any numbness or tingling in hands or feet. Denies any recent fevers, infections, or recent hospitalizations. Patient reports appetite at 100% and energy level at 75%.  He is eating well maintain his weight at this time.     REVIEW OF SYSTEMS:  Review of Systems  All other systems reviewed and are negative.    PAST MEDICAL/SURGICAL HISTORY:  Past Medical History:  Diagnosis Date  . Diabetes mellitus without complication ([place])   . GERD (gastroesophageal reflux disease)   . Hx of [place] spotted fever [id number]  . Hypertension   . Sleep apnea    wears cpap    Past Surgical History:  Procedure Laterality Date  . COLONOSCOPY  09/2012   Dr. Britta Mccreedy. Diverticulosis, descending adenomatous colon polyp, hyperplastic rectal polyp. ?mass in anal canal evaluated by surgery and consistent with internal hemorrhoid  . COLONOSCOPY WITH PROPOFOL N/A 02/07/2019   Procedure: COLONOSCOPY WITH PROPOFOL;  Surgeon: Danie Binder, MD;  Location: [facility];  Service: Endoscopy;  Laterality: N/A;  9:30am  . KNEE SURGERY Left 10/2014  . KNEE SURGERY Right 09/2015  . POLYPECTOMY  02/07/2019   Procedure: POLYPECTOMY;  Surgeon: Danie Binder, MD;  Location: [facility];  Service: Endoscopy;;     SOCIAL HISTORY:  Social History   Socioeconomic History  . Marital status: Single    Spouse name: Not on file  . Number of children: 0  . Years of education: 2  . Highest education level: 12th grade  Occupational History  . 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 Smoking status: Never Smoker  . Smokeless tobacco: Never Used  Substance and Sexual Activity  . Alcohol use: No  . Drug use: No  . Sexual activity: Not Currently  Other Topics Concern  . Not on file  Social History Narrative   WORKED AT [employer] > 10 YRS. NOT MARRIED. FUR BABIES: 3 DOGS AND 5 CATS. SPENDS FREE TIME: [place], [place], [place].   Social Determinants of Health   Financial Resource Strain:   . Difficulty of Paying Living Expenses:   Food Insecurity:   . Worried About Charity fundraiser in the Last Year:   . Arboriculturist in the Last Year:   Transportation Needs:   . Film/video editor (Medical):   Marland Kitchen Lack of Transportation (Non-Medical):   Physical Activity:   . Days of Exercise per Week:   . Minutes of Exercise per Session:   Stress:   . Feeling of Stress :   Social Connections: Unknown  . Frequency of Communication with Friends and Family: Not on file  . Frequency of Social Gatherings with Friends and  Family: Three times a week  . Attends Religious Services: Not on file  . Active Member of Clubs or Organizations: Not on file  . Attends Archivist Meetings: Not on file  . Marital Status: Not on file  Intimate Partner Violence:   . Fear of Current or Ex-Partner:   . Emotionally Abused:   Marland Kitchen Physically Abused:   . Sexually Abused:     FAMILY HISTORY:  Family History  Problem Relation Age of Onset  . Heart disease Mother   . Congestive Heart Failure Mother   . Diabetes Father   . Aneurysm Sister   . Diabetes Paternal  Uncle   . Diabetes Paternal Grandmother   . Colon cancer Neg Hx   . Colon polyps Neg Hx     CURRENT MEDICATIONS:  Outpatient Encounter Medications as of 05/09/2020  Medication Sig  . aspirin EC 81 MG tablet Take 81 mg by mouth every evening.  . gabapentin (NEURONTIN) 100 MG capsule Take 2 capsules (200 mg total) by mouth at bedtime.  Marland Kitchen glucose blood test strip Use to check blood glucose once daily at varying times.  Dispense Verio One Touch Test strips  . irbesartan (AVAPRO) 150 MG tablet Take 1 tablet (150 mg total) by mouth daily.  . metFORMIN (GLUCOPHAGE) 1000 MG tablet Take 1 tablet (1,000 mg total) by mouth 2 (two) times daily with a meal.  . Multiple Vitamins-Minerals (CENTRUM SILVER 50+MEN PO) Take 1 tablet by mouth daily. Every now and then  . Omega-3 Fatty Acids (FISH OIL) 500 MG CAPS Take 500 mg by mouth 2 (two) times a week.   [name] DELICA LANCETS [id number] MISC Use to check blood glucose once daily at varying times  . oxymetazoline (AFRIN) 0.05 % nasal spray Place 1 spray into both nostrils at bedtime as needed for congestion.  . Probiotic Product (PROBIOTIC PO) Take 1 capsule by mouth 3 (three) times a week.   . ranitidine (RANITIDINE 150 MAX STRENGTH) 150 MG tablet Take 150 mg by mouth 2 (two) times daily.  . tamsulosin (FLOMAX) 0.4 MG CAPS capsule Take 0.4 mg by mouth daily.   No facility-administered encounter medications on file as of 05/09/2020.    ALLERGIES:  Allergies  Allergen Reactions  . Ramipril Cough     PHYSICAL EXAM:  ECOG Performance status: 1   Vital signs: Blood pressure 121/78 Pulse 72 Respirations 19 Temperature 96.8 O2 96% on room air  Physical Exam Constitutional:      Appearance: Normal appearance. He is normal weight.  Cardiovascular:     Rate and Rhythm: Normal rate and regular rhythm.     Heart sounds: Normal heart sounds.  Pulmonary:     Effort: Pulmonary effort is normal.     Breath sounds: Normal breath sounds.  Abdominal:      General: Bowel sounds are normal.     Palpations: Abdomen is soft.  Musculoskeletal:        General: Normal range of motion.  Skin:    General: Skin is warm.  Neurological:     Mental Status: He is alert and oriented to person, place, and time. Mental status is at baseline.  Psychiatric:        Mood and Affect: Mood normal.        Behavior: Behavior normal.        Thought Content: Thought content normal.        Judgment: Judgment normal.      LABORATORY DATA:  I have  reviewed the labs as listed.  CBC    Component Value Date/Time   WBC 5.6 05/09/2020 1128   RBC 5.06 05/09/2020 1128   HGB 13.6 05/09/2020 1128   HGB 18.5 (H) 12/23/2018 0804   HCT 43.2 05/09/2020 1128   HCT 53.3 (H) 12/23/2018 0804   PLT 271 05/09/2020 1128   PLT 246 12/23/2018 0804   MCV 85.4 05/09/2020 1128   MCV 95 12/23/2018 0804   MCH 26.9 05/09/2020 1128   MCHC 31.5 05/09/2020 1128   RDW 14.4 05/09/2020 1128   RDW 12.6 12/23/2018 0804   LYMPHSABS 1.3 05/09/2020 1128   LYMPHSABS 1.4 12/23/2018 0804   MONOABS 0.5 05/09/2020 1128   EOSABS 0.2 05/09/2020 1128   EOSABS 0.1 12/23/2018 0804   BASOSABS 0.1 05/09/2020 1128   BASOSABS 0.1 12/23/2018 0804   CMP Latest Ref Rng & Units 05/09/2020 03/28/2020 02/15/2020  Glucose 70 - 99 mg/dL 132(H) 218(H) 200(H)  BUN 8 - 23 mg/dL 22 21 24(H)  Creatinine 0.61 - 1.24 mg/dL 1.31(H) 1.27(H) 1.54(H)  Sodium 135 - 145 mmol/L 139 135 136  Potassium 3.5 - 5.1 mmol/L 4.3 4.2 4.6  Chloride 98 - 111 mmol/L 105 101 104  CO2 22 - 32 mmol/L 22 23 23   Calcium 8.9 - 10.3 mg/dL 9.4 9.0 9.2  Total Protein 6.5 - 8.1 g/dL 7.3 7.2 7.6  Total Bilirubin 0.3 - 1.2 mg/dL 1.1 0.6 0.7  Alkaline Phos 38 - 126 U/L 74 79 84  AST 15 - 41 U/L 26 22 24   ALT 0 - 44 U/L 24 23 23     All questions were answered to patient's stated satisfaction. Encouraged patient to call with any new concerns or questions before his next visit to the cancer center and we can certain see him sooner, if needed.      ASSESSMENT & PLAN:  Polycythemia, secondary 1.  Secondary polycythemia: - Patient reports he clotted when he would go to donate blood.  He was always hot and very thirsty.  PCP noticed his hematocrit and hemoglobin were elevated and sent him to hematology. - His blood work was evaluated he was negative for HIV, Jak 2 and BCR/ABL.  EPO level was normal at 10.  SPEP, ANA, CRP, RF were all normal. - Hemochromatosis gene evaluation showed he had a single C282Y gene and is a carrier for hemochromatosis. - He also reports he has sleep apnea and uses a CPAP machine at night.  He was referred for another sleep study evaluation.  But they canceled his appointment due to the COVID crisis. -Patient denies smoking history.  Denies any headaches or blurred vision.  Denies any skin changes. - Patient had his first 2 500 mL phlebotomies on 02/09/2019 in 02/23/2019.  He reports he is not as hot or thirsty since the phlebotomies. - He has been averaging a phlebotomy every 6 weeks. -Labs on 05/09/2020 showed his hemoglobin 13.6 and hematocrit 43.2 -He will receive phlebotomy today -We will maintain his hematocrit around 45 this helps all of his symptoms to remain lower. -We will continue this every 6 weeks. -We will follow-up in 3 months with labs.  2.  Sleep apnea: - We have discussed with the patient that obstructive sleep apnea may cause secondary polycythemia. - Patient has been referred for a sleep study evaluation. - The appointment was canceled due to the [place] crisis we will reschedule after. -Patient reports he uses a CPAP machine at night  3.  Positive hep B serology: -  Patient has positive hep B surface antibody and positive hep B core antibody. Surface antigen and IgM are negative.  Hep C and HIV are negative.  Patient is immune based on prior exposure.  4.  Elevated PSA: -Patient has a PSA of 9.2 -He is followed by [facility]. -He had a prostate biopsy on 03/30/2017 with 12 core  submitted that were benign. -Patient will continue to follow-up with urology as directed.  5.  Health maintenance: -Patient had a colonoscopy on 02/07/2019 that showed a 5 mm polyp in the ascending colon that showed tubular adenoma.  Diverticulosis in the rectosigmoid colon and the ascending colon.  Torturous colon.  External and internal hemorrhoids. -Patient will follow-up with GI as recommended.     Orders placed this encounter:  Orders Placed This Encounter  Procedures  . Lactate dehydrogenase  . CBC with Differential/Platelet  . Comprehensive metabolic panel  . Ferritin  . Iron and TIBC  . Vitamin B12  . VITAMIN D 25 Hydroxy (Vit-D Deficiency, Fractures)      Francene Finders, FNP-C [place] [phone]

## 2020-05-09 NOTE — Assessment & Plan Note (Signed)
1.  Secondary polycythemia: - Patient reports he clotted when he would go to donate blood.  He was always hot and very thirsty.  PCP noticed his hematocrit and hemoglobin were elevated and sent him to hematology. - His blood work was evaluated he was negative for HIV, Jak 2 and BCR/ABL.  EPO level was normal at 10.  SPEP, ANA, CRP, RF were all normal. - Hemochromatosis gene evaluation showed he had a single C282Y gene and is a carrier for hemochromatosis. - He also reports he has sleep apnea and uses a CPAP machine at night.  He was referred for another sleep study evaluation.  But they canceled his appointment due to the COVID crisis. -Patient denies smoking history.  Denies any headaches or blurred vision.  Denies any skin changes. - Patient had his first 2 500 mL phlebotomies on 02/09/2019 in 02/23/2019.  He reports he is not as hot or thirsty since the phlebotomies. - He has been averaging a phlebotomy every 6 weeks. -Labs on 05/09/2020 showed his hemoglobin 13.6 and hematocrit 43.2 -He will receive phlebotomy today -We will maintain his hematocrit around 45 this helps all of his symptoms to remain lower. -We will continue this every 6 weeks. -We will follow-up in 3 months with labs.  2.  Sleep apnea: - We have discussed with the patient that obstructive sleep apnea may cause secondary polycythemia. - Patient has been referred for a sleep study evaluation. - The appointment was canceled due to the [place] crisis we will reschedule after. -Patient reports he uses a CPAP machine at night  3.  Positive hep B serology: - Patient has positive hep B surface antibody and positive hep B core antibody. Surface antigen and IgM are negative.  Hep C and HIV are negative.  Patient is immune based on prior exposure.  4.  Elevated PSA: -Patient has a PSA of 9.2 -He is followed by advanced [facility]. -He had a prostate biopsy on 03/30/2017 with 12 core submitted that were benign. -Patient will continue to  follow-up with urology as directed.  5.  Health maintenance: -Patient had a colonoscopy on 02/07/2019 that showed a 5 mm polyp in the ascending colon that showed tubular adenoma.  Diverticulosis in the rectosigmoid colon and the ascending colon.  Torturous colon.  External and internal hemorrhoids. -Patient will follow-up with GI as recommended.

## 2020-06-17 ENCOUNTER — Encounter (HOSPITAL_COMMUNITY): Payer: Self-pay | Admitting: Nurse Practitioner

## 2020-06-21 ENCOUNTER — Encounter (HOSPITAL_COMMUNITY): Payer: Medicare Other

## 2020-06-27 ENCOUNTER — Inpatient Hospital Stay (HOSPITAL_COMMUNITY): Payer: Medicare Other | Attending: Hematology

## 2020-06-27 DIAGNOSIS — D751 Secondary polycythemia: Secondary | ICD-10-CM | POA: Insufficient documentation

## 2020-06-27 NOTE — Progress Notes (Signed)
[name] presents today for phlebotomy per MD orders. Phlebotomy procedure started at 1407 and ended at 1412. 500 cc removed. Patient tolerated procedure well. IV needle removed intact.   Patient denied any complaints with phlebotomy.  Right peripheral IV site clean and dry with no bruising or swelling noted at site.  Band aid applied.  Left ambulatory with no s/s of distress noted and in satisfactory condition.

## 2020-08-01 ENCOUNTER — Encounter: Payer: Self-pay | Admitting: Family Medicine

## 2020-08-01 MED ORDER — METRONIDAZOLE 500 MG PO TABS: 500.0000 mg | ORAL_TABLET | Freq: Two times a day (BID) | ORAL | 0 refills | Status: DC

## 2020-08-01 MED ORDER — CIPROFLOXACIN HCL 500 MG PO TABS
500.0000 mg | ORAL_TABLET | Freq: Two times a day (BID) | ORAL | 0 refills | Status: DC
Start: 2020-08-01 — End: 2020-09-13

## 2020-08-02 ENCOUNTER — Inpatient Hospital Stay (HOSPITAL_BASED_OUTPATIENT_CLINIC_OR_DEPARTMENT_OTHER): Payer: Medicare Other | Admitting: Nurse Practitioner

## 2020-08-02 ENCOUNTER — Other Ambulatory Visit: Payer: Self-pay

## 2020-08-02 ENCOUNTER — Inpatient Hospital Stay (HOSPITAL_COMMUNITY): Payer: Medicare Other

## 2020-08-02 ENCOUNTER — Inpatient Hospital Stay (HOSPITAL_COMMUNITY): Payer: Medicare Other | Attending: Hematology

## 2020-08-02 DIAGNOSIS — E119 Type 2 diabetes mellitus without complications: Secondary | ICD-10-CM | POA: Diagnosis not present

## 2020-08-02 DIAGNOSIS — R972 Elevated prostate specific antigen [PSA]: Secondary | ICD-10-CM | POA: Diagnosis not present

## 2020-08-02 DIAGNOSIS — D751 Secondary polycythemia: Secondary | ICD-10-CM | POA: Diagnosis not present

## 2020-08-02 DIAGNOSIS — K219 Gastro-esophageal reflux disease without esophagitis: Secondary | ICD-10-CM | POA: Insufficient documentation

## 2020-08-02 DIAGNOSIS — I10 Essential (primary) hypertension: Secondary | ICD-10-CM | POA: Insufficient documentation

## 2020-08-02 DIAGNOSIS — Z7982 Long term (current) use of aspirin: Secondary | ICD-10-CM | POA: Diagnosis not present

## 2020-08-02 DIAGNOSIS — Z7984 Long term (current) use of oral hypoglycemic drugs: Secondary | ICD-10-CM | POA: Diagnosis not present

## 2020-08-02 DIAGNOSIS — Z79899 Other long term (current) drug therapy: Secondary | ICD-10-CM | POA: Diagnosis not present

## 2020-08-02 DIAGNOSIS — G4730 Sleep apnea, unspecified: Secondary | ICD-10-CM | POA: Diagnosis not present

## 2020-08-02 LAB — CBC WITH DIFFERENTIAL/PLATELET
Abs Immature Granulocytes: 0.03 10*3/uL (ref 0.00–0.07)
Basophils Absolute: 0 10*3/uL (ref 0.0–0.1)
Basophils Relative: 1 %
Eosinophils Absolute: 0.1 10*3/uL (ref 0.0–0.5)
Eosinophils Relative: 2 %
HCT: 41.4 % (ref 39.0–52.0)
Hemoglobin: 12.8 g/dL — ABNORMAL LOW (ref 13.0–17.0)
Immature Granulocytes: 1 %
Lymphocytes Relative: 20 %
Lymphs Abs: 1.2 10*3/uL (ref 0.7–4.0)
MCH: 25.8 pg — ABNORMAL LOW (ref 26.0–34.0)
MCHC: 30.9 g/dL (ref 30.0–36.0)
MCV: 83.3 fL (ref 80.0–100.0)
Monocytes Absolute: 0.6 10*3/uL (ref 0.1–1.0)
Monocytes Relative: 9 %
Neutro Abs: 4 10*3/uL (ref 1.7–7.7)
Neutrophils Relative %: 67 %
Platelets: 285 10*3/uL (ref 150–400)
RBC: 4.97 MIL/uL (ref 4.22–5.81)
RDW: 15.3 % (ref 11.5–15.5)
WBC: 6 10*3/uL (ref 4.0–10.5)
nRBC: 0 % (ref 0.0–0.2)

## 2020-08-02 LAB — COMPREHENSIVE METABOLIC PANEL
ALT: 20 U/L (ref 0–44)
AST: 21 U/L (ref 15–41)
Albumin: 3.7 g/dL (ref 3.5–5.0)
Alkaline Phosphatase: 73 U/L (ref 38–126)
Anion gap: 9 (ref 5–15)
BUN: 19 mg/dL (ref 8–23)
CO2: 24 mmol/L (ref 22–32)
Calcium: 9.2 mg/dL (ref 8.9–10.3)
Chloride: 105 mmol/L (ref 98–111)
Creatinine, Ser: 1.26 mg/dL — ABNORMAL HIGH (ref 0.61–1.24)
GFR calc Af Amer: >60 mL/min (ref >60)
GFR calc non Af Amer: 58 mL/min — ABNORMAL LOW (ref >60)
Glucose, Bld: 206 mg/dL — ABNORMAL HIGH (ref 70–99)
Potassium: 4.3 mmol/L (ref 3.5–5.1)
Sodium: 138 mmol/L (ref 135–145)
Total Bilirubin: 1.1 mg/dL (ref 0.3–1.2)
Total Protein: 7.6 g/dL (ref 6.5–8.1)

## 2020-08-02 LAB — IRON AND TIBC
Iron: 49 ug/dL (ref 45–182)
Saturation Ratios: 11 % — ABNORMAL LOW (ref 17.9–39.5)
TIBC: 435 ug/dL (ref 250–450)
UIBC: 386 ug/dL

## 2020-08-02 LAB — VITAMIN D 25 HYDROXY (VIT D DEFICIENCY, FRACTURES): Vit D, 25-Hydroxy: 31.92 ng/mL (ref 30–100)

## 2020-08-02 LAB — FERRITIN: Ferritin: 14 ng/mL — ABNORMAL LOW (ref 24–336)

## 2020-08-02 LAB — VITAMIN B12: Vitamin B-12: 234 pg/mL (ref 180–914)

## 2020-08-02 LAB — LACTATE DEHYDROGENASE: LDH: 117 U/L (ref 98–192)

## 2020-08-02 NOTE — Assessment & Plan Note (Signed)
1.  Secondary polycythemia: - Patient reports he clotted when he would go to donate blood.  He was always hot and very thirsty.  PCP noticed his hematocrit and hemoglobin were elevated and sent him to hematology. - His blood work was evaluated he was negative for HIV, Jak 2 and BCR/ABL.  EPO level was normal at 10.  SPEP, ANA, CRP, RF were all normal. - Hemochromatosis gene evaluation showed he had a single C282Y gene and is a carrier for hemochromatosis. - He also reports he has sleep apnea and uses a CPAP machine at night.  He was referred for another sleep study evaluation.  But they canceled his appointment due to the COVID crisis. -Patient denies smoking history.  Denies any headaches or blurred vision.  Denies any skin changes. - Patient had his first 2 500 mL phlebotomies on 02/09/2019 in 02/23/2019.  He reports he is not as hot or thirsty since the phlebotomies. - He has been averaging a phlebotomy every 6 weeks. -Labs on 08/02/2020 showed his hemoglobin 12.8 and hematocrit 41.4 -He will receive phlebotomy today -We will maintain his hematocrit around 45 this helps all of his symptoms to remain lower. -We will continue this every 6 weeks. -We will follow-up in 6 weeks with labs   2.  Sleep apnea: - We have discussed with the patient that obstructive sleep apnea may cause secondary polycythemia. - Patient has been referred for a sleep study evaluation. - The appointment was canceled due to the [place] crisis we will reschedule after. -Patient reports he uses a CPAP machine at night  3.  Positive hep B serology: - Patient has positive hep B surface antibody and positive hep B core antibody. Surface antigen and IgM are negative.  Hep C and HIV are negative.  Patient is immune based on prior exposure.  4.  Elevated PSA: -Patient has a PSA of 9.2 -He is followed by [facility]. -He had a prostate biopsy on 03/30/2017 with 12 core submitted that were benign. -Patient will continue to  follow-up with urology as directed.  5.  Health maintenance: -Patient had a colonoscopy on 02/07/2019 that showed a 5 mm polyp in the ascending colon that showed tubular adenoma.  Diverticulosis in the rectosigmoid colon and the ascending colon.  Torturous colon.  External and internal hemorrhoids. -Patient will follow-up with GI as recommended.

## 2020-08-02 NOTE — Progress Notes (Signed)
[name], [place] [address]   CLINIC:  Medical Oncology/Hematology  PCP:  Dettinger, Fransisca Kaufmann, MD [place] [address] [phone]   REASON FOR VISIT: Follow-up for secondary polycythemia   CURRENT THERAPY: Intermittent phlebotomies    INTERVAL HISTORY:  [name] 69 y.o. male returns for routine follow-up for secondary polycythemia.  Patient reports he is starting to have symptoms of being hot and sweaty again.  He also reports that his thirst level increased significantly when it is time for his phlebotomies. Denies any nausea, vomiting, or diarrhea. Denies any new pains. Had not noticed any recent bleeding such as epistaxis, hematuria or hematochezia. Denies recent chest pain on exertion, shortness of breath on minimal exertion, pre-syncopal episodes, or palpitations. Denies any numbness or tingling in hands or feet. Denies any recent fevers, infections, or recent hospitalizations. Patient reports appetite at 75% and energy level at 75%.  He is eating well maintain his weight this time.     REVIEW OF SYSTEMS:  Review of Systems  Constitutional: Positive for fatigue.       Hot and sweaty and thirsty  Psychiatric/Behavioral: Positive for sleep disturbance.     PAST MEDICAL/SURGICAL HISTORY:  Past Medical History:  Diagnosis Date  . Diabetes mellitus without complication ([place])   . GERD (gastroesophageal reflux disease)   . Hx of Rocky Mountain spotted fever 1-1-12011  . Hypertension   . Sleep apnea    wears cpap    Past Surgical History:  Procedure Laterality Date  . COLONOSCOPY  09/2012   Dr. Britta Mccreedy. Diverticulosis, descending adenomatous colon polyp, hyperplastic rectal polyp. ?mass in anal canal evaluated by surgery and consistent with internal hemorrhoid  . COLONOSCOPY WITH PROPOFOL N/A 02/07/2019   Procedure: COLONOSCOPY WITH PROPOFOL;  Surgeon: Danie Binder, MD;  [facility];  Service:  Endoscopy;  Laterality: N/A;  9:30am  . KNEE SURGERY Left 10/2014  . KNEE SURGERY Right 09/2015  . POLYPECTOMY  02/07/2019   Procedure: POLYPECTOMY;  Surgeon: Danie Binder, MD;  [facility];  Service: Endoscopy;;     SOCIAL HISTORY:  Social History   Socioeconomic History  . Marital status: Single    Spouse name: Not on file  . Number of children: 0  . Years of education: 46  . Highest education level: 12th grade  Occupational History  . 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 Smoking status: Never Smoker  . Smokeless tobacco: Never Used  Vaping Use  . Vaping Use: Never used  Substance and Sexual Activity  . Alcohol use: No  . Drug use: No  . Sexual activity: Not Currently  Other Topics Concern  . Not on file  Social History Narrative   WORKED AT [employer] > 10 YRS. NOT MARRIED. FUR BABIES: 3 DOGS AND 5 CATS. SPENDS FREE TIME: [place], [place], [place].   Social Determinants of Health   Financial Resource Strain:   . Difficulty of Paying Living Expenses: Not on file  Food Insecurity:   . Worried About Charity fundraiser in the Last Year: Not on file  . Ran Out of Food in the Last Year: Not on file  Transportation Needs:   . Lack of Transportation (Medical): Not on file  . Lack of Transportation (Non-Medical): Not on file  Physical Activity:   . Days of Exercise per Week: Not on file  .  Minutes of Exercise per Session: Not on file  Stress:   . Feeling of Stress : Not on file  Social Connections: Unknown  . Frequency of Communication with Friends and Family: Not on file  . Frequency of Social Gatherings with Friends and Family: Three times a week  . Attends Religious Services: Not on file  . Active Member of Clubs or Organizations: Not on file  . Attends Archivist Meetings: Not on file  . Marital Status: Not on file  Intimate Partner Violence:   . Fear of Current or Ex-Partner: Not on file  .  Emotionally Abused: Not on file  . Physically Abused: Not on file  . Sexually Abused: Not on file    FAMILY HISTORY:  Family History  Problem Relation Age of Onset  . Heart disease Mother   . Congestive Heart Failure Mother   . Diabetes Father   . Aneurysm Sister   . Diabetes Paternal Uncle   . Diabetes Paternal Grandmother   . Colon cancer Neg Hx   . Colon polyps Neg Hx     CURRENT MEDICATIONS:  Outpatient Encounter Medications as of 08/02/2020  Medication Sig  . aspirin EC 81 MG tablet Take 81 mg by mouth every evening.  . ciprofloxacin (CIPRO) 500 MG tablet Take 1 tablet (500 mg total) by mouth 2 (two) times daily.  Marland Kitchen gabapentin (NEURONTIN) 100 MG capsule Take 2 capsules (200 mg total) by mouth at bedtime.  Marland Kitchen glucose blood test strip Use to check blood glucose once daily at varying times.  Dispense Verio One Touch Test strips  . irbesartan (AVAPRO) 150 MG tablet Take 1 tablet (150 mg total) by mouth daily.  . metFORMIN (GLUCOPHAGE) 1000 MG tablet Take 1 tablet (1,000 mg total) by mouth 2 (two) times daily with a meal.  . metroNIDAZOLE (FLAGYL) 500 MG tablet Take 1 tablet (500 mg total) by mouth 2 (two) times daily.  . Multiple Vitamins-Minerals (CENTRUM SILVER 50+MEN PO) Take 1 tablet by mouth daily. Every now and then  . Omega-3 Fatty Acids (FISH OIL) 500 MG CAPS Take 500 mg by mouth 2 (two) times a week.   [name] DELICA LANCETS 24M MISC Use to check blood glucose once daily at varying times  . Probiotic Product (PROBIOTIC PO) Take 1 capsule by mouth 3 (three) times a week.   . tamsulosin (FLOMAX) 0.4 MG CAPS capsule Take 0.4 mg by mouth daily.  Marland Kitchen oxymetazoline (AFRIN) 0.05 % nasal spray Place 1 spray into both nostrils at bedtime as needed for congestion.  . ranitidine (RANITIDINE 150 MAX STRENGTH) 150 MG tablet Take 150 mg by mouth 2 (two) times daily.   No facility-administered encounter medications on file as of 08/02/2020.    ALLERGIES:  Allergies  Allergen  Reactions  . Ramipril Cough     PHYSICAL EXAM:  ECOG Performance status: 1  Vitals:   08/02/20 0952  BP: 134/76  Pulse: 65  Resp: 18  Temp: (!) 97.2 F (36.2 C)  SpO2: 98%   Filed Weights   08/02/20 0952  Weight: 265 lb 3.2 oz (120.3 kg)   Physical Exam Constitutional:      Appearance: Normal appearance. He is normal weight.  Cardiovascular:     Rate and Rhythm: Normal rate and regular rhythm.     Heart sounds: Normal heart sounds.  Pulmonary:     Effort: Pulmonary effort is normal.     Breath sounds: Normal breath sounds.  Abdominal:  General: Bowel sounds are normal.     Palpations: Abdomen is soft.  Musculoskeletal:        General: Normal range of motion.  Skin:    General: Skin is warm.  Neurological:     Mental Status: He is alert and oriented to person, place, and time. Mental status is at baseline.  Psychiatric:        Mood and Affect: Mood normal.        Behavior: Behavior normal.        Thought Content: Thought content normal.        Judgment: Judgment normal.      LABORATORY DATA:  I have reviewed the labs as listed.  CBC    Component Value Date/Time   WBC 6.0 08/02/2020 0930   RBC 4.97 08/02/2020 0930   HGB 12.8 (L) 08/02/2020 0930   HGB 18.5 (H) 12/23/2018 0804   HCT 41.4 08/02/2020 0930   HCT 53.3 (H) 12/23/2018 0804   PLT 285 08/02/2020 0930   PLT 246 12/23/2018 0804   MCV 83.3 08/02/2020 0930   MCV 95 12/23/2018 0804   MCH 25.8 (L) 08/02/2020 0930   MCHC 30.9 08/02/2020 0930   RDW 15.3 08/02/2020 0930   RDW 12.6 12/23/2018 0804   LYMPHSABS 1.2 08/02/2020 0930   LYMPHSABS 1.4 12/23/2018 0804   MONOABS 0.6 08/02/2020 0930   EOSABS 0.1 08/02/2020 0930   EOSABS 0.1 12/23/2018 0804   BASOSABS 0.0 08/02/2020 0930   BASOSABS 0.1 12/23/2018 0804   CMP Latest Ref Rng & Units 08/02/2020 05/09/2020 03/28/2020  Glucose 70 - 99 mg/dL 206(H) 132(H) 218(H)  BUN 8 - 23 mg/dL 19 22 21   Creatinine 0.61 - 1.24 mg/dL 1.26(H) 1.31(H) 1.27(H)    Sodium 135 - 145 mmol/L 138 139 135  Potassium 3.5 - 5.1 mmol/L 4.3 4.3 4.2  Chloride 98 - 111 mmol/L 105 105 101  CO2 22 - 32 mmol/L 24 22 23   Calcium 8.9 - 10.3 mg/dL 9.2 9.4 9.0  Total Protein 6.5 - 8.1 g/dL 7.6 7.3 7.2  Total Bilirubin 0.3 - 1.2 mg/dL 1.1 1.1 0.6  Alkaline Phos 38 - 126 U/L 73 74 79  AST 15 - 41 U/L 21 26 22   ALT 0 - 44 U/L 20 24 23     All questions were answered to patient's stated satisfaction. Encouraged patient to call with any new concerns or questions before his next visit to the cancer center and we can certain see him sooner, if needed.     ASSESSMENT & PLAN:  Polycythemia, secondary 1.  Secondary polycythemia: - Patient reports he clotted when he would go to donate blood.  He was always hot and very thirsty.  PCP noticed his hematocrit and hemoglobin were elevated and sent him to hematology. - His blood work was evaluated he was negative for HIV, Jak 2 and BCR/ABL.  EPO level was normal at 10.  SPEP, ANA, CRP, RF were all normal. - Hemochromatosis gene evaluation showed he had a single C282Y gene and is a carrier for hemochromatosis. - He also reports he has sleep apnea and uses a CPAP machine at night.  He was referred for another sleep study evaluation.  But they canceled his appointment due to the COVID crisis. -Patient denies smoking history.  Denies any headaches or blurred vision.  Denies any skin changes. - Patient had his first 2 500 mL phlebotomies on 02/09/2019 in 02/23/2019.  He reports he is not as hot or thirsty since the  phlebotomies. - He has been averaging a phlebotomy every 6 weeks. -Labs on 08/02/2020 showed his hemoglobin 12.8 and hematocrit 41.4 -He will receive phlebotomy today -We will maintain his hematocrit around 45 this helps all of his symptoms to remain lower. -We will continue this every 6 weeks. -We will follow-up in 6 weeks with labs   2.  Sleep apnea: - We have discussed with the patient that obstructive sleep apnea may  cause secondary polycythemia. - Patient has been referred for a sleep study evaluation. - The appointment was canceled due to the [place] crisis we will reschedule after. -Patient reports he uses a CPAP machine at night  3.  Positive hep B serology: - Patient has positive hep B surface antibody and positive hep B core antibody. Surface antigen and IgM are negative.  Hep C and HIV are negative.  Patient is immune based on prior exposure.  4.  Elevated PSA: -Patient has a PSA of 9.2 -He is followed by advanced urology. -He had a prostate biopsy on 03/30/2017 with 12 core submitted that were benign. -Patient will continue to follow-up with urology as directed.  5.  Health maintenance: -Patient had a colonoscopy on 02/07/2019 that showed a 5 mm polyp in the ascending colon that showed tubular adenoma.  Diverticulosis in the rectosigmoid colon and the ascending colon.  Torturous colon.  External and internal hemorrhoids. -Patient will follow-up with GI as recommended.     Orders placed this encounter:  Orders Placed This Encounter  Procedures  . CBC with Differential/Platelet  . Comprehensive metabolic panel  . Vitamin B12  . VITAMIN D 25 Hydroxy (Vit-D Deficiency, Fractures)      Francene Finders,  FNP-C [place] [phone]

## 2020-08-02 NOTE — Progress Notes (Signed)
[name] presents today for phlebotomy per MD orders. Phlebotomy procedure started at 1121 and ended at 1128. 500 cc removed. Patient tolerated procedure well. IV needle removed intact.   Patient denied any complaints with phlebotomy.  Right peripheral IV site clean and dry with no bruising or swelling noted at site.  Band aid applied.  Left ambulatory with no s/s of distress noted and in satisfactory condition.

## 2020-09-11 ENCOUNTER — Other Ambulatory Visit: Payer: Self-pay | Admitting: Family Medicine

## 2020-09-12 ENCOUNTER — Other Ambulatory Visit: Payer: Self-pay | Admitting: Family Medicine

## 2020-09-12 DIAGNOSIS — E669 Obesity, unspecified: Secondary | ICD-10-CM

## 2020-09-13 ENCOUNTER — Ambulatory Visit (INDEPENDENT_AMBULATORY_CARE_PROVIDER_SITE_OTHER): Payer: Medicare Other | Admitting: *Deleted

## 2020-09-13 DIAGNOSIS — Z0000 Encounter for general adult medical examination without abnormal findings: Secondary | ICD-10-CM | POA: Diagnosis not present

## 2020-09-13 NOTE — Progress Notes (Signed)
MEDICARE ANNUAL WELLNESS VISIT  09/13/2020  Telephone Visit Disclaimer This Medicare AWV was conducted by telephone due to national recommendations for restrictions regarding the COVID-19 Pandemic (e.g. social distancing).  I verified, using two identifiers, that I am speaking with [name] or their authorized healthcare agent. I discussed the limitations, risks, security, and privacy concerns of performing an evaluation and management service by telephone and the potential availability of an in-person appointment in the future. The patient expressed understanding and agreed to proceed.  Location of Patient: home Location of Provider (nurse): office  Subjective:    [name] is a 69 y.o. male patient of Dettinger, Fransisca Kaufmann, MD who had a Medicare Annual Wellness Visit today via telephone. [place] is Retired and lives alone. he has 0 children. he reports that he is socially active and does interact with friends/family regularly. he is minimally physically active and enjoys gardening.  Patient Care Team: Dettinger, Fransisca Kaufmann, MD as PCP - General (Family Medicine) Glennie Isle, NP-C as Nurse Practitioner (Hematology and Oncology) Franchot Gallo, MD as Consulting Physician (Urology)  Advanced Directives 09/13/2020 08/02/2020 05/09/2020 03/28/2020 01/25/2020 11/24/2019 10/26/2019  Does Patient Have a Medical Advance Directive? Yes Yes No No Yes Yes Yes  Type of Advance Directive Living will;Out of facility DNR (pink MOST or yellow form) Healthcare Power of [place]  Does patient want to make changes to medical advance directive? No - Patient declined No - Patient declined - - No - Patient declined No - Patient declined No - Patient declined  Copy of Short in Chart? - No - copy requested - - No - copy requested No - copy requested No - copy requested  Would patient  like information on creating a medical advance directive? No - Patient declined No - Patient declined No - Patient declined No - Patient declined No - Patient declined No - Patient declined No - Patient declined    Hospital Utilization Over the Past 12 Months: # of hospitalizations or ER visits: 0 # of surgeries: 0  Review of Systems    Patient reports that his overall health is unchanged compared to last year.  History obtained from chart review and the patient  Patient Reported Readings (BP, Pulse, CBG, Weight, etc) none  Pain Assessment Pain : No/denies pain     Current Medications & Allergies (verified) Allergies as of 09/13/2020      Reactions   Ramipril Cough      Medication List       Accurate as of September 13, 2020 11:26 AM. If you have any questions, ask your nurse or doctor.        STOP taking these medications   ciprofloxacin 500 MG tablet Commonly known as: Cipro   Fish Oil 500 MG Caps   metroNIDAZOLE 500 MG tablet Commonly known as: Flagyl   oxymetazoline 0.05 % nasal spray Commonly known as: AFRIN   raNITIdine 150 Max Strength 150 MG tablet Generic drug: ranitidine     TAKE these medications   aspirin EC 81 MG tablet Take 81 mg by mouth every evening.   CENTRUM SILVER 50+MEN PO Take 1 tablet by mouth daily. Every now and then   gabapentin 100 MG capsule Commonly known as: NEURONTIN Take 2 capsules (200 mg total) by mouth at bedtime. (Needs to be seen before next refill)   glucose blood test strip Use to  check blood glucose once daily at varying times.  Dispense Verio One Touch Test strips   irbesartan 150 MG tablet Commonly known as: AVAPRO Take 1 tablet (150 mg total) by mouth daily.   metFORMIN 1000 MG tablet Commonly known as: GLUCOPHAGE Take 1 tablet (1,000 mg total) by mouth 2 (two) times daily with a meal. (Needs to be seen before next refill)   OneTouch Delica Lancets 14N Misc Use to check blood glucose once daily at varying  times   PROBIOTIC PO Take 1 capsule by mouth 3 (three) times a week.   tamsulosin 0.4 MG Caps capsule Commonly known as: FLOMAX Take 0.4 mg by mouth daily.       History (reviewed): Past Medical History:  Diagnosis Date  . Diabetes mellitus without complication ([place])   . GERD (gastroesophageal reflux disease)   . Hx of Rocky Mountain spotted fever 1-1-12011  . Hypertension   . Sleep apnea    wears cpap    Past Surgical History:  Procedure Laterality Date  . COLONOSCOPY  09/2012   Dr. Britta Mccreedy. Diverticulosis, descending adenomatous colon polyp, hyperplastic rectal polyp. ?mass in anal canal evaluated by surgery and consistent with internal hemorrhoid  . COLONOSCOPY WITH PROPOFOL N/A 02/07/2019   Procedure: COLONOSCOPY WITH PROPOFOL;  Surgeon: Danie Binder, MD;  [facility];  Service: Endoscopy;  Laterality: N/A;  9:30am  . KNEE SURGERY Left 10/2014  . KNEE SURGERY Right 09/2015  . POLYPECTOMY  02/07/2019   Procedure: POLYPECTOMY;  Surgeon: Danie Binder, MD;  [facility];  Service: Endoscopy;;   Family History  Problem Relation Age of Onset  . Heart disease Mother   . Congestive Heart Failure Mother   . Diabetes Father   . Aneurysm Sister   . Diabetes Paternal Uncle   . Diabetes Paternal Grandmother   . Colon cancer Neg Hx   . Colon polyps Neg Hx    Social History   Socioeconomic History  . Marital status: Single    Spouse name: Not on file  . Number of children: 0  . Years of education: 51  . Highest education level: 12th grade  Occupational History  . 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 Smoking status: Never Smoker  . Smokeless tobacco: Never Used  Vaping Use  . Vaping Use: Never used  Substance and Sexual Activity  . Alcohol use: No  . Drug use: No  . Sexual activity: Not Currently  Other Topics Concern  . Not on file  Social History Narrative   WORKED AT [employer] > 10 YRS. NOT  MARRIED. FUR BABIES: 3 DOGS AND 5 CATS. SPENDS FREE TIME: [place], [place], [place].   Social Determinants of Health   Financial Resource Strain: Low Risk   . Difficulty of Paying Living Expenses: Not hard at all  Food Insecurity: No Food Insecurity  . Worried About Charity fundraiser in the Last Year: Never true  . Ran Out of Food in the Last Year: Never true  Transportation Needs: No Transportation Needs  . Lack of Transportation (Medical): No  . Lack of Transportation (Non-Medical): No  Physical Activity: Sufficiently Active  . Days of Exercise per Week: 7 days  . Minutes of Exercise per Session: 40 min  Stress: No Stress Concern Present  . Feeling of Stress : Not at all  Social Connections: Moderately Integrated  . Frequency of Communication with Friends and Family:  More than three times a week  . Frequency of Social Gatherings with Friends and Family: More than three times a week  . Attends Religious Services: 1 to 4 times per year  . Active Member of Clubs or Organizations: Yes  . Attends Archivist Meetings: 1 to 4 times per year  . Marital Status: Never married    Activities of Daily Living In your present state of health, do you have any difficulty performing the following activities: 09/13/2020  Hearing? N  Vision? N  Difficulty concentrating or making decisions? N  Walking or climbing stairs? N  Dressing or bathing? N  Doing errands, shopping? N  Preparing Food and eating ? N  Using the Toilet? N  In the past six months, have you accidently leaked urine? N  Do you have problems with loss of bowel control? N  Managing your Medications? N  Managing your Finances? N  Housekeeping or managing your Housekeeping? N  Some recent data might be hidden    Patient Education/ Literacy How often do you need to have someone help you when you read instructions, pamphlets, or other written materials from your doctor or pharmacy?: 1 - Never What is the last grade  level you completed in school?: 12  Exercise Current Exercise Habits: Home exercise routine, Type of exercise: walking, Time (Minutes): 45, Frequency (Times/Week): 7, Weekly Exercise (Minutes/Week): 315, Intensity: Mild, Exercise limited by: None identified  Diet Patient reports consuming 3 meals a day and 0 snack(s) a day Patient reports that his primary diet is: Diabetic Patient reports that she does have regular access to food.   Depression Screen PHQ 2/9 Scores 09/13/2020 09/13/2019 09/08/2019 12/22/2018 05/24/2018 05/03/2018 04/15/2018  PHQ - 2 Score 0 0 0 1 0 0 0  PHQ- 9 Score - - - - - - -     Fall Risk Fall Risk  09/13/2020 09/13/2019 09/08/2019 12/22/2018 05/24/2018  Falls in the past year? 0 0 0 0 No  Number falls in past yr: - 0 - - -  Injury with Fall? - 0 - - -  Follow up - Falls prevention discussed - - -  Comment - Get rid of all throw rugs in the house, adequate lighting in the walkways and grab bars in the bathroom. - - -     Objective:  [name] seemed alert and oriented and he participated appropriately during our telephone visit.  Blood Pressure Weight BMI  BP Readings from Last 3 Encounters:  08/02/20 136/80  08/02/20 134/76  06/27/20 117/72   Wt Readings from Last 3 Encounters:  08/02/20 265 lb 3.2 oz (120.3 kg)  01/25/20 270 lb 6.4 oz (122.7 kg)  10/26/19 267 lb 3.2 oz (121.2 kg)   BMI Readings from Last 1 Encounters:  08/02/20 36.99 kg/m    *Unable to obtain current vital signs, weight, and BMI due to telephone visit type  Hearing/Vision  . Jorell did not seem to have difficulty with hearing/understanding during the telephone conversation . Reports that he has not had a formal eye exam by an eye care professional within the past year . Reports that he has not had a formal hearing evaluation within the past year *Unable to fully assess hearing and vision during telephone visit type  Cognitive Function: 6CIT Screen 09/13/2020 09/13/2019  What Year? 0  points 0 points  What month? 0 points 0 points  What time? - 0 points  Count back from 20 - 0 points  Months in  reverse - 0 points  Repeat phrase - 2 points  Total Score - 2   (Normal:0-7, Significant for Dysfunction: >8)  Normal Cognitive Function Screening: Yes   Immunization & Health Maintenance Record Immunization History  Administered Date(s) Administered  . Fluad Quad(high Dose 65+) 10/10/2019  . Influenza,inj,Quad PF,6+ Mos 09/25/2014, 10/22/2016, 09/22/2017, 09/23/2018  . Tdap 09/25/2014  . Zoster Recombinat (Shingrix) 04/20/2018    Health Maintenance  Topic Date Due  . OPHTHALMOLOGY EXAM  Never done  . COVID-19 Vaccine (1) Never done  . PNA vac Low Risk Adult (1 of 2 - PCV13) Never done  . INFLUENZA VACCINE  07/14/2020  . HEMOGLOBIN A1C  07/24/2020  . FOOT EXAM  09/07/2020  . TETANUS/TDAP  09/25/2024  . COLONOSCOPY  02/07/2029  . Hepatitis C Screening  Completed       Assessment  This is a routine wellness examination for [employer].  Health Maintenance: Due or Overdue Health Maintenance Due  Topic Date Due  . OPHTHALMOLOGY EXAM  Never done  . COVID-19 Vaccine (1) Never done  . PNA vac Low Risk Adult (1 of 2 - PCV13) Never done  . INFLUENZA VACCINE  07/14/2020  . HEMOGLOBIN A1C  07/24/2020  . FOOT EXAM  09/07/2020    [name] does not need a referral for Community Assistance: Care Management:   no Social Work:    no Prescription Assistance:  no Nutrition/Diabetes Education:  no   Plan:  Personalized Goals Goals Addressed            This Visit's Progress   . Weight (lb) < 240 lb (108.9 kg)        Personalized Health Maintenance & Screening Recommendations  Pneumococcal vaccine  Influenza vaccine Diabetes screening  Lung Cancer Screening Recommended: no (Low Dose CT Chest recommended if Age 18-80 years, 30 pack-year currently smoking OR have quit w/in past 15 years) Hepatitis C Screening recommended: no HIV Screening  recommended: no  Advanced Directives: Written information was not prepared per patient's request.  Referrals & Orders No orders of the defined types were placed in this encounter.   Follow-up Plan . Follow-up with Dettinger, Fransisca Kaufmann, MD as planned on 09/27/20 . Pt reports flu shot was given at [facility] on 08/20/20.  Marland Kitchen He is due for his Prevnar vaccine . Pt states he has an diabetic eye exam scheduled in October 2021 . No difficulties with hearing . Wears reading glasses only. . Remains independent with all ADL's . AVS printed and mailed   I have personally reviewed and noted the following in the patient's chart:   . Medical and social history . Use of alcohol, tobacco or illicit drugs  . Current medications and supplements . Functional ability and status . Nutritional status . Physical activity . Advanced directives . List of other physicians . Hospitalizations, surgeries, and ER visits in previous 12 months . Vitals . Screenings to include cognitive, depression, and falls . Referrals and appointments  In addition, I have reviewed and discussed with [name] certain preventive protocols, quality metrics, and best practice recommendations. A written personalized care plan for preventive services as well as general preventive health recommendations is available and can be mailed to the patient at his request.      Rana Snare, LPN  45/07/5928

## 2020-09-17 DIAGNOSIS — E11319 Type 2 diabetes mellitus with unspecified diabetic retinopathy without macular edema: Secondary | ICD-10-CM | POA: Diagnosis not present

## 2020-09-17 LAB — HM DIABETES EYE EXAM

## 2020-09-19 ENCOUNTER — Other Ambulatory Visit (HOSPITAL_COMMUNITY): Payer: Self-pay

## 2020-09-19 ENCOUNTER — Telehealth: Payer: Self-pay | Admitting: *Deleted

## 2020-09-19 DIAGNOSIS — E669 Obesity, unspecified: Secondary | ICD-10-CM

## 2020-09-19 DIAGNOSIS — Z148 Genetic carrier of other disease: Secondary | ICD-10-CM

## 2020-09-19 DIAGNOSIS — D751 Secondary polycythemia: Secondary | ICD-10-CM

## 2020-09-19 NOTE — Telephone Encounter (Signed)
Pt is going to [facility] tomorrow for his labwork for polycythimia. He has appt with you on 09/27/20 and wants you to order labs at his appt at [facility] tomorrow

## 2020-09-19 NOTE — Telephone Encounter (Signed)
I have placed lab orders in the computer so that they can be done for him with his blood work at [facility].

## 2020-09-20 ENCOUNTER — Inpatient Hospital Stay (HOSPITAL_COMMUNITY): Payer: Medicare Other | Attending: Hematology

## 2020-09-20 ENCOUNTER — Other Ambulatory Visit: Payer: Self-pay

## 2020-09-20 ENCOUNTER — Inpatient Hospital Stay (HOSPITAL_COMMUNITY): Payer: Medicare Other

## 2020-09-20 ENCOUNTER — Inpatient Hospital Stay (HOSPITAL_BASED_OUTPATIENT_CLINIC_OR_DEPARTMENT_OTHER): Payer: Medicare Other | Admitting: Oncology

## 2020-09-20 ENCOUNTER — Other Ambulatory Visit (HOSPITAL_COMMUNITY)
Admission: RE | Admit: 2020-09-20 | Discharge: 2020-09-20 | Disposition: A | Discharge status: Home / Self Care | Payer: Medicare Other | Source: Ambulatory Visit | Attending: Family Medicine | Admitting: Family Medicine

## 2020-09-20 VITALS — BP 127/68 | HR 58 | Temp 96.8°F | Resp 18 | Wt 267.8 lb

## 2020-09-20 DIAGNOSIS — Z7982 Long term (current) use of aspirin: Secondary | ICD-10-CM | POA: Insufficient documentation

## 2020-09-20 DIAGNOSIS — E119 Type 2 diabetes mellitus without complications: Secondary | ICD-10-CM | POA: Insufficient documentation

## 2020-09-20 DIAGNOSIS — I10 Essential (primary) hypertension: Secondary | ICD-10-CM | POA: Diagnosis not present

## 2020-09-20 DIAGNOSIS — Z7984 Long term (current) use of oral hypoglycemic drugs: Secondary | ICD-10-CM | POA: Diagnosis not present

## 2020-09-20 DIAGNOSIS — B1910 Unspecified viral hepatitis B without hepatic coma: Secondary | ICD-10-CM | POA: Insufficient documentation

## 2020-09-20 DIAGNOSIS — G4730 Sleep apnea, unspecified: Secondary | ICD-10-CM | POA: Insufficient documentation

## 2020-09-20 DIAGNOSIS — K219 Gastro-esophageal reflux disease without esophagitis: Secondary | ICD-10-CM | POA: Diagnosis not present

## 2020-09-20 DIAGNOSIS — R972 Elevated prostate specific antigen [PSA]: Secondary | ICD-10-CM | POA: Insufficient documentation

## 2020-09-20 DIAGNOSIS — E1169 Type 2 diabetes mellitus with other specified complication: Secondary | ICD-10-CM

## 2020-09-20 DIAGNOSIS — Z79899 Other long term (current) drug therapy: Secondary | ICD-10-CM | POA: Diagnosis not present

## 2020-09-20 DIAGNOSIS — D751 Secondary polycythemia: Secondary | ICD-10-CM | POA: Insufficient documentation

## 2020-09-20 DIAGNOSIS — Z148 Genetic carrier of other disease: Secondary | ICD-10-CM

## 2020-09-20 DIAGNOSIS — E669 Obesity, unspecified: Secondary | ICD-10-CM | POA: Insufficient documentation

## 2020-09-20 LAB — LIPID PANEL
Cholesterol: 132 mg/dL (ref 0–200)
HDL: 32 mg/dL — ABNORMAL LOW (ref >40)
LDL Cholesterol: 85 mg/dL (ref 0–99)
Total CHOL/HDL Ratio: 4.1 RATIO
Triglycerides: 77 mg/dL (ref <150)
VLDL: 15 mg/dL (ref 0–40)

## 2020-09-20 LAB — COMPREHENSIVE METABOLIC PANEL
ALT: 27 U/L (ref 0–44)
AST: 25 U/L (ref 15–41)
Albumin: 4 g/dL (ref 3.5–5.0)
Alkaline Phosphatase: 64 U/L (ref 38–126)
Anion gap: 10 (ref 5–15)
BUN: 20 mg/dL (ref 8–23)
CO2: 21 mmol/L — ABNORMAL LOW (ref 22–32)
Calcium: 9 mg/dL (ref 8.9–10.3)
Chloride: 100 mmol/L (ref 98–111)
Creatinine, Ser: 1.17 mg/dL (ref 0.61–1.24)
GFR calc non Af Amer: >60 mL/min (ref >60)
Glucose, Bld: 197 mg/dL — ABNORMAL HIGH (ref 70–99)
Potassium: 4.1 mmol/L (ref 3.5–5.1)
Sodium: 131 mmol/L — ABNORMAL LOW (ref 135–145)
Total Bilirubin: 0.9 mg/dL (ref 0.3–1.2)
Total Protein: 7.5 g/dL (ref 6.5–8.1)

## 2020-09-20 LAB — CBC WITH DIFFERENTIAL/PLATELET
Abs Immature Granulocytes: 0.02 10*3/uL (ref 0.00–0.07)
Basophils Absolute: 0.1 10*3/uL (ref 0.0–0.1)
Basophils Relative: 1 %
Eosinophils Absolute: 0.2 10*3/uL (ref 0.0–0.5)
Eosinophils Relative: 3 %
HCT: 41.4 % (ref 39.0–52.0)
Hemoglobin: 13.2 g/dL (ref 13.0–17.0)
Immature Granulocytes: 0 %
Lymphocytes Relative: 23 %
Lymphs Abs: 1.4 10*3/uL (ref 0.7–4.0)
MCH: 25.2 pg — ABNORMAL LOW (ref 26.0–34.0)
MCHC: 31.9 g/dL (ref 30.0–36.0)
MCV: 79.2 fL — ABNORMAL LOW (ref 80.0–100.0)
Monocytes Absolute: 0.5 10*3/uL (ref 0.1–1.0)
Monocytes Relative: 9 %
Neutro Abs: 3.9 10*3/uL (ref 1.7–7.7)
Neutrophils Relative %: 64 %
Platelets: 283 10*3/uL (ref 150–400)
RBC: 5.23 MIL/uL (ref 4.22–5.81)
RDW: 15.3 % (ref 11.5–15.5)
WBC: 6 10*3/uL (ref 4.0–10.5)
nRBC: 0 % (ref 0.0–0.2)

## 2020-09-20 LAB — VITAMIN B12: Vitamin B-12: 252 pg/mL (ref 180–914)

## 2020-09-20 LAB — VITAMIN D 25 HYDROXY (VIT D DEFICIENCY, FRACTURES): Vit D, 25-Hydroxy: 36.36 ng/mL (ref 30–100)

## 2020-09-20 NOTE — Progress Notes (Signed)
[name] presents today for phlebotomy per MD orders. HGB 13.2 and HCT 41.4. Phlebotomy procedure started at 09:54am and ended at 10:00am. 500 cc removed. Patient tolerated procedure well. IV needle removed intact.  Vital signs stable. No complaints at this time. Discharged from clinic ambulatory in stable condition. Alert and oriented x 3. F/U with [facility] as scheduled.

## 2020-09-20 NOTE — Patient Instructions (Signed)
[facility]  Discharge Instructions:   _______________________________________________________________  Thank you for choosing [facility] at [facility] to provide your oncology and hematology care.  To afford each patient quality time with our providers, please arrive at least 15 minutes before your scheduled appointment.  You need to re-schedule your appointment if you arrive 10 or more minutes late.  We strive to give you quality time with our providers, and arriving late affects you and other patients whose appointments are after yours.  Also, if you no show three or more times for appointments you may be dismissed from the clinic.  Again, thank you for choosing [facility] at [facility]. Our hope is that these requests will allow you access to exceptional care and in a timely manner. _______________________________________________________________  If you have questions after your visit, please contact our office at [phone] between the hours of 8:30 a.m. and 5:00 p.m. Voicemails left after 4:30 p.m. will not be returned until the following business day. _______________________________________________________________  For prescription refill requests, have your pharmacy contact our office. _______________________________________________________________  Recommendations made by the consultant and any test results will be sent to your referring physician. _______________________________________________________________ 

## 2020-09-20 NOTE — Progress Notes (Signed)
[name], [place] [id number]   CLINIC:  Medical Oncology/Hematology  PCP:  Dettinger, Fransisca Kaufmann, MD [place] [address] [phone]   REASON FOR VISIT: Follow-up for secondary polycythemia   CURRENT THERAPY: Intermittent phlebotomies    INTERVAL HISTORY:  [name] 69 y.o. male returns for routine follow-up for secondary polycythemia.  Reports feeling hot and sweaty around the time he is due for his next colostomy.  Feels well today.  Has insomnia intermittently.  Appetite is great.  Reports he cooks most of his food at home secondary to being a diabetic.  Denies any nausea, vomiting, or diarrhea. Denies any new pains. Had not noticed any recent bleeding such as epistaxis, hematuria or hematochezia. Denies recent chest pain on exertion, shortness of breath on minimal exertion, pre-syncopal episodes, or palpitations. Denies any numbness or tingling in hands or feet. Denies any recent fevers, infections, or recent hospitalizations.     REVIEW OF SYSTEMS:  Review of Systems  Constitutional: Positive for fatigue.       Hot and sweaty and thirsty  Psychiatric/Behavioral: Positive for sleep disturbance.     PAST MEDICAL/SURGICAL HISTORY:  Past Medical History:  Diagnosis Date  . Diabetes mellitus without complication ([place])   . GERD (gastroesophageal reflux disease)   . Hx of [place] spotted fever [id number]  . Hypertension   . Sleep apnea    wears cpap    Past Surgical History:  Procedure Laterality Date  . COLONOSCOPY  09/2012   Dr. Britta Mccreedy. Diverticulosis, descending adenomatous colon polyp, hyperplastic rectal polyp. ?mass in anal canal evaluated by surgery and consistent with internal hemorrhoid  . COLONOSCOPY WITH PROPOFOL N/A 02/07/2019   Procedure: COLONOSCOPY WITH PROPOFOL;  Surgeon: Danie Binder, MD;  [facility];  Service: Endoscopy;  Laterality: N/A;  9:30am  . KNEE SURGERY Left 10/2014   . KNEE SURGERY Right 09/2015  . POLYPECTOMY  02/07/2019   Procedure: POLYPECTOMY;  Surgeon: Danie Binder, MD;  Location: [facility];  Service: Endoscopy;;     SOCIAL HISTORY:  Social History   Socioeconomic History  . Marital status: Single    Spouse name: Not on file  . Number of children: 0  . Years of education: 65  . Highest education level: 12th grade  Occupational History  . 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 Smoking status: Never Smoker  . Smokeless tobacco: Never Used  Vaping Use  . Vaping Use: Never used  Substance and Sexual Activity  . Alcohol use: No  . Drug use: No  . Sexual activity: Not Currently  Other Topics Concern  . Not on file  Social History Narrative   WORKED AT [employer] > 10 YRS. NOT MARRIED. FUR BABIES: 3 DOGS AND 5 CATS. SPENDS FREE TIME: [place], [place].   Social Determinants of Health   Financial Resource Strain: Low Risk   . Difficulty of Paying Living Expenses: Not hard at all  Food Insecurity: No Food Insecurity  . Worried About Charity fundraiser in the Last Year: Never true  . Ran Out of Food in the Last Year: Never true  Transportation Needs: No Transportation Needs  . Lack of Transportation (Medical): No  . Lack of Transportation (Non-Medical): No  Physical Activity: Sufficiently Active  . Days of Exercise per Week: 7 days  . Minutes of Exercise per Session: 40 min  Stress: No Stress Concern Present  . Feeling of Stress : Not at all  Social Connections: Moderately Integrated  . Frequency of Communication with Friends and Family: More than three times a week  . Frequency of Social Gatherings with Friends and Family: More than three times a week  . Attends Religious Services: 1 to 4 times per year  . Active Member of Clubs or Organizations: Yes  . Attends Archivist Meetings: 1 to 4 times per year  . Marital Status: Never married  Intimate Partner Violence:  Not At Risk  . Fear of Current or Ex-Partner: No  . Emotionally Abused: No  . Physically Abused: No  . Sexually Abused: No    FAMILY HISTORY:  Family History  Problem Relation Age of Onset  . Heart disease Mother   . Congestive Heart Failure Mother   . Diabetes Father   . Aneurysm Sister   . Diabetes Paternal Uncle   . Diabetes Paternal Grandmother   . Colon cancer Neg Hx   . Colon polyps Neg Hx     CURRENT MEDICATIONS:  Outpatient Encounter Medications as of 09/20/2020  Medication Sig  . aspirin EC 81 MG tablet Take 81 mg by mouth every evening.  . gabapentin (NEURONTIN) 100 MG capsule Take 2 capsules (200 mg total) by mouth at bedtime. (Needs to be seen before next refill)  . glucose blood test strip Use to check blood glucose once daily at varying times.  Dispense Verio One Touch Test strips  . irbesartan (AVAPRO) 150 MG tablet Take 1 tablet (150 mg total) by mouth daily.  . metFORMIN (GLUCOPHAGE) 1000 MG tablet Take 1 tablet (1,000 mg total) by mouth 2 (two) times daily with a meal. (Needs to be seen before next refill)  . Multiple Vitamins-Minerals (CENTRUM SILVER 50+MEN PO) Take 1 tablet by mouth daily. Every now and then  . ONETOUCH DELICA LANCETS 18A MISC Use to check blood glucose once daily at varying times  . Probiotic Product (PROBIOTIC PO) Take 1 capsule by mouth 3 (three) times a week.   . tamsulosin (FLOMAX) 0.4 MG CAPS capsule Take 0.4 mg by mouth daily.  . [DISCONTINUED] amoxicillin-clavulanate (AUGMENTIN) 875-125 MG tablet Take 1 tablet by mouth 2 (two) times daily.   No facility-administered encounter medications on file as of 09/20/2020.    ALLERGIES:  Allergies  Allergen Reactions  . Ramipril Cough     PHYSICAL EXAM:  ECOG Performance status: 1  Vitals:   09/20/20 0946  BP: 127/68  Pulse: (!) 58  Resp: 18  Temp: (!) 96.8 F (36 C)  SpO2: 97%   Filed Weights   09/20/20 0946  Weight: 267 lb 12.8 oz (121.5 kg)   Physical  Exam Constitutional:      Appearance: Normal appearance. He is normal weight.  Cardiovascular:     Rate and Rhythm: Normal rate and regular rhythm.     Heart sounds: Normal heart sounds.  Pulmonary:     Effort: Pulmonary effort is normal.     Breath sounds: Normal breath sounds.  Abdominal:     General: Bowel sounds are normal.     Palpations: Abdomen is soft.  Musculoskeletal:        General: Normal range of motion.  Skin:    General: Skin is warm.  Neurological:     Mental Status: He is alert and oriented to person, place, and time. Mental status is at baseline.  Psychiatric:        Mood  and Affect: Mood normal.        Behavior: Behavior normal.        Thought Content: Thought content normal.        Judgment: Judgment normal.      LABORATORY DATA:  I have reviewed the labs as listed.  CBC    Component Value Date/Time   WBC 6.0 09/20/2020 0921   RBC 5.23 09/20/2020 0921   HGB 13.2 09/20/2020 0921   HGB 18.5 (H) 12/23/2018 0804   HCT 41.4 09/20/2020 0921   HCT 53.3 (H) 12/23/2018 0804   PLT 283 09/20/2020 0921   PLT 246 12/23/2018 0804   MCV 79.2 (L) 09/20/2020 0921   MCV 95 12/23/2018 0804   MCH 25.2 (L) 09/20/2020 0921   MCHC 31.9 09/20/2020 0921   RDW 15.3 09/20/2020 0921   RDW 12.6 12/23/2018 0804   LYMPHSABS 1.4 09/20/2020 0921   LYMPHSABS 1.4 12/23/2018 0804   MONOABS 0.5 09/20/2020 0921   EOSABS 0.2 09/20/2020 0921   EOSABS 0.1 12/23/2018 0804   BASOSABS 0.1 09/20/2020 0921   BASOSABS 0.1 12/23/2018 0804   CMP Latest Ref Rng & Units 09/20/2020 08/02/2020 05/09/2020  Glucose 70 - 99 mg/dL 197(H) 206(H) 132(H)  BUN 8 - 23 mg/dL 20 19 22   Creatinine 0.61 - 1.24 mg/dL 1.17 1.26(H) 1.31(H)  Sodium 135 - 145 mmol/L 131(L) 138 139  Potassium 3.5 - 5.1 mmol/L 4.1 4.3 4.3  Chloride 98 - 111 mmol/L 100 105 105  CO2 22 - 32 mmol/L 21(L) 24 22  Calcium 8.9 - 10.3 mg/dL 9.0 9.2 9.4  Total Protein 6.5 - 8.1 g/dL 7.5 7.6 7.3  Total Bilirubin 0.3 - 1.2 mg/dL 0.9  1.1 1.1  Alkaline Phos 38 - 126 U/L 64 73 74  AST 15 - 41 U/L 25 21 26   ALT 0 - 44 U/L 27 20 24     All questions were answered to patient's stated satisfaction. Encouraged patient to call with any new concerns or questions before his next visit to the cancer center and we can certain see him sooner, if needed.     ASSESSMENT & PLAN:  No problem-specific Assessment & Plan notes found for this encounter. 1.  Secondary polycythemia: - Patient reports he clotted when he would go to donate blood.  He was always hot and very thirsty.  PCP noticed his hematocrit and hemoglobin were elevated and sent him to hematology. - His blood work was evaluated he was negative for HIV, Jak 2 and BCR/ABL.  EPO level was normal at 10.  SPEP, ANA, CRP, RF were all normal. - Hemochromatosis gene evaluation showed he had a single C282Y gene and is a carrier for hemochromatosis. - He also reports he has sleep apnea and uses a CPAP machine at night.  He was referred for another sleep study evaluation.  But they canceled his appointment due to the COVID crisis. -Patient denies smoking history.  Denies any headaches or blurred vision.  Denies any skin changes. - He has been averaging a phlebotomy every 6 weeks. -Last 2 lab draws have revealed stable hemoglobins ( around 12-13) and a hematocrit around 41. --Goal hematocrit is less than 45. --Proceed with phlebotomy today.  --We can try to push his phlebotomies out from every 6 weeks to every 8 weeks. -Return to clinic in 8 weeks for lab work and phl all ebotomy.  If symptoms worsen before this appointment, patient is to call the clinic.   2.  Sleep apnea: - We  have discussed with the patient that obstructive sleep apnea may cause secondary polycythemia. - Patient has been referred for a sleep study evaluation. - The appointment was canceled due to the [place] crisis we will reschedule after. -Patient reports he uses a CPAP machine at night  3.  Positive hep B  serology: - Patient has positive hep B surface antibody and positive hep B core antibody. Surface antigen and IgM are negative.  Hep C and HIV are negative.  Patient is immune based on prior exposure.  4.  Elevated PSA: -Patient has a PSA of 9.2 -He is followed by advanced urology. -He had a prostate biopsy on 03/30/2017 with 12 core submitted that were benign. -Patient will continue to follow-up with urology.  5.  Health maintenance: -Patient had a colonoscopy on 02/07/2019 that showed a 5 mm polyp in the ascending colon that showed tubular adenoma.  Diverticulosis in the rectosigmoid colon and the ascending colon.  Torturous colon.  External and internal hemorrhoids. -Patient will follow-up with GI as recommended.   Greater than 50% was spent in counseling and coordination of care with this patient including but not limited to discussion of the relevant topics above (See A&P) including, but not limited to diagnosis and management of acute and chronic medical conditions.     Orders placed this encounter:  No orders of the defined types were placed in this encounter.  Timothy Casa, NP 09/20/2020 2:27 PM  [place] [phone]

## 2020-09-27 ENCOUNTER — Encounter: Payer: Self-pay | Admitting: Family Medicine

## 2020-09-27 ENCOUNTER — Other Ambulatory Visit: Payer: Self-pay

## 2020-09-27 ENCOUNTER — Ambulatory Visit (INDEPENDENT_AMBULATORY_CARE_PROVIDER_SITE_OTHER): Payer: Medicare Other | Admitting: Family Medicine

## 2020-09-27 VITALS — BP 141/78 | HR 63 | Temp 98.0°F | Ht 71.0 in | Wt 266.0 lb

## 2020-09-27 DIAGNOSIS — I152 Hypertension secondary to endocrine disorders: Secondary | ICD-10-CM

## 2020-09-27 DIAGNOSIS — E1159 Type 2 diabetes mellitus with other circulatory complications: Secondary | ICD-10-CM

## 2020-09-27 DIAGNOSIS — Z23 Encounter for immunization: Secondary | ICD-10-CM | POA: Diagnosis not present

## 2020-09-27 DIAGNOSIS — R972 Elevated prostate specific antigen [PSA]: Secondary | ICD-10-CM | POA: Diagnosis not present

## 2020-09-27 DIAGNOSIS — E669 Obesity, unspecified: Secondary | ICD-10-CM | POA: Diagnosis not present

## 2020-09-27 DIAGNOSIS — E1169 Type 2 diabetes mellitus with other specified complication: Secondary | ICD-10-CM

## 2020-09-27 LAB — BAYER DCA HB A1C WAIVED: HB A1C (BAYER DCA - WAIVED): 8.7 % — ABNORMAL HIGH (ref <7.0)

## 2020-09-27 MED ORDER — TAMSULOSIN HCL 0.4 MG PO CAPS
0.4000 mg | ORAL_CAPSULE | Freq: Every day | ORAL | 3 refills | Status: DC
Start: 2020-09-27 — End: 2020-09-30

## 2020-09-27 MED ORDER — EMPAGLIFLOZIN 10 MG PO TABS: 10.0000 mg | ORAL_TABLET | Freq: Every day | ORAL | 1 refills | Status: DC

## 2020-09-27 NOTE — Addendum Note (Signed)
Addended by: Karle Plumber on: 09/27/2020 04:27 PM   Modules accepted: Orders

## 2020-09-27 NOTE — Progress Notes (Signed)
BP (!) 141/78    Pulse 63    Temp 98 F (36.7 C)    Ht 5\' 11"  (1.803 m)    Wt 266 lb (120.7 kg)    SpO2 95%    BMI 37.10 kg/m    Subjective:   Patient ID: [name], male    DOB: 1951-04-16, 69 y.o.   MRN: [id number]  HPI: [name] is a 69 y.o. male presenting on 09/27/2020 for Medical Management of Chronic Issues, Hypertension, and Diabetes   HPI Type 2 diabetes mellitus Patient comes in today for recheck of his diabetes. Patient has been currently taking Metformin. Patient is currently on an ACE inhibitor/ARB. Patient has seen an ophthalmologist this year. Patient denies any new issues with their feet. The symptom started onset as an adult neuropathy and hypertension and sleep apnea ARE RELATED TO DM   Hypertension Patient is currently on irbesartan, and their blood pressure today is 141/78. Patient denies any lightheadedness or dizziness. Patient denies headaches, blurred vision, chest pains, shortness of breath, or weakness. Denies any side effects from medication and is content with current medication.   Relevant past medical, surgical, family and social history reviewed and updated as indicated. Interim medical history since our last visit reviewed. Allergies and medications reviewed and updated.  Review of Systems  Constitutional: Negative for chills and fever.  Eyes: Negative for visual disturbance.  Respiratory: Negative for shortness of breath and wheezing.   Cardiovascular: Negative for chest pain and leg swelling.  Musculoskeletal: Negative for back pain and gait problem.  Skin: Negative for rash.  Neurological: Negative for dizziness, weakness and numbness.  All other systems reviewed and are negative.   Per HPI unless specifically indicated above   Allergies as of 09/27/2020      Reactions   Ramipril Cough      Medication List       Accurate as of September 27, 2020  9:51 AM. If you have any questions, ask your nurse or doctor.          aspirin EC 81 MG tablet Take 81 mg by mouth every evening.   CENTRUM SILVER 50+MEN PO Take 1 tablet by mouth daily. Every now and then   gabapentin 100 MG capsule Commonly known as: NEURONTIN Take 2 capsules (200 mg total) by mouth at bedtime. (Needs to be seen before next refill)   glucose blood test strip Use to check blood glucose once daily at varying times.  Dispense Verio One Touch Test strips   irbesartan 150 MG tablet Commonly known as: AVAPRO Take 1 tablet (150 mg total) by mouth daily.   metFORMIN 1000 MG tablet Commonly known as: GLUCOPHAGE Take 1 tablet (1,000 mg total) by mouth 2 (two) times daily with a meal. (Needs to be seen before next refill)   OneTouch Delica Lancets 53G Misc Use to check blood glucose once daily at varying times   PROBIOTIC PO Take 1 capsule by mouth 3 (three) times a week.   tamsulosin 0.4 MG Caps capsule Commonly known as: FLOMAX Take 1 capsule (0.4 mg total) by mouth daily after supper. What changed: when to take this Changed by: Fransisca Kaufmann Sylwia Cuervo, MD        Objective:   BP (!) 141/78    Pulse 63    Temp 98 F (36.7 C)    Ht 5\' 11"  (1.803 m)    Wt 266 lb (120.7 kg)    SpO2 95%    BMI 37.10 kg/m  Wt Readings from Last 3 Encounters:  09/27/20 266 lb (120.7 kg)  09/20/20 267 lb 12.8 oz (121.5 kg)  08/02/20 265 lb 3.2 oz (120.3 kg)    Physical Exam Vitals and nursing note reviewed.  Constitutional:      General: He is not in acute distress.    Appearance: He is well-developed. He is not diaphoretic.  Eyes:     General: No scleral icterus.    Conjunctiva/sclera: Conjunctivae normal.  Neck:     Thyroid: No thyromegaly.  Cardiovascular:     Rate and Rhythm: Normal rate and regular rhythm.     Heart sounds: Normal heart sounds. No murmur heard.   Pulmonary:     Effort: Pulmonary effort is normal. No respiratory distress.     Breath sounds: Normal breath sounds. No wheezing.  Musculoskeletal:        General: Normal  range of motion.     Cervical back: Neck supple.  Lymphadenopathy:     Cervical: No cervical adenopathy.  Skin:    General: Skin is warm and dry.     Findings: No rash.  Neurological:     Mental Status: He is alert and oriented to person, place, and time.     Coordination: Coordination normal.  Psychiatric:        Behavior: Behavior normal.       Assessment & Plan:   Problem List Items Addressed This Visit      Cardiovascular and Mediastinum   Hypertension associated with diabetes ([place])     Endocrine   Diabetes mellitus type 2 in obese ([facility]) - Primary   Relevant Orders   Bayer DCA Hb A1c Waived     Other   Elevated PSA    Other Visit Diagnoses    Need for vaccination against Streptococcus pneumoniae using pneumococcal conjugate vaccine 13       Relevant Orders   Pneumococcal conjugate vaccine 13-valent (Completed)      Will check PSA today, blood work that he had a couple weeks ago looks good. Follow up plan: Return in about 3 months (around 12/28/2020), or if symptoms worsen or fail to improve, for Diabetes recheck in 3 months, he will schedule an appointment for skin tag sooner when he can..  Counseling provided for all of the vaccine components Orders Placed This Encounter  Procedures   Pneumococcal conjugate vaccine 13-valent   Bayer DCA Hb A1c [place], MD [facility] 09/27/2020, 9:51 AM

## 2020-09-30 MED ORDER — TAMSULOSIN HCL 0.4 MG PO CAPS
0.4000 mg | ORAL_CAPSULE | Freq: Every day | ORAL | 3 refills | Status: DC
Start: 2020-09-30 — End: 2020-12-30

## 2020-09-30 NOTE — Addendum Note (Signed)
Addended by: Antonietta Barcelona D on: 09/30/2020 09:10 AM   Modules accepted: Orders

## 2020-10-01 ENCOUNTER — Encounter: Payer: Self-pay | Admitting: Family Medicine

## 2020-10-09 ENCOUNTER — Other Ambulatory Visit: Payer: Self-pay | Admitting: Family Medicine

## 2020-10-09 DIAGNOSIS — E1169 Type 2 diabetes mellitus with other specified complication: Secondary | ICD-10-CM

## 2020-10-18 ENCOUNTER — Other Ambulatory Visit: Payer: Self-pay | Admitting: Family Medicine

## 2020-10-21 ENCOUNTER — Ambulatory Visit: Payer: 59 | Admitting: Family Medicine

## 2020-11-07 ENCOUNTER — Other Ambulatory Visit: Payer: Self-pay | Admitting: Family Medicine

## 2020-11-07 DIAGNOSIS — E1169 Type 2 diabetes mellitus with other specified complication: Secondary | ICD-10-CM

## 2020-11-07 DIAGNOSIS — E669 Obesity, unspecified: Secondary | ICD-10-CM

## 2020-11-12 ENCOUNTER — Ambulatory Visit (HOSPITAL_COMMUNITY): Payer: Medicare Other | Admitting: Oncology

## 2020-11-12 ENCOUNTER — Encounter (HOSPITAL_COMMUNITY): Payer: Medicare Other

## 2020-11-12 ENCOUNTER — Other Ambulatory Visit (HOSPITAL_COMMUNITY): Payer: Medicare Other

## 2020-11-15 ENCOUNTER — Encounter (HOSPITAL_COMMUNITY): Payer: Medicare Other

## 2020-11-15 ENCOUNTER — Other Ambulatory Visit (HOSPITAL_COMMUNITY): Payer: Medicare Other

## 2020-11-15 ENCOUNTER — Ambulatory Visit (HOSPITAL_COMMUNITY): Payer: Medicare Other | Admitting: Oncology

## 2020-11-18 ENCOUNTER — Other Ambulatory Visit (HOSPITAL_COMMUNITY): Payer: Medicare Other

## 2020-11-18 ENCOUNTER — Other Ambulatory Visit (HOSPITAL_COMMUNITY): Payer: Self-pay

## 2020-11-18 ENCOUNTER — Ambulatory Visit (HOSPITAL_COMMUNITY): Payer: Medicare Other | Admitting: Hematology

## 2020-11-18 DIAGNOSIS — D751 Secondary polycythemia: Secondary | ICD-10-CM

## 2020-11-19 ENCOUNTER — Inpatient Hospital Stay (HOSPITAL_COMMUNITY): Payer: Medicare Other | Admitting: Oncology

## 2020-11-19 ENCOUNTER — Inpatient Hospital Stay (HOSPITAL_COMMUNITY): Payer: Medicare Other | Attending: Hematology and Oncology

## 2020-11-19 ENCOUNTER — Other Ambulatory Visit: Payer: Self-pay

## 2020-11-19 DIAGNOSIS — Z8619 Personal history of other infectious and parasitic diseases: Secondary | ICD-10-CM | POA: Diagnosis not present

## 2020-11-19 DIAGNOSIS — Z148 Genetic carrier of other disease: Secondary | ICD-10-CM | POA: Diagnosis not present

## 2020-11-19 DIAGNOSIS — R972 Elevated prostate specific antigen [PSA]: Secondary | ICD-10-CM | POA: Diagnosis not present

## 2020-11-19 DIAGNOSIS — G4730 Sleep apnea, unspecified: Secondary | ICD-10-CM | POA: Insufficient documentation

## 2020-11-19 DIAGNOSIS — D751 Secondary polycythemia: Secondary | ICD-10-CM | POA: Insufficient documentation

## 2020-11-19 LAB — COMPREHENSIVE METABOLIC PANEL
ALT: 25 U/L (ref 0–44)
AST: 24 U/L (ref 15–41)
Albumin: 3.7 g/dL (ref 3.5–5.0)
Alkaline Phosphatase: 94 U/L (ref 38–126)
Anion gap: 7 (ref 5–15)
BUN: 20 mg/dL (ref 8–23)
CO2: 23 mmol/L (ref 22–32)
Calcium: 8.8 mg/dL — ABNORMAL LOW (ref 8.9–10.3)
Chloride: 102 mmol/L (ref 98–111)
Creatinine, Ser: 1.15 mg/dL (ref 0.61–1.24)
GFR, Estimated: >60 mL/min (ref >60)
Glucose, Bld: 252 mg/dL — ABNORMAL HIGH (ref 70–99)
Potassium: 4.2 mmol/L (ref 3.5–5.1)
Sodium: 132 mmol/L — ABNORMAL LOW (ref 135–145)
Total Bilirubin: 0.7 mg/dL (ref 0.3–1.2)
Total Protein: 7 g/dL (ref 6.5–8.1)

## 2020-11-19 LAB — CBC WITH DIFFERENTIAL/PLATELET
Abs Immature Granulocytes: 0.03 10*3/uL (ref 0.00–0.07)
Basophils Absolute: 0.1 10*3/uL (ref 0.0–0.1)
Basophils Relative: 1 %
Eosinophils Absolute: 0.2 10*3/uL (ref 0.0–0.5)
Eosinophils Relative: 4 %
HCT: 40 % (ref 39.0–52.0)
Hemoglobin: 12.3 g/dL — ABNORMAL LOW (ref 13.0–17.0)
Immature Granulocytes: 1 %
Lymphocytes Relative: 28 %
Lymphs Abs: 1.3 10*3/uL (ref 0.7–4.0)
MCH: 24.7 pg — ABNORMAL LOW (ref 26.0–34.0)
MCHC: 30.8 g/dL (ref 30.0–36.0)
MCV: 80.3 fL (ref 80.0–100.0)
Monocytes Absolute: 0.4 10*3/uL (ref 0.1–1.0)
Monocytes Relative: 7 %
Neutro Abs: 2.8 10*3/uL (ref 1.7–7.7)
Neutrophils Relative %: 59 %
Platelets: 279 10*3/uL (ref 150–400)
RBC: 4.98 MIL/uL (ref 4.22–5.81)
RDW: 15.8 % — ABNORMAL HIGH (ref 11.5–15.5)
WBC: 4.8 10*3/uL (ref 4.0–10.5)
nRBC: 0 % (ref 0.0–0.2)

## 2020-11-19 LAB — FERRITIN: Ferritin: 8 ng/mL — ABNORMAL LOW (ref 24–336)

## 2020-11-19 LAB — IRON AND TIBC
Iron: 20 ug/dL — ABNORMAL LOW (ref 45–182)
Saturation Ratios: 5 % — ABNORMAL LOW (ref 17.9–39.5)
TIBC: 444 ug/dL (ref 250–450)
UIBC: 424 ug/dL

## 2020-11-25 ENCOUNTER — Ambulatory Visit: Payer: 59 | Admitting: Family Medicine

## 2020-11-25 ENCOUNTER — Encounter (HOSPITAL_COMMUNITY): Payer: Medicare Other

## 2020-12-08 ENCOUNTER — Other Ambulatory Visit: Payer: Self-pay | Admitting: Family Medicine

## 2020-12-08 DIAGNOSIS — E1169 Type 2 diabetes mellitus with other specified complication: Secondary | ICD-10-CM

## 2020-12-26 ENCOUNTER — Other Ambulatory Visit (HOSPITAL_COMMUNITY): Payer: Self-pay | Admitting: *Deleted

## 2020-12-26 DIAGNOSIS — D751 Secondary polycythemia: Secondary | ICD-10-CM

## 2020-12-27 ENCOUNTER — Other Ambulatory Visit: Payer: Self-pay

## 2020-12-27 ENCOUNTER — Inpatient Hospital Stay (HOSPITAL_COMMUNITY): Payer: Medicare Other | Attending: Hematology

## 2020-12-27 DIAGNOSIS — R972 Elevated prostate specific antigen [PSA]: Secondary | ICD-10-CM | POA: Insufficient documentation

## 2020-12-27 DIAGNOSIS — R682 Dry mouth, unspecified: Secondary | ICD-10-CM | POA: Diagnosis not present

## 2020-12-27 DIAGNOSIS — I10 Essential (primary) hypertension: Secondary | ICD-10-CM | POA: Insufficient documentation

## 2020-12-27 DIAGNOSIS — D751 Secondary polycythemia: Secondary | ICD-10-CM

## 2020-12-27 DIAGNOSIS — E119 Type 2 diabetes mellitus without complications: Secondary | ICD-10-CM | POA: Insufficient documentation

## 2020-12-27 DIAGNOSIS — Z7984 Long term (current) use of oral hypoglycemic drugs: Secondary | ICD-10-CM | POA: Diagnosis not present

## 2020-12-27 DIAGNOSIS — K219 Gastro-esophageal reflux disease without esophagitis: Secondary | ICD-10-CM | POA: Diagnosis not present

## 2020-12-27 DIAGNOSIS — Z7982 Long term (current) use of aspirin: Secondary | ICD-10-CM | POA: Diagnosis not present

## 2020-12-27 DIAGNOSIS — G4730 Sleep apnea, unspecified: Secondary | ICD-10-CM | POA: Diagnosis not present

## 2020-12-27 LAB — CBC WITH DIFFERENTIAL/PLATELET
Abs Immature Granulocytes: 0.01 10*3/uL (ref 0.00–0.07)
Basophils Absolute: 0.1 10*3/uL (ref 0.0–0.1)
Basophils Relative: 2 %
Eosinophils Absolute: 0.2 10*3/uL (ref 0.0–0.5)
Eosinophils Relative: 5 %
HCT: 43 % (ref 39.0–52.0)
Hemoglobin: 13.3 g/dL (ref 13.0–17.0)
Immature Granulocytes: 0 %
Lymphocytes Relative: 28 %
Lymphs Abs: 1.3 10*3/uL (ref 0.7–4.0)
MCH: 24.6 pg — ABNORMAL LOW (ref 26.0–34.0)
MCHC: 30.9 g/dL (ref 30.0–36.0)
MCV: 79.5 fL — ABNORMAL LOW (ref 80.0–100.0)
Monocytes Absolute: 0.5 10*3/uL (ref 0.1–1.0)
Monocytes Relative: 10 %
Neutro Abs: 2.7 10*3/uL (ref 1.7–7.7)
Neutrophils Relative %: 55 %
Platelets: 284 10*3/uL (ref 150–400)
RBC: 5.41 MIL/uL (ref 4.22–5.81)
RDW: 16 % — ABNORMAL HIGH (ref 11.5–15.5)
WBC: 4.8 10*3/uL (ref 4.0–10.5)
nRBC: 0 % (ref 0.0–0.2)

## 2020-12-27 LAB — COMPREHENSIVE METABOLIC PANEL
ALT: 22 U/L (ref 0–44)
AST: 22 U/L (ref 15–41)
Albumin: 3.8 g/dL (ref 3.5–5.0)
Alkaline Phosphatase: 76 U/L (ref 38–126)
Anion gap: 9 (ref 5–15)
BUN: 24 mg/dL — ABNORMAL HIGH (ref 8–23)
CO2: 23 mmol/L (ref 22–32)
Calcium: 9.3 mg/dL (ref 8.9–10.3)
Chloride: 103 mmol/L (ref 98–111)
Creatinine, Ser: 1.24 mg/dL (ref 0.61–1.24)
GFR, Estimated: >60 mL/min (ref >60)
Glucose, Bld: 319 mg/dL — ABNORMAL HIGH (ref 70–99)
Potassium: 4.4 mmol/L (ref 3.5–5.1)
Sodium: 135 mmol/L (ref 135–145)
Total Bilirubin: 0.7 mg/dL (ref 0.3–1.2)
Total Protein: 7.4 g/dL (ref 6.5–8.1)

## 2020-12-30 ENCOUNTER — Encounter: Payer: Self-pay | Admitting: Family Medicine

## 2020-12-30 ENCOUNTER — Ambulatory Visit (INDEPENDENT_AMBULATORY_CARE_PROVIDER_SITE_OTHER): Payer: Medicare Other | Admitting: Family Medicine

## 2020-12-30 ENCOUNTER — Other Ambulatory Visit: Payer: Self-pay

## 2020-12-30 DIAGNOSIS — E1169 Type 2 diabetes mellitus with other specified complication: Secondary | ICD-10-CM

## 2020-12-30 DIAGNOSIS — E1159 Type 2 diabetes mellitus with other circulatory complications: Secondary | ICD-10-CM

## 2020-12-30 DIAGNOSIS — I152 Hypertension secondary to endocrine disorders: Secondary | ICD-10-CM

## 2020-12-30 DIAGNOSIS — E669 Obesity, unspecified: Secondary | ICD-10-CM

## 2020-12-30 MED ORDER — METFORMIN HCL 1000 MG PO TABS: 1000.0000 mg | ORAL_TABLET | Freq: Two times a day (BID) | ORAL | 3 refills | Status: DC

## 2020-12-30 MED ORDER — GABAPENTIN 100 MG PO CAPS: 200.0000 mg | ORAL_CAPSULE | Freq: Every day | ORAL | 3 refills | Status: DC

## 2020-12-30 MED ORDER — IRBESARTAN 150 MG PO TABS: 150.0000 mg | ORAL_TABLET | Freq: Every day | ORAL | 3 refills | Status: DC

## 2020-12-30 MED ORDER — EMPAGLIFLOZIN 10 MG PO TABS: 10.0000 mg | ORAL_TABLET | Freq: Every day | ORAL | 5 refills | Status: DC

## 2020-12-30 MED ORDER — TAMSULOSIN HCL 0.4 MG PO CAPS
0.4000 mg | ORAL_CAPSULE | Freq: Every day | ORAL | 3 refills | Status: DC
Start: 2020-12-30 — End: 2022-02-06

## 2020-12-30 NOTE — Progress Notes (Signed)
Virtual Visit via telephone Note  I connected with [name] on 12/30/20 at 0828 by telephone and verified that I am speaking with the correct person using two identifiers. [name] is currently located at home and patient are currently with her during visit. The provider, Fransisca Kaufmann Dettinger, MD is located in their office at time of visit.  Call ended at [phone]  I discussed the limitations, risks, security and privacy concerns of performing an evaluation and management service by telephone and the availability of in person appointments. I also discussed with the patient that there may be a patient responsible charge related to this service. The patient expressed understanding and agreed to proceed.   History and Present Illness: Type 2 diabetes mellitus Patient comes in today for recheck of his diabetes. Patient has been currently taking jardiance and metformin. Patient is currently on an ACE inhibitor/ARB. Patient has not seen an ophthalmologist this year. Patient denies any issues with their feet. The symptom started onset as an adult htn and hld ARE RELATED TO DM   Hypertension Patient is currently on irbesartan, and their blood pressure today is unknown. Patient denies any lightheadedness or dizziness. Patient denies headaches, blurred vision, chest pains, shortness of breath, or weakness. Denies any side effects from medication and is content with current medication.   Hyperlipidemia Patient is coming in for recheck of his hyperlipidemia. The patient is currently taking no medication currently. They deny any issues with myalgias or history of liver damage from it. They deny any focal numbness or weakness or chest pain.   BPH Patient is coming in for recheck on BPH Symptoms: none Medication: flomax Last PSA:    No diagnosis found.  Outpatient Encounter Medications as of 12/30/2020  Medication Sig  . aspirin EC 81 MG tablet Take 81 mg by mouth every evening.  .  empagliflozin (JARDIANCE) 10 MG TABS tablet Take 1 tablet (10 mg total) by mouth daily before breakfast.  . gabapentin (NEURONTIN) 100 MG capsule TAKE TWO CAPSULES BY MOUTH AT BEDTIME  . glucose blood test strip Use to check blood glucose once daily at varying times.  Dispense Verio One Touch Test strips  . irbesartan (AVAPRO) 150 MG tablet Take 1 tablet (150 mg total) by mouth daily.  . metFORMIN (GLUCOPHAGE) 1000 MG tablet TAKE 1 TABLET BY MOUTH TWICE DAILY WITH A MEAL  . Multiple Vitamins-Minerals (CENTRUM SILVER 50+MEN PO) Take 1 tablet by mouth daily. Every now and then  . ONETOUCH DELICA LANCETS 84Z MISC Use to check blood glucose once daily at varying times  . Probiotic Product (PROBIOTIC PO) Take 1 capsule by mouth 3 (three) times a week.   . tamsulosin (FLOMAX) 0.4 MG CAPS capsule Take 1 capsule (0.4 mg total) by mouth daily after supper.   No facility-administered encounter medications on file as of 12/30/2020.    Review of Systems  Constitutional: Negative for chills and fever.  Eyes: Negative for visual disturbance.  Respiratory: Negative for shortness of breath and wheezing.   Cardiovascular: Negative for chest pain and leg swelling.  Musculoskeletal: Negative for back pain and gait problem.  Skin: Negative for rash.  Neurological: Negative for dizziness, weakness and light-headedness.  All other systems reviewed and are negative.   Observations/Objective: Patient sounds comfortable and in no acute distress  Assessment and Plan: Problem List Items Addressed This Visit      Cardiovascular and Mediastinum   Hypertension associated with diabetes ([place]) - Primary   Relevant Medications  metFORMIN (GLUCOPHAGE) 1000 MG tablet   empagliflozin (JARDIANCE) 10 MG TABS tablet   irbesartan (AVAPRO) 150 MG tablet     Endocrine   Diabetes mellitus type 2 in obese ([facility])   Relevant Medications   metFORMIN (GLUCOPHAGE) 1000 MG tablet   empagliflozin (JARDIANCE) 10 MG TABS tablet    gabapentin (NEURONTIN) 100 MG capsule   irbesartan (AVAPRO) 150 MG tablet      Continue current medication Follow up plan: Return in about 3 months (around 03/30/2021), or if symptoms worsen or fail to improve, for diabetes and htn.     I discussed the assessment and treatment plan with the patient. The patient was provided an opportunity to ask questions and all were answered. The patient agreed with the plan and demonstrated an understanding of the instructions.   The patient was advised to call back or seek an in-person evaluation if the symptoms worsen or if the condition fails to improve as anticipated.  The above assessment and management plan was discussed with the patient. The patient verbalized understanding of and has agreed to the management plan. Patient is aware to call the clinic if symptoms persist or worsen. Patient is aware when to return to the clinic for a follow-up visit. Patient educated on when it is appropriate to go to the emergency department.    I provided 14 minutes of non-face-to-face time during this encounter.    Worthy Rancher, MD

## 2021-01-01 ENCOUNTER — Other Ambulatory Visit (HOSPITAL_COMMUNITY): Payer: Medicare Other

## 2021-01-07 ENCOUNTER — Inpatient Hospital Stay (HOSPITAL_COMMUNITY): Payer: Medicare Other

## 2021-01-07 ENCOUNTER — Inpatient Hospital Stay (HOSPITAL_BASED_OUTPATIENT_CLINIC_OR_DEPARTMENT_OTHER): Payer: Medicare Other | Admitting: Oncology

## 2021-01-07 ENCOUNTER — Other Ambulatory Visit: Payer: Self-pay

## 2021-01-07 VITALS — BP 150/81 | HR 63 | Temp 97.2°F | Resp 18 | Wt 265.7 lb

## 2021-01-07 DIAGNOSIS — Z7984 Long term (current) use of oral hypoglycemic drugs: Secondary | ICD-10-CM | POA: Diagnosis not present

## 2021-01-07 DIAGNOSIS — D751 Secondary polycythemia: Secondary | ICD-10-CM

## 2021-01-07 DIAGNOSIS — E119 Type 2 diabetes mellitus without complications: Secondary | ICD-10-CM | POA: Diagnosis not present

## 2021-01-07 DIAGNOSIS — K219 Gastro-esophageal reflux disease without esophagitis: Secondary | ICD-10-CM | POA: Diagnosis not present

## 2021-01-07 DIAGNOSIS — I10 Essential (primary) hypertension: Secondary | ICD-10-CM | POA: Diagnosis not present

## 2021-01-07 DIAGNOSIS — Z7982 Long term (current) use of aspirin: Secondary | ICD-10-CM | POA: Diagnosis not present

## 2021-01-07 DIAGNOSIS — R682 Dry mouth, unspecified: Secondary | ICD-10-CM | POA: Diagnosis not present

## 2021-01-07 DIAGNOSIS — G4730 Sleep apnea, unspecified: Secondary | ICD-10-CM | POA: Diagnosis not present

## 2021-01-07 NOTE — Progress Notes (Signed)
[name], [name] [id number]   CLINIC:  Medical Oncology/Hematology  PCP:  Dettinger, Fransisca Kaufmann, MD [place] [address] [phone]   REASON FOR VISIT: Follow-up for secondary polycythemia   CURRENT THERAPY: Intermittent phlebotomies    INTERVAL HISTORY:  [name] 70 y.o. male returns for routine follow-up for secondary polycythemia.   He was last seen in clinic on 09/20/2020-had 500 mL phlebotomy.  He followed up in December 2021 and did not require phlebotomy (hemoglobin 12.3 hematocrit 40)  He presents today to discuss lab work.  Reports having increased thirst and dry mouth.  Symptoms usually start a week prior to needing a phlebotomy.  He uses Biotene to help with dry mouth and tries to stay hydrated.  He uses a CPAP at night which also dries his mouth out.  He feels good today.  He was just started on Jardiance for diabetes and appears to be tolerating well.  Denies any nausea, vomiting, or diarrhea. Denies any new pains. Had not noticed any recent bleeding such as epistaxis, hematuria or hematochezia. Denies recent chest pain on exertion, shortness of breath on minimal exertion, pre-syncopal episodes, or palpitations. Denies any numbness or tingling in hands or feet. Denies any recent fevers, infections, or recent hospitalizations.   REVIEW OF SYSTEMS:  Review of Systems  Constitutional: Positive for fatigue.       Hot and sweaty and thirsty  Psychiatric/Behavioral: Positive for sleep disturbance.     PAST MEDICAL/SURGICAL HISTORY:  Past Medical History:  Diagnosis Date  . Diabetes mellitus without complication ([place])   . GERD (gastroesophageal reflux disease)   . Hx of [place] spotted fever 1-1-12011  . Hypertension   . Sleep apnea    wears cpap    Past Surgical History:  Procedure Laterality Date  . COLONOSCOPY  09/2012   Dr. Britta Mccreedy. Diverticulosis, descending adenomatous colon polyp,  hyperplastic rectal polyp. ?mass in anal canal evaluated by surgery and consistent with internal hemorrhoid  . COLONOSCOPY WITH PROPOFOL N/A 02/07/2019   Procedure: COLONOSCOPY WITH PROPOFOL;  Surgeon: Danie Binder, MD;  Location: [facility];  Service: Endoscopy;  Laterality: N/A;  9:30am  . KNEE SURGERY Left 10/2014  . KNEE SURGERY Right 09/2015  . POLYPECTOMY  02/07/2019   Procedure: POLYPECTOMY;  Surgeon: Danie Binder, MD;  Location: [facility];  Service: Endoscopy;;     SOCIAL HISTORY:  Social History   Socioeconomic History  . Marital status: Single    Spouse name: Not on file  . Number of children: 0  . Years of education: 22  . Highest education level: 12th grade  Occupational History  . 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 Smoking status: Never Smoker  . Smokeless tobacco: Never Used  Vaping Use  . Vaping Use: Never used  Substance and Sexual Activity  . Alcohol use: No  . Drug use: No  . Sexual activity: Not Currently  Other Topics Concern  . Not on file  Social History Narrative   WORKED AT [employer] > 10 YRS. NOT MARRIED. FUR BABIES: 3 DOGS AND 5 CATS. SPENDS FREE TIME: [place], [place].   Social Determinants of Health   Financial Resource Strain: Low Risk   . Difficulty of Paying Living Expenses: Not hard at all  Food Insecurity: No Food Insecurity  . Worried About Charity fundraiser in the Last  Year: Never true  . Ran Out of Food in the Last Year: Never true  Transportation Needs: No Transportation Needs  . Lack of Transportation (Medical): No  . Lack of Transportation (Non-Medical): No  Physical Activity: Sufficiently Active  . Days of Exercise per Week: 7 days  . Minutes of Exercise per Session: 40 min  Stress: No Stress Concern Present  . Feeling of Stress : Not at all  Social Connections: Moderately Integrated  . Frequency of Communication with Friends and Family: More than three times  a week  . Frequency of Social Gatherings with Friends and Family: More than three times a week  . Attends Religious Services: 1 to 4 times per year  . Active Member of Clubs or Organizations: Yes  . Attends Archivist Meetings: 1 to 4 times per year  . Marital Status: Never married  Intimate Partner Violence: Not At Risk  . Fear of Current or Ex-Partner: No  . Emotionally Abused: No  . Physically Abused: No  . Sexually Abused: No    FAMILY HISTORY:  Family History  Problem Relation Age of Onset  . Heart disease Mother   . Congestive Heart Failure Mother   . Diabetes Father   . Aneurysm Sister   . Diabetes Paternal Uncle   . Diabetes Paternal Grandmother   . Colon cancer Neg Hx   . Colon polyps Neg Hx     CURRENT MEDICATIONS:  Outpatient Encounter Medications as of 01/07/2021  Medication Sig  . aspirin EC 81 MG tablet Take 81 mg by mouth every evening.  . empagliflozin (JARDIANCE) 10 MG TABS tablet Take 1 tablet (10 mg total) by mouth daily before breakfast.  . gabapentin (NEURONTIN) 100 MG capsule Take 2 capsules (200 mg total) by mouth at bedtime.  Marland Kitchen glucose blood test strip Use to check blood glucose once daily at varying times.  Dispense Verio One Touch Test strips  . irbesartan (AVAPRO) 150 MG tablet Take 1 tablet (150 mg total) by mouth daily.  . metFORMIN (GLUCOPHAGE) 1000 MG tablet Take 1 tablet (1,000 mg total) by mouth 2 (two) times daily with a meal.  . Multiple Vitamins-Minerals (CENTRUM SILVER 50+MEN PO) Take 1 tablet by mouth daily. Every now and then  . ONETOUCH DELICA LANCETS 24M MISC Use to check blood glucose once daily at varying times  . Probiotic Product (PROBIOTIC PO) Take 1 capsule by mouth 3 (three) times a week.   . tamsulosin (FLOMAX) 0.4 MG CAPS capsule Take 1 capsule (0.4 mg total) by mouth daily after supper.   No facility-administered encounter medications on file as of 01/07/2021.    ALLERGIES:  Allergies  Allergen Reactions  .  Ramipril Cough     PHYSICAL EXAM:  ECOG Performance status: 1  Vitals:   01/07/21 0934  BP: (!) 150/81  Pulse: 63  Resp: 18  Temp: (!) 97.2 F (36.2 C)  SpO2: 96%   Filed Weights   01/07/21 0934  Weight: 265 lb 10.5 oz (120.5 kg)   Physical Exam Constitutional:      Appearance: Normal appearance. He is normal weight.  Cardiovascular:     Rate and Rhythm: Normal rate and regular rhythm.     Heart sounds: Normal heart sounds.  Pulmonary:     Effort: Pulmonary effort is normal.     Breath sounds: Normal breath sounds.  Abdominal:     General: Bowel sounds are normal.     Palpations: Abdomen is soft.  Musculoskeletal:  General: Normal range of motion.  Skin:    General: Skin is warm.  Neurological:     Mental Status: He is alert and oriented to person, place, and time. Mental status is at baseline.  Psychiatric:        Mood and Affect: Mood normal.        Behavior: Behavior normal.        Thought Content: Thought content normal.        Judgment: Judgment normal.      LABORATORY DATA:  I have reviewed the labs as listed.  CBC    Component Value Date/Time   WBC 4.8 12/27/2020 0921   RBC 5.41 12/27/2020 0921   HGB 13.3 12/27/2020 0921   HGB 18.5 (H) 12/23/2018 0804   HCT 43.0 12/27/2020 0921   HCT 53.3 (H) 12/23/2018 0804   PLT 284 12/27/2020 0921   PLT 246 12/23/2018 0804   MCV 79.5 (L) 12/27/2020 0921   MCV 95 12/23/2018 0804   MCH 24.6 (L) 12/27/2020 0921   MCHC 30.9 12/27/2020 0921   RDW 16.0 (H) 12/27/2020 0921   RDW 12.6 12/23/2018 0804   LYMPHSABS 1.3 12/27/2020 0921   LYMPHSABS 1.4 12/23/2018 0804   MONOABS 0.5 12/27/2020 0921   EOSABS 0.2 12/27/2020 0921   EOSABS 0.1 12/23/2018 0804   BASOSABS 0.1 12/27/2020 0921   BASOSABS 0.1 12/23/2018 0804   CMP Latest Ref Rng & Units 12/27/2020 11/19/2020 09/20/2020  Glucose 70 - 99 mg/dL 319(H) 252(H) 197(H)  BUN 8 - 23 mg/dL 24(H) 20 20  Creatinine 0.61 - 1.24 mg/dL 1.24 1.15 1.17  Sodium 135 -  145 mmol/L 135 132(L) 131(L)  Potassium 3.5 - 5.1 mmol/L 4.4 4.2 4.1  Chloride 98 - 111 mmol/L 103 102 100  CO2 22 - 32 mmol/L 23 23 21(L)  Calcium 8.9 - 10.3 mg/dL 9.3 8.8(L) 9.0  Total Protein 6.5 - 8.1 g/dL 7.4 7.0 7.5  Total Bilirubin 0.3 - 1.2 mg/dL 0.7 0.7 0.9  Alkaline Phos 38 - 126 U/L 76 94 64  AST 15 - 41 U/L 22 24 25   ALT 0 - 44 U/L 22 25 27     All questions were answered to patient's stated satisfaction. Encouraged patient to call with any new concerns or questions before his next visit to the cancer center and we can certain see him sooner, if needed.     ASSESSMENT & PLAN:  No problem-specific Assessment & Plan notes found for this encounter. 1.  Secondary polycythemia: - Patient reports he clotted when he would go to donate blood.  He was always hot and very thirsty.  PCP noticed his hematocrit and hemoglobin were elevated and sent him to hematology. - His blood work was evaluated he was negative for HIV, Jak 2 and BCR/ABL.  EPO level was normal at 10.  SPEP, ANA, CRP, RF were all normal. - Hemochromatosis gene evaluation showed he had a single C282Y gene and is a carrier for hemochromatosis. - He also reports he has sleep apnea and uses a CPAP machine at night.  He was referred for another sleep study evaluation.  But they canceled his appointment due to the COVID crisis. -Patient denies smoking history.  Denies any headaches or blurred vision.  Denies any skin changes. - He has been averaging a phlebotomy every 6 weeks. -Labs from 12/27/2020 show hemoglobin of 13.3 and hematocrit of 43.  He appears to be symptomatic with increased thirst and dry mouth. --Goal hematocrit is less than 45  and hemoglobin between 12-13. --Proceed with phlebotomy today.  -Return to clinic in 8 weeks for repeat labs and possible phlebotomy.  2.  Sleep apnea: - We have discussed with the patient that obstructive sleep apnea may cause secondary polycythemia. - Patient has been referred for a  sleep study evaluation. - The appointment was canceled due to the [place] crisis we will reschedule after. -Patient reports he uses a CPAP machine at night  3.  Positive hep B serology: - Patient has positive hep B surface antibody and positive hep B core antibody. Surface antigen and IgM are negative.  Hep C and HIV are negative.  Patient is immune based on prior exposure.  4.  Elevated PSA: -Patient has a PSA of 9.2 -He is followed by advanced urology. -He had a prostate biopsy on 03/30/2017 with 12 core submitted that were benign. -Patient will continue to follow-up with urology.  5.  Health maintenance: -Patient had a colonoscopy on 02/07/2019 that showed a 5 mm polyp in the ascending colon that showed tubular adenoma.  Diverticulosis in the rectosigmoid colon and the ascending colon.  Torturous colon.  External and internal hemorrhoids. -Patient will follow-up with GI as recommended.  Disposition: -Phlebotomy today. -RTC in 8 weeks for repeat labs (CBC, CMP,)  and possible phlebotomy.  Greater than 50% was spent in counseling and coordination of care with this patient including but not limited to discussion of the relevant topics above (See A&P) including, but not limited to diagnosis and management of acute and chronic medical conditions.     Orders placed this encounter:  No orders of the defined types were placed in this encounter.  Faythe Casa, NP 01/07/2021 10:09 AM  [place] [phone]

## 2021-01-07 NOTE — Progress Notes (Signed)
[name] presents today for theraputic phlebotomy per verbal order received from Faythe Casa, NP. Last hgb/hct on 12/27/20 was 13.3/43 . Pt reports he has been very thirsty the past week or so and that's how he recognizes its time for another phlebotomy. Vital signs reviewed and stablel. Pt reports eating before arrival. Procedure started at 1036 using patients right AC. 500cc blood removed. Procedure ended at 1041. Gauze applied to right AC, site clean and dry. VSS upon completion of procedure. Pt denies dizziness, lightheadedness, or feeling faint. Discharged in satisfactory condition with follow up instructions.

## 2021-01-07 NOTE — Patient Instructions (Signed)
[facility] at [facility]  Discharge Instructions:   Therapeutic Phlebotomy, Care After This sheet gives you information about how to care for yourself after your procedure. Your health care provider may also give you more specific instructions. If you have problems or questions, contact your health care provider. What can I expect after the procedure? After the procedure, it is common to have:  Light-headedness or dizziness. You may feel faint.  Nausea.  Tiredness (fatigue). Follow these instructions at home: Eating and drinking  Be sure to eat well-balanced meals for the next 24 hours.  Drink enough fluid to keep your urine pale yellow.  Avoid drinking alcohol on the day that you had the procedure. Activity  Return to your normal activities as told by your health care provider. Most people can go back to their normal activities right away.  Avoid activities that take a lot of effort for about 5 hours after the procedure. Athletes should avoid strenuous exercise for at least 12 hours.  Avoid heavy lifting or pulling for about 5 hours after the procedure. Do not lift anything that is heavier than 10 lb (4.5 kg).  Change positions slowly for the remainder of the day. This will help to prevent light-headedness or fainting.  If you feel light-headed, lie down until the feeling goes away.   Needle insertion site care  Keep your bandage (dressing) dry. You can remove the bandage after about 5 hours or as told by your health care provider.  If you have bleeding from the needle insertion site, raise (elevate) your arm and press firmly on the site until the bleeding stops.  If you have bruising at the site, apply ice to the area: ? Remove the dressing. ? Put ice in a plastic bag. ? Place a towel between your skin and the bag. ? Leave the ice on for 20 minutes, 2-3 times a day for the first 24 hours.  If the swelling does not go away after 24 hours, apply a  warm, moist cloth (warm compress) to the area for 20 minutes, 2-3 times a day.   General instructions  Do not use any products that contain nicotine or tobacco, such as cigarettes and e-cigarettes, for at least 30 minutes after the procedure.  Keep all follow-up visits as told by your health care provider. This is important. You may need to continue having regular therapeutic phlebotomy treatments as directed. Contact a health care provider if you:  Have redness, swelling, or pain at the needle insertion site.  Have fluid or blood coming from the needle insertion site.  Have pus or a bad smell coming from the needle insertion site.  Notice that the needle insertion site feels warm to the touch.  Feel light-headed, dizzy, or nauseous, and the feeling does not go away.  Have new bruising at the needle insertion site.  Feel weaker than normal.  Have a fever or chills. Get help right away if:  You faint.  You have chest pain.  You have trouble breathing.  You have severe nausea or vomiting. Summary  After the procedure, it is common to have some light-headedness, dizziness, nausea, or tiredness (fatigue).  Be sure to eat well-balanced meals for the next 24 hours. Drink enough fluid to keep your urine pale yellow.  Return to your normal activities as told by your health care provider.  Keep all follow-up visits as told by your health care provider. You may need to continue having regular therapeutic   phlebotomy treatments as directed. This information is not intended to replace advice given to you by your health care provider. Make sure you discuss any questions you have with your health care provider. Document Revised: 12/17/2017 Document Reviewed: 12/16/2017 Elsevier Patient Education  2021 [employer] Inc.  _______________________________________________________________  Thank you for choosing [facility] at [facility] to provide your oncology and  hematology care.  To afford each patient quality time with our providers, please arrive at least 15 minutes before your scheduled appointment.  You need to re-schedule your appointment if you arrive 10 or more minutes late.  We strive to give you quality time with our providers, and arriving late affects you and other patients whose appointments are after yours.  Also, if you no show three or more times for appointments you may be dismissed from the clinic.  Again, thank you for choosing [facility] at [facility]. Our hope is that these requests will allow you access to exceptional care and in a timely manner. _______________________________________________________________  If you have questions after your visit, please contact our office at [phone] between the hours of 8:30 a.m. and 5:00 p.m. Voicemails left after 4:30 p.m. will not be returned until the following business day. _______________________________________________________________  For prescription refill requests, have your pharmacy contact our office. _______________________________________________________________  Recommendations made by the consultant and any test results will be sent to your referring physician. _______________________________________________________________ 

## 2021-01-13 DIAGNOSIS — G4733 Obstructive sleep apnea (adult) (pediatric): Secondary | ICD-10-CM | POA: Diagnosis not present

## 2021-03-05 ENCOUNTER — Other Ambulatory Visit (HOSPITAL_COMMUNITY): Payer: Self-pay | Admitting: *Deleted

## 2021-03-05 DIAGNOSIS — D751 Secondary polycythemia: Secondary | ICD-10-CM

## 2021-03-06 ENCOUNTER — Inpatient Hospital Stay (HOSPITAL_COMMUNITY): Payer: Medicare Other | Attending: Hematology

## 2021-03-06 ENCOUNTER — Inpatient Hospital Stay (HOSPITAL_COMMUNITY): Payer: Medicare Other

## 2021-03-06 ENCOUNTER — Inpatient Hospital Stay (HOSPITAL_BASED_OUTPATIENT_CLINIC_OR_DEPARTMENT_OTHER): Payer: Medicare Other | Admitting: Hematology

## 2021-03-06 ENCOUNTER — Other Ambulatory Visit: Payer: Self-pay

## 2021-03-06 VITALS — BP 137/62 | HR 63 | Temp 97.0°F | Resp 18 | Wt 264.4 lb

## 2021-03-06 DIAGNOSIS — B1910 Unspecified viral hepatitis B without hepatic coma: Secondary | ICD-10-CM | POA: Diagnosis not present

## 2021-03-06 DIAGNOSIS — R972 Elevated prostate specific antigen [PSA]: Secondary | ICD-10-CM | POA: Insufficient documentation

## 2021-03-06 DIAGNOSIS — Z79899 Other long term (current) drug therapy: Secondary | ICD-10-CM | POA: Diagnosis not present

## 2021-03-06 DIAGNOSIS — I10 Essential (primary) hypertension: Secondary | ICD-10-CM | POA: Insufficient documentation

## 2021-03-06 DIAGNOSIS — Z86010 Personal history of colonic polyps: Secondary | ICD-10-CM | POA: Diagnosis not present

## 2021-03-06 DIAGNOSIS — D751 Secondary polycythemia: Secondary | ICD-10-CM | POA: Insufficient documentation

## 2021-03-06 DIAGNOSIS — K219 Gastro-esophageal reflux disease without esophagitis: Secondary | ICD-10-CM | POA: Insufficient documentation

## 2021-03-06 DIAGNOSIS — Z7982 Long term (current) use of aspirin: Secondary | ICD-10-CM | POA: Insufficient documentation

## 2021-03-06 DIAGNOSIS — G4730 Sleep apnea, unspecified: Secondary | ICD-10-CM | POA: Diagnosis not present

## 2021-03-06 DIAGNOSIS — Z7984 Long term (current) use of oral hypoglycemic drugs: Secondary | ICD-10-CM | POA: Insufficient documentation

## 2021-03-06 DIAGNOSIS — E119 Type 2 diabetes mellitus without complications: Secondary | ICD-10-CM | POA: Diagnosis not present

## 2021-03-06 LAB — COMPREHENSIVE METABOLIC PANEL
ALT: 21 U/L (ref 0–44)
AST: 23 U/L (ref 15–41)
Albumin: 3.9 g/dL (ref 3.5–5.0)
Alkaline Phosphatase: 63 U/L (ref 38–126)
Anion gap: 10 (ref 5–15)
BUN: 24 mg/dL — ABNORMAL HIGH (ref 8–23)
CO2: 25 mmol/L (ref 22–32)
Calcium: 9.1 mg/dL (ref 8.9–10.3)
Chloride: 100 mmol/L (ref 98–111)
Creatinine, Ser: 1.19 mg/dL (ref 0.61–1.24)
GFR, Estimated: >60 mL/min (ref >60)
Glucose, Bld: 237 mg/dL — ABNORMAL HIGH (ref 70–99)
Potassium: 4.5 mmol/L (ref 3.5–5.1)
Sodium: 135 mmol/L (ref 135–145)
Total Bilirubin: 0.9 mg/dL (ref 0.3–1.2)
Total Protein: 7.3 g/dL (ref 6.5–8.1)

## 2021-03-06 LAB — CBC WITH DIFFERENTIAL/PLATELET
Abs Immature Granulocytes: 0.02 10*3/uL (ref 0.00–0.07)
Basophils Absolute: 0.1 10*3/uL (ref 0.0–0.1)
Basophils Relative: 1 %
Eosinophils Absolute: 0.3 10*3/uL (ref 0.0–0.5)
Eosinophils Relative: 4 %
HCT: 43.6 % (ref 39.0–52.0)
Hemoglobin: 13.2 g/dL (ref 13.0–17.0)
Immature Granulocytes: 0 %
Lymphocytes Relative: 25 %
Lymphs Abs: 1.4 10*3/uL (ref 0.7–4.0)
MCH: 24.3 pg — ABNORMAL LOW (ref 26.0–34.0)
MCHC: 30.3 g/dL (ref 30.0–36.0)
MCV: 80.3 fL (ref 80.0–100.0)
Monocytes Absolute: 0.5 10*3/uL (ref 0.1–1.0)
Monocytes Relative: 9 %
Neutro Abs: 3.5 10*3/uL (ref 1.7–7.7)
Neutrophils Relative %: 61 %
Platelets: 293 10*3/uL (ref 150–400)
RBC: 5.43 MIL/uL (ref 4.22–5.81)
RDW: 16.5 % — ABNORMAL HIGH (ref 11.5–15.5)
WBC: 5.7 10*3/uL (ref 4.0–10.5)
nRBC: 0 % (ref 0.0–0.2)

## 2021-03-06 NOTE — Progress Notes (Signed)
[place], [place] [address]   CLINIC:  Medical Oncology/Hematology  PCP:  Dettinger, Fransisca Kaufmann, MD [place] / [place] [address]  [phone]  REASON FOR VISIT:  Follow-up for secondary polycythemia  PRIOR THERAPY: None  CURRENT THERAPY: Intermittent phlebotomies last on 01/07/2021  INTERVAL HISTORY:  [name], a 70 y.o. male, returns for routine follow-up for his secondary polycythemia. [name] was last seen by Francene Finders, NP, on 08/02/2020.  Today he reports feeling well. He had a phlebotomy on 01/07/2021 of 500 cc. He feels hot, especially at night, and his thirst increases when his blood builds up; both resolve after having a phlebotomy. He is using a CPAP machine every night but complains of his sleep cycles being off; he also takes melatonin. He also reports feeling tired today since he took hydroxyzine last night, but denies having aquagenic pruritis, Raynaud phenomenon, or burning in his fingers. He had bilateral knee replacements in 2016 and 2017 but continues having pain in his kneecaps.   REVIEW OF SYSTEMS:  Review of Systems  Constitutional: Positive for fatigue (75%). Negative for appetite change.  Musculoskeletal: Positive for arthralgias (4/10 bilat knees pain).  Skin: Negative for itching and rash.  Neurological: Negative for numbness.  Psychiatric/Behavioral: Positive for sleep disturbance (on CPAP & melatonin).  All other systems reviewed and are negative.   PAST MEDICAL/SURGICAL HISTORY:  Past Medical History:  Diagnosis Date  . Diabetes mellitus without complication ([place])   . GERD (gastroesophageal reflux disease)   . Hx of [place] spotted fever [id number]  . Hypertension   . Sleep apnea    wears cpap    Past Surgical History:  Procedure Laterality Date  . COLONOSCOPY  09/2012   Dr. Britta Mccreedy. Diverticulosis, descending adenomatous colon polyp, hyperplastic rectal polyp. ?mass in anal  canal evaluated by surgery and consistent with internal hemorrhoid  . COLONOSCOPY WITH PROPOFOL N/A 02/07/2019   Procedure: COLONOSCOPY WITH PROPOFOL;  Surgeon: Danie Binder, MD;  [facility];  Service: Endoscopy;  Laterality: N/A;  9:30am  . KNEE SURGERY Left 10/2014  . KNEE SURGERY Right 09/2015  . POLYPECTOMY  02/07/2019   Procedure: POLYPECTOMY;  Surgeon: Danie Binder, MD;  Location: [facility];  Service: Endoscopy;;    SOCIAL HISTORY:  Social History   Socioeconomic History  . Marital status: Single    Spouse name: Not on file  . Number of children: 0  . Years of education: 29  . Highest education level: 12th grade  Occupational History  . 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 Smoking status: Never Smoker  . Smokeless tobacco: Never Used  Vaping Use  . Vaping Use: Never used  Substance and Sexual Activity  . Alcohol use: No  . Drug use: No  . Sexual activity: Not Currently  Other Topics Concern  . Not on file  Social History Narrative   WORKED AT [employer] > 10 YRS. NOT MARRIED. FUR BABIES: 3 DOGS AND 5 CATS. SPENDS FREE TIME: [place], [place].   Social Determinants of Health   Financial Resource Strain: Low Risk   . Difficulty of Paying Living Expenses: Not hard at all  Food Insecurity: No Food Insecurity  . Worried About Charity fundraiser in the Last Year: Never true  . Ran Out of Food in the Last Year: Never true  Transportation Needs: No Transportation  Needs  . Lack of Transportation (Medical): No  . Lack of Transportation (Non-Medical): No  Physical Activity: Sufficiently Active  . Days of Exercise per Week: 7 days  . Minutes of Exercise per Session: 40 min  Stress: No Stress Concern Present  . Feeling of Stress : Not at all  Social Connections: Moderately Integrated  . Frequency of Communication with Friends and Family: More than three times a week  . Frequency of Social Gatherings  with Friends and Family: More than three times a week  . Attends Religious Services: 1 to 4 times per year  . Active Member of Clubs or Organizations: Yes  . Attends Archivist Meetings: 1 to 4 times per year  . Marital Status: Never married  Intimate Partner Violence: Not At Risk  . Fear of Current or Ex-Partner: No  . Emotionally Abused: No  . Physically Abused: No  . Sexually Abused: No    FAMILY HISTORY:  Family History  Problem Relation Age of Onset  . Heart disease Mother   . Congestive Heart Failure Mother   . Diabetes Father   . Aneurysm Sister   . Diabetes Paternal Uncle   . Diabetes Paternal Grandmother   . Colon cancer Neg Hx   . Colon polyps Neg Hx     CURRENT MEDICATIONS:  Current Outpatient Medications  Medication Sig Dispense Refill  . aspirin EC 81 MG tablet Take 81 mg by mouth every evening.    . empagliflozin (JARDIANCE) 10 MG TABS tablet Take 1 tablet (10 mg total) by mouth daily before breakfast. 30 tablet 5  . gabapentin (NEURONTIN) 100 MG capsule Take 2 capsules (200 mg total) by mouth at bedtime. 180 capsule 3  . glucose blood test strip Use to check blood glucose once daily at varying times.  Dispense Verio One Touch Test strips 100 each 4  . irbesartan (AVAPRO) 150 MG tablet Take 1 tablet (150 mg total) by mouth daily. 90 tablet 3  . metFORMIN (GLUCOPHAGE) 1000 MG tablet Take 1 tablet (1,000 mg total) by mouth 2 (two) times daily with a meal. 180 tablet 3  . Multiple Vitamins-Minerals (CENTRUM SILVER 50+MEN PO) Take 1 tablet by mouth daily. Every now and then    . ONETOUCH DELICA LANCETS 18H MISC Use to check blood glucose once daily at varying times 100 each 4  . Probiotic Product (PROBIOTIC PO) Take 1 capsule by mouth 3 (three) times a week.     . tamsulosin (FLOMAX) 0.4 MG CAPS capsule Take 1 capsule (0.4 mg total) by mouth daily after supper. 90 capsule 3   No current facility-administered medications for this visit.    ALLERGIES:   Allergies  Allergen Reactions  . Ramipril Cough    PHYSICAL EXAM:  Performance status (ECOG): 1 - Symptomatic but completely ambulatory  Vitals:   03/06/21 1308  BP: 137/62  Pulse: 63  Resp: 18  Temp: (!) 97 F (36.1 C)  SpO2: 97%   Wt Readings from Last 3 Encounters:  03/06/21 264 lb 6.4 oz (119.9 kg)  01/07/21 265 lb 10.5 oz (120.5 kg)  09/27/20 266 lb (120.7 kg)   Physical Exam Vitals reviewed.  Constitutional:      Appearance: Normal appearance. He is obese.  Cardiovascular:     Rate and Rhythm: Normal rate and regular rhythm.     Pulses: Normal pulses.     Heart sounds: Normal heart sounds.  Pulmonary:     Effort: Pulmonary effort is normal.  Breath sounds: Normal breath sounds.  Chest:  Breasts:     Right: No axillary adenopathy or supraclavicular adenopathy.     Left: No axillary adenopathy or supraclavicular adenopathy.    Abdominal:     Palpations: Abdomen is soft. There is no hepatomegaly, splenomegaly or mass.     Tenderness: There is no abdominal tenderness.     Hernia: A hernia is present. Hernia is present in the umbilical area.  Lymphadenopathy:     Cervical: No cervical adenopathy.     Upper Body:     Right upper body: No supraclavicular, axillary or pectoral adenopathy.     Left upper body: No supraclavicular, axillary or pectoral adenopathy.  Neurological:     General: No focal deficit present.     Mental Status: He is alert and oriented to person, place, and time.  Psychiatric:        Mood and Affect: Mood normal.        Behavior: Behavior normal.     LABORATORY DATA:  I have reviewed the labs as listed.  CBC Latest Ref Rng & Units 03/06/2021 12/27/2020 11/19/2020  WBC 4.0 - 10.5 K/uL 5.7 4.8 4.8  Hemoglobin 13.0 - 17.0 g/dL 13.2 13.3 12.3(L)  Hematocrit 39.0 - 52.0 % 43.6 43.0 40.0  Platelets 150 - 400 K/uL 293 284 279   CMP Latest Ref Rng & Units 03/06/2021 12/27/2020 11/19/2020  Glucose 70 - 99 mg/dL 237(H) 319(H) 252(H)  BUN 8 -  23 mg/dL 24(H) 24(H) 20  Creatinine 0.61 - 1.24 mg/dL 1.19 1.24 1.15  Sodium 135 - 145 mmol/L 135 135 132(L)  Potassium 3.5 - 5.1 mmol/L 4.5 4.4 4.2  Chloride 98 - 111 mmol/L 100 103 102  CO2 22 - 32 mmol/L 25 23 23   Calcium 8.9 - 10.3 mg/dL 9.1 9.3 8.8(L)  Total Protein 6.5 - 8.1 g/dL 7.3 7.4 7.0  Total Bilirubin 0.3 - 1.2 mg/dL 0.9 0.7 0.7  Alkaline Phos 38 - 126 U/L 63 76 94  AST 15 - 41 U/L 23 22 24   ALT 0 - 44 U/L 21 22 25       Component Value Date/Time   RBC 5.43 03/06/2021 1152   MCV 80.3 03/06/2021 1152   MCV 95 12/23/2018 0804   MCH 24.3 (L) 03/06/2021 1152   MCHC 30.3 03/06/2021 1152   RDW 16.5 (H) 03/06/2021 1152   RDW 12.6 12/23/2018 0804   LYMPHSABS 1.4 03/06/2021 1152   LYMPHSABS 1.4 12/23/2018 0804   MONOABS 0.5 03/06/2021 1152   EOSABS 0.3 03/06/2021 1152   EOSABS 0.1 12/23/2018 0804   BASOSABS 0.1 03/06/2021 1152   BASOSABS 0.1 12/23/2018 0804    DIAGNOSTIC IMAGING:  I have independently reviewed the scans and discussed with the patient. No results found.   ASSESSMENT:  1.  Secondary polycythemia: - Patient reports he clotted when he would go to donate blood.  He was always hot and very thirsty.  PCP noticed his hematocrit and hemoglobin were elevated and sent him to hematology. - His blood work was evaluated he was negative for HIV, Jak 2 and BCR/ABL.  EPO level was normal at 10.  SPEP, ANA, CRP, RF were all normal. - Hemochromatosis gene evaluation showed he had a single C282Y gene and is a carrier for hemochromatosis. -He has sleep apnea and uses CPAP machine. -He is a non-smoker.. - He started phlebotomies in February 2020. -He is averaging phlebotomy every 6 to 8 weeks.  2.  Positive hep B serology: -  Patient has positive hep B surface antibody and positive hep B core antibody. Surface antigen and IgM are negative.  Hep C and HIV are negative.  Patient is immune based on prior exposure.  3.  Elevated PSA: -Patient has a PSA of 9.2 -He is  followed by [facility]. -He had a prostate biopsy on 03/30/2017 with 12 core submitted that were benign.   4.  Health maintenance: -Patient had a colonoscopy on 02/07/2019 that showed a 5 mm polyp in the ascending colon that showed tubular adenoma.  Diverticulosis in the rectosigmoid colon and the ascending colon.  Torturous colon.  External and internal hemorrhoids.    PLAN:  1.  JAK2 [id number] negative Secondary polycythemia: -When his hemoglobin goes up, he feels hot and has sweating at nighttime. -He denies any aquagenic pruritus or other vasomotor symptoms. -We reviewed his labs from 03/06/2021.  Hematocrit is 43.6. -He feels better when his hematocrit is less than 40. -Last phlebotomy was on 01/05/2021. -I have recommended phlebotomy every 8 weeks for the next 2 times. -We will see him back in 4 months for follow-up with repeat labs.  2.  Sleep apnea: -Continue CPAP machine at nighttime.  3.  Hemochromatosis carrier state: -We will check ferritin and iron panel at next visit.   Orders placed this encounter:  Orders Placed This Encounter  Procedures  . CBC with Differential/Platelet  . Ferritin  . Iron and TIBC     Derek Jack, MD [place] [phone]   I, Milinda Antis, am acting as a scribe for Dr. Sanda Linger.  I, Derek Jack MD, have reviewed the above documentation for accuracy and completeness, and I agree with the above.

## 2021-03-06 NOTE — Progress Notes (Signed)
[name] presents today for theraputic phlebotomy per MD orders. Last hgb/hct on 03/06/20 was 13.2/43.6.VSS upon arrival. Pt reports eating before arrival. Reports dry mouth and thirst being his main complaint. Procedure started at 1402 using patients right AC. 500cc blood removed. Procedure ended at 1410. Gauze and bandaid applied to right AC, site clean and dry. VSS upon completion of procedure. Pt denies dizziness, lightheadedness, or feeling faint. Discharged in satisfactory condition with follow up instructions.

## 2021-03-06 NOTE — Patient Instructions (Signed)
[place] at [facility] Discharge Instructions  You were seen today by Dr. Delton Coombes. He went over your recent results. You had your phlebotomy today and will be scheduled to have another one in 2 months. Dr. Delton Coombes will see you back in 4 months for labs and follow up.   Thank you for choosing [place] at [facility] to provide your oncology and hematology care.  To afford each patient quality time with our provider, please arrive at least 15 minutes before your scheduled appointment time.   If you have a lab appointment with the [facility] please come in thru the Main Entrance and check in at the main information desk  You need to re-schedule your appointment should you arrive 10 or more minutes late.  We strive to give you quality time with our providers, and arriving late affects you and other patients whose appointments are after yours.  Also, if you no show three or more times for appointments you may be dismissed from the clinic at the providers discretion.     Again, thank you for choosing [facility].  Our hope is that these requests will decrease the amount of time that you wait before being seen by our physicians.       _____________________________________________________________  Should you have questions after your visit to [facility], please contact our office at [phone] between the hours of 8:00 a.m. and 4:30 p.m.  Voicemails left after 4:00 p.m. will not be returned until the following business day.  For prescription refill requests, have your pharmacy contact our office and allow 72 hours.    Cancer Center Support Programs:   > Cancer Support Group  2nd Tuesday of the month 1pm-2pm, Journey Room

## 2021-03-06 NOTE — Patient Instructions (Signed)

## 2021-05-06 ENCOUNTER — Inpatient Hospital Stay (HOSPITAL_COMMUNITY): Payer: Medicare Other | Attending: Hematology

## 2021-05-06 ENCOUNTER — Encounter (HOSPITAL_COMMUNITY): Payer: Self-pay

## 2021-05-06 ENCOUNTER — Other Ambulatory Visit: Payer: Self-pay

## 2021-05-06 DIAGNOSIS — D751 Secondary polycythemia: Secondary | ICD-10-CM | POA: Insufficient documentation

## 2021-05-06 NOTE — Progress Notes (Signed)
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 Phlebotomy procedure started at 13:54 pm and ended at 13:58 pm. 500 cc removed. Patient tolerated procedure well. IV needle removed intact. Phlebotomy given today per MD orders. Tolerated without adverse affects. Vital signs stable. No complaints at this time. Discharged from clinic ambulatory in stable condition. Alert and oriented x 3. F/U with [facility] as scheduled.

## 2021-05-28 ENCOUNTER — Encounter: Payer: Self-pay | Admitting: Family Medicine

## 2021-06-11 ENCOUNTER — Other Ambulatory Visit: Payer: Self-pay | Admitting: Family Medicine

## 2021-06-11 DIAGNOSIS — E669 Obesity, unspecified: Secondary | ICD-10-CM

## 2021-07-08 ENCOUNTER — Other Ambulatory Visit: Payer: Self-pay

## 2021-07-08 ENCOUNTER — Inpatient Hospital Stay (HOSPITAL_COMMUNITY): Payer: Medicare Other

## 2021-07-08 ENCOUNTER — Inpatient Hospital Stay (HOSPITAL_COMMUNITY): Payer: Medicare Other | Attending: Hematology

## 2021-07-08 DIAGNOSIS — Z7984 Long term (current) use of oral hypoglycemic drugs: Secondary | ICD-10-CM | POA: Diagnosis not present

## 2021-07-08 DIAGNOSIS — I10 Essential (primary) hypertension: Secondary | ICD-10-CM | POA: Diagnosis not present

## 2021-07-08 DIAGNOSIS — Z79899 Other long term (current) drug therapy: Secondary | ICD-10-CM | POA: Insufficient documentation

## 2021-07-08 DIAGNOSIS — K219 Gastro-esophageal reflux disease without esophagitis: Secondary | ICD-10-CM | POA: Insufficient documentation

## 2021-07-08 DIAGNOSIS — Z148 Genetic carrier of other disease: Secondary | ICD-10-CM | POA: Insufficient documentation

## 2021-07-08 DIAGNOSIS — E119 Type 2 diabetes mellitus without complications: Secondary | ICD-10-CM | POA: Diagnosis not present

## 2021-07-08 DIAGNOSIS — G4730 Sleep apnea, unspecified: Secondary | ICD-10-CM | POA: Diagnosis not present

## 2021-07-08 DIAGNOSIS — D751 Secondary polycythemia: Secondary | ICD-10-CM | POA: Diagnosis not present

## 2021-07-08 DIAGNOSIS — Z7982 Long term (current) use of aspirin: Secondary | ICD-10-CM | POA: Diagnosis not present

## 2021-07-08 LAB — CBC WITH DIFFERENTIAL/PLATELET
Abs Immature Granulocytes: 0.01 10*3/uL (ref 0.00–0.07)
Basophils Absolute: 0.1 10*3/uL (ref 0.0–0.1)
Basophils Relative: 2 %
Eosinophils Absolute: 0.2 10*3/uL (ref 0.0–0.5)
Eosinophils Relative: 4 %
HCT: 43.9 % (ref 39.0–52.0)
Hemoglobin: 13.5 g/dL (ref 13.0–17.0)
Immature Granulocytes: 0 %
Lymphocytes Relative: 25 %
Lymphs Abs: 1.4 10*3/uL (ref 0.7–4.0)
MCH: 24.2 pg — ABNORMAL LOW (ref 26.0–34.0)
MCHC: 30.8 g/dL (ref 30.0–36.0)
MCV: 78.5 fL — ABNORMAL LOW (ref 80.0–100.0)
Monocytes Absolute: 0.6 10*3/uL (ref 0.1–1.0)
Monocytes Relative: 10 %
Neutro Abs: 3.4 10*3/uL (ref 1.7–7.7)
Neutrophils Relative %: 59 %
Platelets: 269 10*3/uL (ref 150–400)
RBC: 5.59 MIL/uL (ref 4.22–5.81)
RDW: 17.8 % — ABNORMAL HIGH (ref 11.5–15.5)
WBC: 5.7 10*3/uL (ref 4.0–10.5)
nRBC: 0 % (ref 0.0–0.2)

## 2021-07-08 LAB — IRON AND TIBC
Iron: 30 ug/dL — ABNORMAL LOW (ref 45–182)
Saturation Ratios: 6 % — ABNORMAL LOW (ref 17.9–39.5)
TIBC: 487 ug/dL — ABNORMAL HIGH (ref 250–450)
UIBC: 457 ug/dL

## 2021-07-08 LAB — FERRITIN: Ferritin: 9 ng/mL — ABNORMAL LOW (ref 24–336)

## 2021-07-08 NOTE — Progress Notes (Signed)
[name], [place] [id number]   CLINIC:  Medical Oncology/Hematology  PCP:  Dettinger, Fransisca Kaufmann, MD [place] [address] [phone]   REASON FOR VISIT:  Follow-up for secondary polycythemia  PRIOR THERAPY: None  CURRENT THERAPY: Intermittent phlebotomies, last on 05/06/2021  INTERVAL HISTORY:  [name] 70 y.o. male returns for routine follow-up of secondary polycythemia.  He was last seen by Dr. Delton Zuniga on 03/06/2021.  He has had 2 phlebotomies since his last visit, one on 03/06/2021 and the other on 05/06/2021.  At today's visit, he reports feeling fairly well.  No recent hospitalizations, surgeries, or changes in baseline health status.  He reports that he uses his CPAP regularly and never sleeps without using it.   He has been tolerating his phlebotomies well, but reports that he is still hot at night, and cannot sleep under a sheet or blanket.   No  erythromelalgia, aquagenic pruritus, Raynaud's, or vasomotor symptoms.   No fever, chills, or unintentional weight loss.  He has 75% energy and 100% appetite. He endorses that he is maintaining a stable weight.    REVIEW OF SYSTEMS:  Review of Systems  Constitutional:  Negative for appetite change, chills, diaphoresis, fatigue, fever and unexpected weight change.  HENT:   Negative for lump/mass and nosebleeds.   Eyes:  Negative for eye problems.  Respiratory:  Negative for cough, hemoptysis and shortness of breath.   Cardiovascular:  Negative for chest pain, leg swelling and palpitations.  Gastrointestinal:  Negative for abdominal pain, blood in stool, constipation, diarrhea, nausea and vomiting.  Genitourinary:  Positive for frequency. Negative for hematuria.   Musculoskeletal:  Positive for arthralgias (Arthritis).  Skin: Negative.   Neurological:  Negative for dizziness, headaches and light-headedness.  Hematological:  Does not bruise/bleed easily.   Psychiatric/Behavioral:  Positive for sleep disturbance.      PAST MEDICAL/SURGICAL HISTORY:  Past Medical History:  Diagnosis Date   Diabetes mellitus without complication ([place])    GERD (gastroesophageal reflux disease)    Hx of [place] spotted fever [id number]   Hypertension    Sleep apnea    wears cpap    Past Surgical History:  Procedure Laterality Date   COLONOSCOPY  09/2012   Dr. Britta Mccreedy. Diverticulosis, descending adenomatous colon polyp, hyperplastic rectal polyp. ?mass in anal canal evaluated by surgery and consistent with internal hemorrhoid   COLONOSCOPY WITH PROPOFOL N/A 02/07/2019   Procedure: COLONOSCOPY WITH PROPOFOL;  Surgeon: Danie Binder, MD;  Location: [facility];  Service: Endoscopy;  Laterality: N/A;  9:30am   KNEE SURGERY Left 10/2014   KNEE SURGERY Right 09/2015   POLYPECTOMY  02/07/2019   Procedure: POLYPECTOMY;  Surgeon: Danie Binder, MD;  Location: [facility];  Service: Endoscopy;;     SOCIAL HISTORY:  Social History   Socioeconomic History   Marital status: Single    Spouse name: Not on file   Number of children: 0   Years of education: 12   Highest education level: 12th grade  Occupational History   Occupation: Retired    Comment: Pensions consultant and gamble  Tobacco Use   Smoking status: Never   Smokeless tobacco: Never  Vaping Use   Vaping Use: Never used  Substance and Sexual Activity   Alcohol use: No   Drug use: No   Sexual activity: Not Currently  Other Topics Concern   Not on file  Social History Narrative   WORKED AT [employer]  AND [employer] FOR > 10 YRS. NOT MARRIED. FUR BABIES: 3 DOGS AND 5 CATS. SPENDS FREE TIME: [place], [place], [place].   Social Determinants of Health   Financial Resource Strain: Low Risk    Difficulty of Paying Living Expenses: Not hard at all  Food Insecurity: No Food Insecurity   Worried About Charity fundraiser in the Last Year: Never true   [place] in the Last Year: Never true   Transportation Needs: No Transportation Needs   Lack of Transportation (Medical): No   Lack of Transportation (Non-Medical): No  Physical Activity: Sufficiently Active   Days of Exercise per Week: 7 days   Minutes of Exercise per Session: 40 min  Stress: No Stress Concern Present   Feeling of Stress : Not at all  Social Connections: Moderately Integrated   Frequency of Communication with Friends and Family: More than three times a week   Frequency of Social Gatherings with Friends and Family: More than three times a week   Attends Religious Services: 1 to 4 times per year   Active Member of [employer] or Organizations: Yes   Attends Archivist Meetings: 1 to 4 times per year   Marital Status: Never married  Human resources officer Violence: Not At Risk   Fear of Current or Ex-Partner: No   Emotionally Abused: No   Physically Abused: No   Sexually Abused: No    FAMILY HISTORY:  Family History  Problem Relation Age of Onset   Heart disease Mother    Congestive Heart Failure Mother    Diabetes Father    Aneurysm Sister    Diabetes Paternal Uncle    Diabetes Paternal Grandmother    Colon cancer Neg Hx    Colon polyps Neg Hx     CURRENT MEDICATIONS:  Outpatient Encounter Medications as of 07/09/2021  Medication Sig   aspirin EC 81 MG tablet Take 81 mg by mouth every evening.   gabapentin (NEURONTIN) 100 MG capsule Take 2 capsules (200 mg total) by mouth at bedtime.   glucose blood test strip Use to check blood glucose once daily at varying times.  Dispense Verio One Touch Test strips   irbesartan (AVAPRO) 150 MG tablet Take 1 tablet (150 mg total) by mouth daily.   JARDIANCE 10 MG TABS tablet TAKE 1 TABLET BY MOUTH DAILY BEFORE BREAKFAST   metFORMIN (GLUCOPHAGE) 1000 MG tablet Take 1 tablet (1,000 mg total) by mouth 2 (two) times daily with a meal.   Multiple Vitamins-Minerals (CENTRUM SILVER 50+MEN PO) Take 1 tablet by mouth daily. Every now and then   [facility] LANCETS  [id number] MISC Use to check blood glucose once daily at varying times   Probiotic Product (PROBIOTIC PO) Take 1 capsule by mouth 3 (three) times a week.    tamsulosin (FLOMAX) 0.4 MG CAPS capsule Take 1 capsule (0.4 mg total) by mouth daily after supper.   No facility-administered encounter medications on file as of 07/09/2021.    ALLERGIES:  Allergies  Allergen Reactions   Ramipril Cough     PHYSICAL EXAM:  ECOG PERFORMANCE STATUS: 0 - Asymptomatic  There were no vitals filed for this visit. There were no vitals filed for this visit. Physical Exam Constitutional:      Appearance: Normal appearance. He is obese.  HENT:     Head: Normocephalic and atraumatic.     Mouth/Throat:     Mouth: Mucous membranes are moist.  Eyes:     Extraocular Movements:  Extraocular movements intact.     Pupils: Pupils are equal, round, and reactive to light.  Cardiovascular:     Rate and Rhythm: Normal rate and regular rhythm.     Pulses: Normal pulses.     Heart sounds: Normal heart sounds.  Pulmonary:     Effort: Pulmonary effort is normal.     Breath sounds: Normal breath sounds.  Abdominal:     General: Bowel sounds are normal.     Palpations: Abdomen is soft.     Tenderness: There is no abdominal tenderness.  Musculoskeletal:        General: No swelling.     Right lower leg: No edema.     Left lower leg: No edema.  Lymphadenopathy:     Cervical: No cervical adenopathy.  Skin:    General: Skin is warm and dry.  Neurological:     General: No focal deficit present.     Mental Status: He is alert and oriented to person, place, and time.  Psychiatric:        Mood and Affect: Mood normal.        Behavior: Behavior normal.     LABORATORY DATA:  I have reviewed the labs as listed.  CBC    Component Value Date/Time   WBC 5.7 03/06/2021 1152   RBC 5.43 03/06/2021 1152   HGB 13.2 03/06/2021 1152   HGB 18.5 (H) 12/23/2018 0804   HCT 43.6 03/06/2021 1152   HCT 53.3 (H) 12/23/2018 0804    PLT 293 03/06/2021 1152   PLT 246 12/23/2018 0804   MCV 80.3 03/06/2021 1152   MCV 95 12/23/2018 0804   MCH 24.3 (L) 03/06/2021 1152   MCHC 30.3 03/06/2021 1152   RDW 16.5 (H) 03/06/2021 1152   RDW 12.6 12/23/2018 0804   LYMPHSABS 1.4 03/06/2021 1152   LYMPHSABS 1.4 12/23/2018 0804   MONOABS 0.5 03/06/2021 1152   EOSABS 0.3 03/06/2021 1152   EOSABS 0.1 12/23/2018 0804   BASOSABS 0.1 03/06/2021 1152   BASOSABS 0.1 12/23/2018 0804   CMP Latest Ref Rng & Units 03/06/2021 12/27/2020 11/19/2020  Glucose 70 - 99 mg/dL 237(H) 319(H) 252(H)  BUN 8 - 23 mg/dL 24(H) 24(H) 20  Creatinine 0.61 - 1.24 mg/dL 1.19 1.24 1.15  Sodium 135 - 145 mmol/L 135 135 132(L)  Potassium 3.5 - 5.1 mmol/L 4.5 4.4 4.2  Chloride 98 - 111 mmol/L 100 103 102  CO2 22 - 32 mmol/L '25 23 23  '$ Calcium 8.9 - 10.3 mg/dL 9.1 9.3 8.8(L)  Total Protein 6.5 - 8.1 g/dL 7.3 7.4 7.0  Total Bilirubin 0.3 - 1.2 mg/dL 0.9 0.7 0.7  Alkaline Phos 38 - 126 U/L 63 76 94  AST 15 - 41 U/L '23 22 24  '$ ALT 0 - 44 U/L '21 22 25    '$ DIAGNOSTIC IMAGING:  I have independently reviewed the relevant imaging and discussed with the patient.  ASSESSMENT & PLAN: 1.  Secondary polycythemia (JAK2 negative): - Patient tested NEGATIVE for JAK2 mutations, CALR, and MPL. - Erythropoietin level was normal at 10 - Other work-up was normal (BCR/ABL, HIV, SPEP, ANA, CRP, RF) - He has sleep apnea and uses CPAP machine.  Lifelong non-smoker. - He started phlebotomies in February 2020, averaging phlebotomy every 6 to 8 weeks. - Reports excessive heat, night sweats, and thirst when his Hgb/HCT is elevated.  Denies aquagenic pruritus or other vasomotor symptoms. - Patient reports that he feels better when HCT < 40 - Differential diagnosis favors secondary  polycythemia in the setting of OSA, but it is also possible that he may have triple negative primary polycythemia - Most recent labs (07/08/2021): Hgb 13.5 with hematocrit 43.9, MCV 78.5 - PLAN: Continue CBC  and phlebotomy every 8 weeks.  Office visit in 24 weeks.  2.  Sleep apnea - Patient reports compliance with CPAP nightly - PLAN: Continue CPAP  3.  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 - Most recent iron panel (07/08/2021) negative for iron overload, shows iron deficiency induced by phlebotomies with ferritin 9, serum iron 30, and iron saturation 6%, TIBC elevated 487 - PLAN: Continue with phlebotomy for polycythemia as above, will periodically check iron.  Management of iron deficiency in the setting of microcytic polycythemia is poorly defined, but it is reasonable to consider combination of phlebotomy in addition to oral iron therapy in order to improve iron stores while safely maintaining an asymptomatic hematocrit level.  Recommended to patient that he start taking ferrous sulfate 325 mg OTC daily.  We will recheck iron panel at his follow-up visit in 24 weeks.  4.   Health Maintenance & Other: - Patient had a colonoscopy on 02/07/2019 that showed a 5 mm polyp in the ascending colon that showed tubular adenoma.  Diverticulosis in the rectosigmoid colon and the ascending colon.  Torturous colon.  External and internal hemorrhoids. - Elevated PSA:  Patient has a PSA of 9.2, followed by [facility].  Prostate biopsy on 03/30/2017 with 12 cores submitted that were benign. - Positive hep B serology:  Patient has positive hep B surface antibody and positive hep B core antibody. Surface antigen and IgM are negative.  Hep C and HIV are negative. Patient is immune based on prior exposure.   PLAN SUMMARY & DISPOSITION: -CBC/phlebotomy every 8 weeks - Office visit and iron panel in 24 weeks  All questions were answered. The patient knows to call the clinic with any problems, questions or concerns.  Medical decision making: Low  Time spent on visit: I spent 20 minutes counseling the patient face to  face. The total time spent in the appointment was 30 minutes and more than 50% was on counseling.   Harriett Rush, PA-C  07/09/2021 8:44 AM

## 2021-07-09 ENCOUNTER — Encounter (HOSPITAL_COMMUNITY): Payer: Medicare Other

## 2021-07-09 ENCOUNTER — Ambulatory Visit (HOSPITAL_COMMUNITY): Payer: Medicare Other | Admitting: Hematology and Oncology

## 2021-07-09 ENCOUNTER — Inpatient Hospital Stay (HOSPITAL_BASED_OUTPATIENT_CLINIC_OR_DEPARTMENT_OTHER): Payer: Medicare Other | Admitting: Physician Assistant

## 2021-07-09 ENCOUNTER — Encounter (HOSPITAL_COMMUNITY): Payer: Self-pay

## 2021-07-09 ENCOUNTER — Inpatient Hospital Stay (HOSPITAL_COMMUNITY): Payer: Medicare Other

## 2021-07-09 VITALS — BP 134/62 | HR 63 | Temp 97.1°F | Resp 20 | Wt 263.7 lb

## 2021-07-09 DIAGNOSIS — G4730 Sleep apnea, unspecified: Secondary | ICD-10-CM | POA: Diagnosis not present

## 2021-07-09 DIAGNOSIS — D751 Secondary polycythemia: Secondary | ICD-10-CM | POA: Diagnosis not present

## 2021-07-09 DIAGNOSIS — Z148 Genetic carrier of other disease: Secondary | ICD-10-CM

## 2021-07-09 DIAGNOSIS — Z7982 Long term (current) use of aspirin: Secondary | ICD-10-CM | POA: Diagnosis not present

## 2021-07-09 DIAGNOSIS — Z79899 Other long term (current) drug therapy: Secondary | ICD-10-CM | POA: Diagnosis not present

## 2021-07-09 DIAGNOSIS — I10 Essential (primary) hypertension: Secondary | ICD-10-CM | POA: Diagnosis not present

## 2021-07-09 DIAGNOSIS — E119 Type 2 diabetes mellitus without complications: Secondary | ICD-10-CM | POA: Diagnosis not present

## 2021-07-09 DIAGNOSIS — Z7984 Long term (current) use of oral hypoglycemic drugs: Secondary | ICD-10-CM | POA: Diagnosis not present

## 2021-07-09 DIAGNOSIS — K219 Gastro-esophageal reflux disease without esophagitis: Secondary | ICD-10-CM | POA: Diagnosis not present

## 2021-07-09 NOTE — Patient Instructions (Signed)
[name]  Discharge Instructions: Thank you for choosing [facility] to provide your oncology and hematology care.  If you have a lab appointment with the [facility], please come in thru the Main Entrance and check in at the main information desk.  Wear comfortable clothing and clothing appropriate for easy access to any Portacath or PICC line.   We strive to give you quality time with your provider. You may need to reschedule your appointment if you arrive late (15 or more minutes).  Arriving late affects you and other patients whose appointments are after yours.  Also, if you miss three or more appointments without notifying the office, you may be dismissed from the clinic at the provider's discretion.      For prescription refill requests, have your pharmacy contact our office and allow 72 hours for refills to be completed.    Today you received the following: Phlebotomy       To help prevent nausea and vomiting after your treatment, we encourage you to take your nausea medication as directed.  BELOW ARE SYMPTOMS THAT SHOULD BE REPORTED IMMEDIATELY: *FEVER GREATER THAN 100.4 F (38 C) OR HIGHER *CHILLS OR SWEATING *NAUSEA AND VOMITING THAT IS NOT CONTROLLED WITH YOUR NAUSEA MEDICATION *UNUSUAL SHORTNESS OF BREATH *UNUSUAL BRUISING OR BLEEDING *URINARY PROBLEMS (pain or burning when urinating, or frequent urination) *BOWEL PROBLEMS (unusual diarrhea, constipation, pain near the anus) TENDERNESS IN MOUTH AND THROAT WITH OR WITHOUT PRESENCE OF ULCERS (sore throat, sores in mouth, or a toothache) UNUSUAL RASH, SWELLING OR PAIN  UNUSUAL VAGINAL DISCHARGE OR ITCHING   Items with * indicate a potential emergency and should be followed up as soon as possible or go to the Emergency Department if any problems should occur.  Please show the CHEMOTHERAPY ALERT CARD or IMMUNOTHERAPY ALERT CARD at check-in to the Emergency Department and triage nurse.  Should you  have questions after your visit or need to cancel or reschedule your appointment, please contact [facility] [phone]  and follow the prompts.  Office hours are 8:00 a.m. to 4:30 p.m. Monday - Friday. Please note that voicemails left after 4:00 p.m. may not be returned until the following business day.  We are closed weekends and major holidays. You have access to a nurse at all times for urgent questions. Please call the main number to the clinic [phone] and follow the prompts.  For any non-urgent questions, you may also contact your provider using MyChart. We now offer e-Visits for anyone 71 and older to request care online for non-urgent symptoms. For details visit [email or web address].   Also download the MyChart app! Go to the app store, search "MyChart", open the app, select [facility], and log in with your MyChart username and password.  Due to Covid, a mask is required upon entering the hospital/clinic. If you do not have a mask, one will be given to you upon arrival. For doctor visits, patients may have 1 support person aged 21 or older with them. For treatment visits, patients cannot have anyone with them due to current Covid guidelines and our immunocompromised population.

## 2021-07-09 NOTE — Progress Notes (Signed)
[name] presents for therapeutic phlebotomy per MD orders. Last HGB 13.5 / HCT 43.9 on 07/08/21 . Vital signs stable prior to procedure. Procedure started at 0855 and ended at 0900. 500 mls of blood removed. Patient denies any dizziness , lightheadedness, or feeling faint.  Gauze and coban applied to site. Vital signs stable at completion of procedure. Patient has no complaints at this time. Alert and oriented x 3. Discharged in stable condition.    Phlebotomy given today per MD orders. Tolerated without adverse affects. Vital signs stable. No complaints at this time. Discharged from clinic ambulatory in stable condition. Alert and oriented x 3. F/U with [facility] as scheduled.

## 2021-07-09 NOTE — Patient Instructions (Signed)
[place] at [facility] Discharge Instructions  You were seen today by Tarri Abernethy PA-C for your elevated red blood cells (polycythemia).  Your red blood cells are at their target level right now.  We will continue your phlebotomy every 8 weeks.  Because of your frequent phlebotomy, your iron levels are low.  I want you to start taking ferrous sulfate 325 mg 1 tablet daily.  You do have genetic predisposition for possible iron overload (hereditary hemochromatosis carrier state), but your iron levels have actually been low.  We will continue to keep an eye on them in your upcoming visits.  LABS: Return in 8 weeks for repeat blood counts  OTHER TESTS: None  MEDICATIONS: Start taking ferrous sulfate 325 mg (available over-the-counter)  FOLLOW-UP APPOINTMENT: Office visit in 24 weeks   Thank you for choosing [place] at [facility] to provide your oncology and hematology care.  To afford each patient quality time with our provider, please arrive at least 15 minutes before your scheduled appointment time.   If you have a lab appointment with the [facility] please come in thru the Main Entrance and check in at the main information desk.  You need to re-schedule your appointment should you arrive 10 or more minutes late.  We strive to give you quality time with our providers, and arriving late affects you and other patients whose appointments are after yours.  Also, if you no show three or more times for appointments you may be dismissed from the clinic at the providers discretion.     Again, thank you for choosing [facility].  Our hope is that these requests will decrease the amount of time that you wait before being seen by our physicians.       _____________________________________________________________  Should you have questions after your visit to [facility], please contact our office at [phone]  and follow the prompts.  Our office hours are 8:00 a.m. and 4:30 p.m. Monday - Friday.  Please note that voicemails left after 4:00 p.m. may not be returned until the following business day.  We are closed weekends and major holidays.  You do have access to a nurse 24-7, just call the main number to the clinic [phone] and do not press any options, hold on the line and a nurse will answer the phone.    For prescription refill requests, have your pharmacy contact our office and allow 72 hours.    Due to Covid, you will need to wear a mask upon entering the hospital. If you do not have a mask, a mask will be given to you at the Main Entrance upon arrival. For doctor visits, patients may have 1 support person age 30 or older with them. For treatment visits, patients can not have anyone with them due to social distancing guidelines and our immunocompromised population.

## 2021-07-09 NOTE — Addendum Note (Signed)
Addended by: Tarri Abernethy on: 07/09/2021 08:50 AM   Modules accepted: Orders

## 2021-07-17 ENCOUNTER — Other Ambulatory Visit: Payer: Self-pay

## 2021-07-17 ENCOUNTER — Ambulatory Visit (INDEPENDENT_AMBULATORY_CARE_PROVIDER_SITE_OTHER): Payer: Medicare Other

## 2021-07-17 ENCOUNTER — Ambulatory Visit: Payer: Medicare Other | Admitting: Podiatry

## 2021-07-17 DIAGNOSIS — M722 Plantar fascial fibromatosis: Secondary | ICD-10-CM

## 2021-07-17 DIAGNOSIS — L603 Nail dystrophy: Secondary | ICD-10-CM

## 2021-07-17 MED ORDER — MELOXICAM 15 MG PO TABS: 15.0000 mg | ORAL_TABLET | Freq: Every day | ORAL | 0 refills | Status: DC

## 2021-07-17 NOTE — Patient Instructions (Signed)
You can use "urea nail gel" on the toenail  For instructions on how to put on your Plantar Fascial Brace, please visit [email or web address]   Plantar Fasciitis (Heel Spur Syndrome) with Rehab The plantar fascia is a fibrous, ligament-like, soft-tissue structure that spans the bottom of the foot. Plantar fasciitis is a condition that causes pain in the foot due to inflammation of the tissue. SYMPTOMS  Pain and tenderness on the underneath side of the foot. Pain that worsens with standing or walking. CAUSES  Plantar fasciitis is caused by irritation and injury to the plantar fascia on the underneath side of the foot. Common mechanisms of injury include: Direct trauma to bottom of the foot. Damage to a small nerve that runs under the foot where the main fascia attaches to the heel bone. Stress placed on the plantar fascia due to bone spurs. RISK INCREASES WITH:  Activities that place stress on the plantar fascia (running, jumping, pivoting, or cutting). Poor strength and flexibility. Improperly fitted shoes. Tight calf muscles. Flat feet. Failure to warm-up properly before activity. Obesity. PREVENTION Warm up and stretch properly before activity. Allow for adequate recovery between workouts. Maintain physical fitness: Strength, flexibility, and endurance. Cardiovascular fitness. Maintain a health body weight. Avoid stress on the plantar fascia. Wear properly fitted shoes, including arch supports for individuals who have flat feet.  PROGNOSIS  If treated properly, then the symptoms of plantar fasciitis usually resolve without surgery. However, occasionally surgery is necessary.  RELATED COMPLICATIONS  Recurrent symptoms that may result in a chronic condition. Problems of the lower back that are caused by compensating for the injury, such as limping. Pain or weakness of the foot during push-off following surgery. Chronic inflammation, scarring, and partial or complete fascia  tear, occurring more often from repeated injections.  TREATMENT  Treatment initially involves the use of ice and medication to help reduce pain and inflammation. The use of strengthening and stretching exercises may help reduce pain with activity, especially stretches of the Achilles tendon. These exercises may be performed at home or with a therapist. Your caregiver may recommend that you use heel cups of arch supports to help reduce stress on the plantar fascia. Occasionally, corticosteroid injections are given to reduce inflammation. If symptoms persist for greater than 6 months despite non-surgical (conservative), then surgery may be recommended.   MEDICATION  If pain medication is necessary, then nonsteroidal anti-inflammatory medications, such as aspirin and ibuprofen, or other minor pain relievers, such as acetaminophen, are often recommended. Do not take pain medication within 7 days before surgery. Prescription pain relievers may be given if deemed necessary by your caregiver. Use only as directed and only as much as you need. Corticosteroid injections may be given by your caregiver. These injections should be reserved for the most serious cases, because they may only be given a certain number of times.  HEAT AND COLD Cold treatment (icing) relieves pain and reduces inflammation. Cold treatment should be applied for 10 to 15 minutes every 2 to 3 hours for inflammation and pain and immediately after any activity that aggravates your symptoms. Use ice packs or massage the area with a piece of ice (ice massage). Heat treatment may be used prior to performing the stretching and strengthening activities prescribed by your caregiver, physical therapist, or athletic trainer. Use a heat pack or soak the injury in warm water.  SEEK IMMEDIATE MEDICAL CARE IF: Treatment seems to offer no benefit, or the condition worsens. Any medications produce adverse side effects.  EXERCISES-  RANGE OF MOTION  (ROM) AND STRETCHING EXERCISES - Plantar Fasciitis (Heel Spur Syndrome) These exercises may help you when beginning to rehabilitate your injury. Your symptoms may resolve with or without further involvement from your physician, physical therapist or athletic trainer. While completing these exercises, remember:  Restoring tissue flexibility helps normal motion to return to the joints. This allows healthier, less painful movement and activity. An effective stretch should be held for at least 30 seconds. A stretch should never be painful. You should only feel a gentle lengthening or release in the stretched tissue.  RANGE OF MOTION - Toe Extension, Flexion Sit with your right / left leg crossed over your opposite knee. Grasp your toes and gently pull them back toward the top of your foot. You should feel a stretch on the bottom of your toes and/or foot. Hold this stretch for 10 seconds. Now, gently pull your toes toward the bottom of your foot. You should feel a stretch on the top of your toes and or foot. Hold this stretch for 10 seconds. Repeat  times. Complete this stretch 3 times per day.   RANGE OF MOTION - Ankle Dorsiflexion, Active Assisted Remove shoes and sit on a chair that is preferably not on a carpeted surface. Place right / left foot under knee. Extend your opposite leg for support. Keeping your heel down, slide your right / left foot back toward the chair until you feel a stretch at your ankle or calf. If you do not feel a stretch, slide your bottom forward to the edge of the chair, while still keeping your heel down. Hold this stretch for 10 seconds. Repeat 3 times. Complete this stretch 2 times per day.   STRETCH  Gastroc, Standing Place hands on wall. Extend right / left leg, keeping the front knee somewhat bent. Slightly point your toes inward on your back foot. Keeping your right / left heel on the floor and your knee straight, shift your weight toward the wall, not allowing  your back to arch. You should feel a gentle stretch in the right / left calf. Hold this position for 10 seconds. Repeat 3 times. Complete this stretch 2 times per day.  STRETCH  Soleus, Standing Place hands on wall. Extend right / left leg, keeping the other knee somewhat bent. Slightly point your toes inward on your back foot. Keep your right / left heel on the floor, bend your back knee, and slightly shift your weight over the back leg so that you feel a gentle stretch deep in your back calf. Hold this position for 10 seconds. Repeat 3 times. Complete this stretch 2 times per day.  STRETCH  Gastrocsoleus, Standing  Note: This exercise can place a lot of stress on your foot and ankle. Please complete this exercise only if specifically instructed by your caregiver.  Place the ball of your right / left foot on a step, keeping your other foot firmly on the same step. Hold on to the wall or a rail for balance. Slowly lift your other foot, allowing your body weight to press your heel down over the edge of the step. You should feel a stretch in your right / left calf. Hold this position for 10 seconds. Repeat this exercise with a slight bend in your right / left knee. Repeat 3 times. Complete this stretch 2 times per day.   STRENGTHENING EXERCISES - Plantar Fasciitis (Heel Spur Syndrome)  These exercises may help you when beginning to rehabilitate your injury.  They may resolve your symptoms with or without further involvement from your physician, physical therapist or athletic trainer. While completing these exercises, remember:  Muscles can gain both the endurance and the strength needed for everyday activities through controlled exercises. Complete these exercises as instructed by your physician, physical therapist or athletic trainer. Progress the resistance and repetitions only as guided.  STRENGTH - Towel Curls Sit in a chair positioned on a non-carpeted surface. Place your foot on a  towel, keeping your heel on the floor. Pull the towel toward your heel by only curling your toes. Keep your heel on the floor. Repeat 3 times. Complete this exercise 2 times per day.  STRENGTH - Ankle Inversion Secure one end of a rubber exercise band/tubing to a fixed object (table, pole). Loop the other end around your foot just before your toes. Place your fists between your knees. This will focus your strengthening at your ankle. Slowly, pull your big toe up and in, making sure the band/tubing is positioned to resist the entire motion. Hold this position for 10 seconds. Have your muscles resist the band/tubing as it slowly pulls your foot back to the starting position. Repeat 3 times. Complete this exercises 2 times per day.  Document Released: 11/30/2005 Document Revised: 02/22/2012 Document Reviewed: 03/14/2009 [facility] Patient Information 2014 [place], [place].

## 2021-07-17 NOTE — Progress Notes (Signed)
DG FOOT

## 2021-07-21 ENCOUNTER — Encounter: Payer: Self-pay | Admitting: Family Medicine

## 2021-07-21 DIAGNOSIS — I152 Hypertension secondary to endocrine disorders: Secondary | ICD-10-CM

## 2021-07-21 DIAGNOSIS — E1169 Type 2 diabetes mellitus with other specified complication: Secondary | ICD-10-CM

## 2021-07-21 DIAGNOSIS — E1159 Type 2 diabetes mellitus with other circulatory complications: Secondary | ICD-10-CM

## 2021-07-22 ENCOUNTER — Other Ambulatory Visit: Payer: Self-pay | Admitting: Podiatry

## 2021-07-22 DIAGNOSIS — M722 Plantar fascial fibromatosis: Secondary | ICD-10-CM

## 2021-07-22 NOTE — Progress Notes (Signed)
Subjective:   Patient ID: [name], male   DOB: 70 y.o.   MRN: [id number]   HPI 70 year old male presents the office today for concerns of heel pain on the right side.  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  Denies any twisting injury.  He states he gets pain mostly in the morning or after standing and gets better with activity.  He tried hot water and taking Aleve.  Also concern about thick toenails.  No other concerns.   Review of Systems  All other systems reviewed and are negative.  Past Medical History:  Diagnosis Date   Diabetes mellitus without complication ([place])    GERD (gastroesophageal reflux disease)    Hx of [place] spotted fever [id number]   Hypertension    Sleep apnea    wears cpap     Past Surgical History:  Procedure Laterality Date   COLONOSCOPY  09/2012   Dr. Britta Mccreedy. Diverticulosis, descending adenomatous colon polyp, hyperplastic rectal polyp. ?mass in anal canal evaluated by surgery and consistent with internal hemorrhoid   COLONOSCOPY WITH PROPOFOL N/A 02/07/2019   Procedure: COLONOSCOPY WITH PROPOFOL;  Surgeon: Danie Binder, MD;  Location: [facility];  Service: Endoscopy;  Laterality: N/A;  9:30am   KNEE SURGERY Left 10/2014   KNEE SURGERY Right 09/2015   POLYPECTOMY  02/07/2019   Procedure: POLYPECTOMY;  Surgeon: Danie Binder, MD;  Location: [facility];  Service: Endoscopy;;     Current Outpatient Medications:    meloxicam (MOBIC) 15 MG tablet, Take 1 tablet (15 mg total) by mouth daily., Disp: 14 tablet, Rfl: 0   aspirin EC 81 MG tablet, Take 81 mg by mouth every evening., Disp: , Rfl:    gabapentin (NEURONTIN) 100 MG capsule, Take 2 capsules (200 mg total) by mouth at bedtime., Disp: 180 capsule, Rfl: 3   glucose blood test strip, Use to check blood glucose once daily at varying times.  Dispense Verio One Touch Test strips, Disp: 100 each, Rfl: 4   irbesartan (AVAPRO) 150 MG  tablet, Take 1 tablet (150 mg total) by mouth daily., Disp: 90 tablet, Rfl: 3   JARDIANCE 10 MG TABS tablet, TAKE 1 TABLET BY MOUTH DAILY BEFORE BREAKFAST, Disp: 30 tablet, Rfl: 1   metFORMIN (GLUCOPHAGE) 1000 MG tablet, Take 1 tablet (1,000 mg total) by mouth 2 (two) times daily with a meal., Disp: 180 tablet, Rfl: 3   Multiple Vitamins-Minerals (CENTRUM SILVER 50+MEN PO), Take 1 tablet by mouth daily. Every now and then, Disp: , Rfl:    ONETOUCH DELICA LANCETS 99991111 MISC, Use to check blood glucose once daily at varying times, Disp: 100 each, Rfl: 4   Probiotic Product (PROBIOTIC PO), Take 1 capsule by mouth 3 (three) times a week. , Disp: , Rfl:    tamsulosin (FLOMAX) 0.4 MG CAPS capsule, Take 1 capsule (0.4 mg total) by mouth daily after supper., Disp: 90 capsule, Rfl: 3  Allergies  Allergen Reactions   Ramipril Cough          Objective:  Physical Exam  General: AAO x3, NAD  Dermatological: Second digit nails are hypertrophic and dystrophic.  No significant pain in the nails there is no redness or drainage or signs of infection.  No open lesions.  Vascular: Dorsalis Pedis artery and Posterior Tibial artery pedal pulses are 2/4 bilateral with immedate capillary fill time. There is no pain with calf compression, swelling, warmth, erythema.  Neruologic: Grossly intact via light touch bilateral.  Negative Tinel sign.  Musculoskeletal: Tenderness to palpation along the plantar medial tubercle of the calcaneus at the insertion of plantar fascia on the right foot. There is no pain along the course of the plantar fascia within the arch of the foot. Plantar fascia appears to be intact. There is no pain with lateral compression of the calcaneus or pain with vibratory sensation. There is no pain along the course or insertion of the achilles tendon. No other areas of tenderness to bilateral lower extremities.Muscular strength 5/5 in all groups tested bilateral.  Gait: Unassisted, Nonantalgic.        Assessment:   Right heel pain, plantar fasciitis; onychodystrophy     Plan:  -Treatment options discussed including all alternatives, risks, and complications -Etiology of symptoms were discussed -X-rays were obtained and reviewed with the patient.  There is no evidence of acute fracture or stress fracture identified today. -We discussed steroid injection.  Prescribed meloxicam.  Night splint dispensed given the morning discomfort.  Discussed shoe modifications and arch supports. -Urea nail gel for the toenail  Trula Slade DPM

## 2021-07-24 ENCOUNTER — Other Ambulatory Visit: Payer: Medicare Other

## 2021-07-24 ENCOUNTER — Other Ambulatory Visit: Payer: Self-pay

## 2021-07-24 DIAGNOSIS — I152 Hypertension secondary to endocrine disorders: Secondary | ICD-10-CM

## 2021-07-24 DIAGNOSIS — E1169 Type 2 diabetes mellitus with other specified complication: Secondary | ICD-10-CM | POA: Diagnosis not present

## 2021-07-24 DIAGNOSIS — E1159 Type 2 diabetes mellitus with other circulatory complications: Secondary | ICD-10-CM | POA: Diagnosis not present

## 2021-07-24 LAB — CMP14+EGFR
ALT: 25 IU/L (ref 0–44)
AST: 25 IU/L (ref 0–40)
Albumin/Globulin Ratio: 1.6 (ref 1.2–2.2)
Albumin: 4.6 g/dL (ref 3.8–4.8)
Alkaline Phosphatase: 74 IU/L (ref 44–121)
BUN/Creatinine Ratio: 22 (ref 10–24)
BUN: 27 mg/dL (ref 8–27)
Bilirubin Total: 0.7 mg/dL (ref 0.0–1.2)
CO2: 23 mmol/L (ref 20–29)
Calcium: 9.6 mg/dL (ref 8.6–10.2)
Chloride: 102 mmol/L (ref 96–106)
Creatinine, Ser: 1.22 mg/dL (ref 0.76–1.27)
Globulin, Total: 2.8 g/dL (ref 1.5–4.5)
Glucose: 136 mg/dL — ABNORMAL HIGH (ref 65–99)
Potassium: 5 mmol/L (ref 3.5–5.2)
Sodium: 138 mmol/L (ref 134–144)
Total Protein: 7.4 g/dL (ref 6.0–8.5)
eGFR: 64 mL/min/{1.73_m2} (ref >59)

## 2021-07-24 LAB — CBC WITH DIFFERENTIAL/PLATELET
Basophils Absolute: 0.1 10*3/uL (ref 0.0–0.2)
Basos: 1 %
EOS (ABSOLUTE): 0.3 10*3/uL (ref 0.0–0.4)
Eos: 4 %
Hematocrit: 41.6 % (ref 37.5–51.0)
Hemoglobin: 12.7 g/dL — ABNORMAL LOW (ref 13.0–17.7)
Immature Grans (Abs): 0 10*3/uL (ref 0.0–0.1)
Immature Granulocytes: 0 %
Lymphocytes Absolute: 1.5 10*3/uL (ref 0.7–3.1)
Lymphs: 25 %
MCH: 23.3 pg — ABNORMAL LOW (ref 26.6–33.0)
MCHC: 30.5 g/dL — ABNORMAL LOW (ref 31.5–35.7)
MCV: 77 fL — ABNORMAL LOW (ref 79–97)
Monocytes Absolute: 0.5 10*3/uL (ref 0.1–0.9)
Monocytes: 9 %
Neutrophils Absolute: 3.7 10*3/uL (ref 1.4–7.0)
Neutrophils: 61 %
Platelets: 333 10*3/uL (ref 150–450)
RBC: 5.44 x10E6/uL (ref 4.14–5.80)
RDW: 17.1 % — ABNORMAL HIGH (ref 11.6–15.4)
WBC: 6.1 10*3/uL (ref 3.4–10.8)

## 2021-07-24 LAB — LIPID PANEL
Chol/HDL Ratio: 3.9 ratio (ref 0.0–5.0)
Cholesterol, Total: 131 mg/dL (ref 100–199)
HDL: 34 mg/dL — ABNORMAL LOW (ref >39)
LDL Chol Calc (NIH): 75 mg/dL (ref 0–99)
Triglycerides: 118 mg/dL (ref 0–149)
VLDL Cholesterol Cal: 22 mg/dL (ref 5–40)

## 2021-07-24 LAB — BAYER DCA HB A1C WAIVED: HB A1C (BAYER DCA - WAIVED): 7.8 % — ABNORMAL HIGH (ref <7.0)

## 2021-07-25 LAB — MICROALBUMIN / CREATININE URINE RATIO
Creatinine, Urine: 71.6 mg/dL
Microalb/Creat Ratio: 15 mg/g creat (ref 0–29)
Microalbumin, Urine: 10.7 ug/mL

## 2021-07-28 DIAGNOSIS — Z9189 Other specified personal risk factors, not elsewhere classified: Secondary | ICD-10-CM

## 2021-07-28 NOTE — Progress Notes (Signed)
[facility])                                            [name]                                        Statin Quality Measure Assessment    07/28/2021  Pernell [place] 1951-02-27 [id number]  Dr. Warrick Parisian,   I am a [facility] clinical pharmacist that reviews patients for statin quality initiatives.     Per review of chart and payor information, patient has a diagnosis of diabetes but is not currently filling a statin prescription.  This places patient into the SUPD (Statin Use In Patients with Diabetes) measure for CMS.    I could not find any documentation of previous trial of a statin or a history of statin intolerance.   The 10-year ASCVD risk score Mikey Bussing DC Brooke Bonito., et al., 2013) is: 34.8%   Values used to calculate the score:     Age: 70 years     Sex: Male     Is Non-Hispanic African American: No     Diabetic: Yes     Tobacco smoker: No     Systolic Blood Pressure: [id number] mmHg     Is BP treated: Yes     HDL Cholesterol: 34 mg/dL     Total Cholesterol: 131 mg/dL 07/24/2021     Component Value Date/Time   CHOL 131 07/24/2021 0819   TRIG 118 07/24/2021 0819   HDL 34 (L) 07/24/2021 0819   CHOLHDL 3.9 07/24/2021 0819   CHOLHDL 4.1 09/20/2020 0921   VLDL 15 09/20/2020 0921   LDLCALC 75 07/24/2021 0819    Please consider ONE of the following recommendations:  Initiate high intensity statin Atorvastatin '40mg'$  once daily, #90, 3 refills   Rosuvastatin '20mg'$  once daily, #90, 3 refills    Initiate moderate intensity          statin with reduced frequency if prior          statin intolerance 1x weekly, #13, 3 refills   2x weekly, #26, 3 refills   3x weekly, #39, 3 refills    Code for past statin intolerance or  other exclusions (required annually)  Provider Requirements: Associate code during an office visit or telehealth encounter  Drug Induced Myopathy G72.0   Myopathy, unspecified G72.9   Myositis,  unspecified M60.9   Rhabdomyolysis [id number]   Alcoholic fatty liver 99991111   Cirrhosis of liver K74.69   Prediabetes R73.03   PCOS E28.2   Toxic liver disease, unspecified K71.9   Adverse effect of antihyperlipidemic and antiarteriosclerotic drugs, initial encounter T46.6X5A    Please let us know your decision.    Thank you!   Reed Breech, PharmD Clinical Pharmacist  [place] [phone]

## 2021-07-29 ENCOUNTER — Ambulatory Visit (INDEPENDENT_AMBULATORY_CARE_PROVIDER_SITE_OTHER): Payer: Medicare Other | Admitting: Family Medicine

## 2021-07-29 ENCOUNTER — Encounter: Payer: Self-pay | Admitting: Family Medicine

## 2021-07-29 ENCOUNTER — Other Ambulatory Visit: Payer: Self-pay

## 2021-07-29 VITALS — BP 129/71 | HR 59 | Ht 71.0 in | Wt 265.0 lb

## 2021-07-29 DIAGNOSIS — E1159 Type 2 diabetes mellitus with other circulatory complications: Secondary | ICD-10-CM

## 2021-07-29 DIAGNOSIS — I152 Hypertension secondary to endocrine disorders: Secondary | ICD-10-CM

## 2021-07-29 DIAGNOSIS — E669 Obesity, unspecified: Secondary | ICD-10-CM

## 2021-07-29 DIAGNOSIS — E1169 Type 2 diabetes mellitus with other specified complication: Secondary | ICD-10-CM | POA: Diagnosis not present

## 2021-07-29 DIAGNOSIS — G4709 Other insomnia: Secondary | ICD-10-CM

## 2021-07-29 MED ORDER — HYDROCHLOROTHIAZIDE 25 MG PO TABS: 25.0000 mg | ORAL_TABLET | Freq: Every evening | ORAL | 3 refills | Status: DC | PRN

## 2021-07-29 MED ORDER — ROSUVASTATIN CALCIUM 5 MG PO TABS: 5.0000 mg | ORAL_TABLET | Freq: Every day | ORAL | 3 refills | Status: DC

## 2021-07-29 NOTE — Progress Notes (Signed)
BP 129/71   Pulse (!) 59   Ht 5' 11"  (1.803 m)   Wt 265 lb (120.2 kg)   SpO2 98%   BMI 36.96 kg/m    Subjective:   Patient ID: [name], male    DOB: 18-Jan-1951, 70 y.o.   MRN: [id number]  HPI: [name] is a 71 y.o. male presenting on 07/29/2021 for Medical Management of Chronic Issues, Hypertension, and Diabetes   HPI Type 2 diabetes mellitus Patient comes in today for recheck of his diabetes. Patient has been currently taking metformin and Jardiance. Patient is currently on an ACE inhibitor/ARB. Patient has seen an ophthalmologist this year. Patient denies any issues with their feet. The symptom started onset as an adult hypertension ARE RELATED TO DM   Hypertension Patient is currently on irbesartan and hydrochlorothiazide, and their blood pressure today is 129/71. Patient denies any lightheadedness or dizziness. Patient denies headaches, blurred vision, chest pains, shortness of breath, or weakness. Denies any side effects from medication and is content with current medication.   Patient has not had insomnia and wants to get hydroxyzine because it did help him sleep a little bit better.  He still takes gabapentin help with his shoulder pains.  He wants to continue forward with.  Relevant past medical, surgical, family and social history reviewed and updated as indicated. Interim medical history since our last visit reviewed. Allergies and medications reviewed and updated.  Review of Systems  Constitutional:  Negative for chills and fever.  Eyes:  Negative for visual disturbance.  Respiratory:  Negative for shortness of breath and wheezing.   Cardiovascular:  Negative for chest pain and leg swelling.  Musculoskeletal:  Negative for back pain and gait problem.  Skin:  Negative for rash.  Neurological:  Negative for dizziness, weakness and numbness.  All other systems reviewed and are negative.  Per HPI unless specifically indicated above   Allergies as of  07/29/2021       Reactions   Ramipril Cough        Medication List        Accurate as of July 29, 2021 11:36 AM. If you have any questions, ask your nurse or doctor.          aspirin EC 81 MG tablet Take 81 mg by mouth every evening.   CENTRUM SILVER 50+MEN PO Take 1 tablet by mouth daily. Every now and then   gabapentin 100 MG capsule Commonly known as: NEURONTIN Take 2 capsules (200 mg total) by mouth at bedtime.   glucose blood test strip Use to check blood glucose once daily at varying times.  Dispense Verio One Touch Test strips   hydrochlorothiazide 25 MG tablet Commonly known as: HYDRODIURIL Take 1 tablet (25 mg total) by mouth at bedtime as needed. Started by: Fransisca Kaufmann Demone Lyles, MD   irbesartan 150 MG tablet Commonly known as: AVAPRO Take 1 tablet (150 mg total) by mouth daily.   Jardiance 10 MG Tabs tablet Generic drug: empagliflozin TAKE 1 TABLET BY MOUTH DAILY BEFORE BREAKFAST   meloxicam 15 MG tablet Commonly known as: MOBIC Take 1 tablet (15 mg total) by mouth daily.   metFORMIN 1000 MG tablet Commonly known as: GLUCOPHAGE Take 1 tablet (1,000 mg total) by mouth 2 (two) times daily with a meal.   OneTouch Delica Lancets 38L Misc Use to check blood glucose once daily at varying times   PROBIOTIC PO Take 1 capsule by mouth 3 (three) times a week.   rosuvastatin  5 MG tablet Commonly known as: Crestor Take 1 tablet (5 mg total) by mouth at bedtime. Started by: Worthy Rancher, MD   tamsulosin 0.4 MG Caps capsule Commonly known as: FLOMAX Take 1 capsule (0.4 mg total) by mouth daily after supper.         Objective:   BP 129/71   Pulse (!) 59   Ht 5' 11"  (1.803 m)   Wt 265 lb (120.2 kg)   SpO2 98%   BMI 36.96 kg/m   Wt Readings from Last 3 Encounters:  07/29/21 265 lb (120.2 kg)  07/09/21 263 lb 11.2 oz (119.6 kg)  03/06/21 264 lb 6.4 oz (119.9 kg)    Physical Exam Vitals and nursing note reviewed.  Constitutional:       General: He is not in acute distress.    Appearance: He is well-developed. He is not diaphoretic.  Eyes:     General: No scleral icterus.    Conjunctiva/sclera: Conjunctivae normal.  Neck:     Thyroid: No thyromegaly.  Cardiovascular:     Rate and Rhythm: Normal rate and regular rhythm.     Heart sounds: Normal heart sounds. No murmur heard. Pulmonary:     Effort: Pulmonary effort is normal. No respiratory distress.     Breath sounds: Normal breath sounds. No wheezing.  Musculoskeletal:        General: Normal range of motion.     Cervical back: Neck supple.  Lymphadenopathy:     Cervical: No cervical adenopathy.  Skin:    General: Skin is warm and dry.     Findings: No rash.  Neurological:     Mental Status: He is alert and oriented to person, place, and time.     Coordination: Coordination normal.  Psychiatric:        Behavior: Behavior normal.    Results for orders placed or performed in visit on 07/24/21  Lipid panel  Result Value Ref Range   Cholesterol, Total 131 100 - 199 mg/dL   Triglycerides 118 0 - 149 mg/dL   HDL 34 (L) >39 mg/dL   VLDL Cholesterol Cal 22 5 - 40 mg/dL   LDL Chol Calc (NIH) 75 0 - 99 mg/dL   Chol/HDL Ratio 3.9 0.0 - 5.0 ratio  CMP14+EGFR  Result Value Ref Range   Glucose 136 (H) 65 - 99 mg/dL   BUN 27 8 - 27 mg/dL   Creatinine, Ser 1.22 0.76 - 1.27 mg/dL   eGFR 64 >59 mL/min/1.73   BUN/Creatinine Ratio 22 10 - 24   Sodium 138 134 - 144 mmol/L   Potassium 5.0 3.5 - 5.2 mmol/L   Chloride 102 96 - 106 mmol/L   CO2 23 20 - 29 mmol/L   Calcium 9.6 8.6 - 10.2 mg/dL   Total Protein 7.4 6.0 - 8.5 g/dL   Albumin 4.6 3.8 - 4.8 g/dL   Globulin, Total 2.8 1.5 - 4.5 g/dL   Albumin/Globulin Ratio 1.6 1.2 - 2.2   Bilirubin Total 0.7 0.0 - 1.2 mg/dL   Alkaline Phosphatase 74 44 - 121 IU/L   AST 25 0 - 40 IU/L   ALT 25 0 - 44 IU/L  CBC with Differential/Platelet  Result Value Ref Range   WBC 6.1 3.4 - 10.8 x10E3/uL   RBC 5.44 4.14 - 5.80  x10E6/uL   Hemoglobin 12.7 (L) 13.0 - 17.7 g/dL   Hematocrit 41.6 37.5 - 51.0 %   MCV 77 (L) 79 - 97 fL   MCH 23.3 (  L) 26.6 - 33.0 pg   MCHC 30.5 (L) 31.5 - 35.7 g/dL   RDW 17.1 (H) 11.6 - 15.4 %   Platelets 333 150 - 450 x10E3/uL   Neutrophils 61 Not Estab. %   Lymphs 25 Not Estab. %   Monocytes 9 Not Estab. %   Eos 4 Not Estab. %   Basos 1 Not Estab. %   Neutrophils Absolute 3.7 1.4 - 7.0 x10E3/uL   Lymphocytes Absolute 1.5 0.7 - 3.1 x10E3/uL   Monocytes Absolute 0.5 0.1 - 0.9 x10E3/uL   EOS (ABSOLUTE) 0.3 0.0 - 0.4 x10E3/uL   Basophils Absolute 0.1 0.0 - 0.2 x10E3/uL   Immature Granulocytes 0 Not Estab. %   Immature Grans (Abs) 0.0 0.0 - 0.1 x10E3/uL  Bayer DCA Hb A1c Waived  Result Value Ref Range   HB A1C (BAYER DCA - WAIVED) 7.8 (H) <7.0 %  Microalbumin / creatinine urine ratio  Result Value Ref Range   Creatinine, Urine 71.6 Not Estab. mg/dL   Microalbumin, Urine 10.7 Not Estab. ug/mL   Microalb/Creat Ratio 15 0 - 29 mg/g creat    Assessment & Plan:   Problem List Items Addressed This Visit       Cardiovascular and Mediastinum   Hypertension associated with diabetes ([place])   Relevant Medications   hydrochlorothiazide (HYDRODIURIL) 25 MG tablet   rosuvastatin (CRESTOR) 5 MG tablet     Endocrine   Diabetes mellitus type 2 in obese ([facility]) - Primary   Relevant Medications   rosuvastatin (CRESTOR) 5 MG tablet   Other Visit Diagnoses     Other insomnia           A1c up slightly, will give him hydroxyzine to help with his sleeping.  Patient is having some trouble with the Jardiance but when he figures out financially we can do either switching to [place] or increase the Jardiance, please continue with metformin. Follow up plan: Return in about 3 months (around 10/29/2021), or if symptoms worsen or fail to improve, for Diabetes and hypertension.  Counseling provided for all of the vaccine components No orders of the defined types were placed in this  encounter.   Caryl Pina, MD [facility] 07/29/2021, 11:36 AM

## 2021-08-27 ENCOUNTER — Ambulatory Visit (INDEPENDENT_AMBULATORY_CARE_PROVIDER_SITE_OTHER): Payer: Medicare Other | Admitting: Family Medicine

## 2021-08-27 ENCOUNTER — Encounter: Payer: Self-pay | Admitting: Family Medicine

## 2021-08-27 ENCOUNTER — Other Ambulatory Visit: Payer: Self-pay

## 2021-08-27 VITALS — BP 142/79 | HR 62 | Ht 71.0 in | Wt 268.0 lb

## 2021-08-27 DIAGNOSIS — L918 Other hypertrophic disorders of the skin: Secondary | ICD-10-CM

## 2021-08-27 NOTE — Progress Notes (Signed)
BP (!) 142/79   Pulse 62   Ht '5\' 11"'$  (1.803 m)   Wt 268 lb (121.6 kg)   SpO2 93%   BMI 37.38 kg/m    Subjective:   Patient ID: [name], male    DOB: 1951/01/04, 70 y.o.   MRN: [id number]  HPI: Jawuan Heagerty is a 70 y.o. male presenting on 08/27/2021 for Skin Tag (axilla)   HPI Patient is coming in for some skin tags under both axillas that have been bothering him and get irritated inflamed and sometimes bleed and he wants to have them removed.  Relevant past medical, surgical, family and social history reviewed and updated as indicated. Interim medical history since our last visit reviewed. Allergies and medications reviewed and updated.  Review of Systems  Skin:  Negative for color change, rash and wound.   Per HPI unless specifically indicated above   Allergies as of 08/27/2021       Reactions   Ramipril Cough        Medication List        Accurate as of August 27, 2021 11:57 AM. If you have any questions, ask your nurse or doctor.          STOP taking these medications    hydrochlorothiazide 25 MG tablet Commonly known as: HYDRODIURIL Stopped by: Fransisca Kaufmann Aireanna Luellen, MD   Jardiance 10 MG Tabs tablet Generic drug: empagliflozin Stopped by: Worthy Rancher, MD   meloxicam 15 MG tablet Commonly known as: MOBIC Stopped by: Fransisca Kaufmann Anaiya Wisinski, MD       TAKE these medications    aspirin EC 81 MG tablet Take 81 mg by mouth every evening.   CENTRUM SILVER 50+MEN PO Take 1 tablet by mouth daily. Every now and then   gabapentin 100 MG capsule Commonly known as: NEURONTIN Take 2 capsules (200 mg total) by mouth at bedtime.   glucose blood test strip Use to check blood glucose once daily at varying times.  Dispense Verio One Touch Test strips   hydrOXYzine 25 MG tablet Commonly known as: ATARAX/VISTARIL Take 25 mg by mouth as needed. Cuts in half as needed   irbesartan 150 MG tablet Commonly known as: AVAPRO Take 1 tablet (150  mg total) by mouth daily.   metFORMIN 1000 MG tablet Commonly known as: GLUCOPHAGE Take 1 tablet (1,000 mg total) by mouth 2 (two) times daily with a meal.   OneTouch Delica Lancets 99991111 Misc Use to check blood glucose once daily at varying times   PROBIOTIC PO Take 1 capsule by mouth 3 (three) times a week.   rosuvastatin 5 MG tablet Commonly known as: Crestor Take 1 tablet (5 mg total) by mouth at bedtime.   tamsulosin 0.4 MG Caps capsule Commonly known as: FLOMAX Take 1 capsule (0.4 mg total) by mouth daily after supper.         Objective:   BP (!) 142/79   Pulse 62   Ht '5\' 11"'$  (1.803 m)   Wt 268 lb (121.6 kg)   SpO2 93%   BMI 37.38 kg/m   Wt Readings from Last 3 Encounters:  08/27/21 268 lb (121.6 kg)  07/29/21 265 lb (120.2 kg)  07/09/21 263 lb 11.2 oz (119.6 kg)    Physical Exam Vitals and nursing note reviewed.  Constitutional:      Appearance: Normal appearance.  Skin:    Findings: Lesion (Multiple skin tags under left axilla that are smaller and then 2 large ones on the  right axilla) present.  Neurological:     Mental Status: He is alert.    Procedure: Skin tag removal Informed consent:  Discussed risks (permanent scarring, infection, pain, bleeding, bruising, redness, and recurrence of the lesion) and benefits of the procedure, as well as the alternatives.  He is aware that skin tags are benign lesions, and their removal is often not considered medically necessary.  Informed consent was obtained. Anesthesia: None The area was prepared and draped in a standard fashion. Snip removal was performed.   Antibiotic ointment and a sterile dressing were applied.   The patient tolerated procedure well. The patient was instructed on post-op care.   Number of lesions removed:  2 from right axilla and 6 from left axilla  Assessment & Plan:   Problem List Items Addressed This Visit   None Visit Diagnoses     Inflamed skin tag    -  Primary         Follow up plan: Return if symptoms worsen or fail to improve.  Counseling provided for all of the vaccine components No orders of the defined types were placed in this encounter.   Caryl Pina, MD [facility] 08/27/2021, 11:57 AM

## 2021-08-28 ENCOUNTER — Ambulatory Visit: Payer: Medicare Other | Admitting: Podiatry

## 2021-09-03 ENCOUNTER — Inpatient Hospital Stay (HOSPITAL_COMMUNITY): Payer: Medicare Other | Attending: Hematology

## 2021-09-03 ENCOUNTER — Inpatient Hospital Stay (HOSPITAL_COMMUNITY): Payer: Medicare Other

## 2021-09-03 ENCOUNTER — Other Ambulatory Visit: Payer: Self-pay

## 2021-09-03 DIAGNOSIS — D751 Secondary polycythemia: Secondary | ICD-10-CM

## 2021-09-03 LAB — CBC WITH DIFFERENTIAL/PLATELET
Abs Immature Granulocytes: 0.02 10*3/uL (ref 0.00–0.07)
Basophils Absolute: 0.1 10*3/uL (ref 0.0–0.1)
Basophils Relative: 1 %
Eosinophils Absolute: 0.3 10*3/uL (ref 0.0–0.5)
Eosinophils Relative: 4 %
HCT: 42.4 % (ref 39.0–52.0)
Hemoglobin: 13 g/dL (ref 13.0–17.0)
Immature Granulocytes: 0 %
Lymphocytes Relative: 24 %
Lymphs Abs: 1.6 10*3/uL (ref 0.7–4.0)
MCH: 24.2 pg — ABNORMAL LOW (ref 26.0–34.0)
MCHC: 30.7 g/dL (ref 30.0–36.0)
MCV: 78.8 fL — ABNORMAL LOW (ref 80.0–100.0)
Monocytes Absolute: 0.6 10*3/uL (ref 0.1–1.0)
Monocytes Relative: 9 %
Neutro Abs: 4 10*3/uL (ref 1.7–7.7)
Neutrophils Relative %: 62 %
Platelets: 261 10*3/uL (ref 150–400)
RBC: 5.38 MIL/uL (ref 4.22–5.81)
RDW: 18.3 % — ABNORMAL HIGH (ref 11.5–15.5)
WBC: 6.4 10*3/uL (ref 4.0–10.5)
nRBC: 0 % (ref 0.0–0.2)

## 2021-09-03 NOTE — Progress Notes (Signed)
[name] presents today for phlebotomy per MD orders. Hgb today was 13.0 and hematocrit was 42.4. Phlebotomy procedure started at 1349 and ended at 1353. 500 mL removed. Patient refused 30 minute wait time post procedure.   Patient tolerated procedure well. Patient denies any dizziness or lightheadedness. IV needle removed intact. Post procedure vital signs were stable.

## 2021-09-03 NOTE — Patient Instructions (Signed)
[facility]  Discharge Instructions: Thank you for choosing [facility] to provide your oncology and hematology care.  If you have a lab appointment with the Cancer Center, please come in thru the Main Entrance and check in at the main information desk.  Wear comfortable clothing and clothing appropriate for easy access to any Portacath or PICC line.   We strive to give you quality time with your provider. You may need to reschedule your appointment if you arrive late (15 or more minutes).  Arriving late affects you and other patients whose appointments are after yours.  Also, if you miss three or more appointments without notifying the office, you may be dismissed from the clinic at the provider's discretion.      For prescription refill requests, have your pharmacy contact our office and allow 72 hours for refills to be completed.        To help prevent nausea and vomiting after your treatment, we encourage you to take your nausea medication as directed.  BELOW ARE SYMPTOMS THAT SHOULD BE REPORTED IMMEDIATELY: *FEVER GREATER THAN 100.4 F (38 C) OR HIGHER *CHILLS OR SWEATING *NAUSEA AND VOMITING THAT IS NOT CONTROLLED WITH YOUR NAUSEA MEDICATION *UNUSUAL SHORTNESS OF BREATH *UNUSUAL BRUISING OR BLEEDING *URINARY PROBLEMS (pain or burning when urinating, or frequent urination) *BOWEL PROBLEMS (unusual diarrhea, constipation, pain near the anus) TENDERNESS IN MOUTH AND THROAT WITH OR WITHOUT PRESENCE OF ULCERS (sore throat, sores in mouth, or a toothache) UNUSUAL RASH, SWELLING OR PAIN  UNUSUAL VAGINAL DISCHARGE OR ITCHING   Items with * indicate a potential emergency and should be followed up as soon as possible or go to the Emergency Department if any problems should occur.  Please show the CHEMOTHERAPY ALERT CARD or IMMUNOTHERAPY ALERT CARD at check-in to the Emergency Department and triage nurse.  Should you have questions after your visit or need to cancel  or reschedule your appointment, please contact [facility] [phone]  and follow the prompts.  Office hours are 8:00 a.m. to 4:30 p.m. Monday - Friday. Please note that voicemails left after 4:00 p.m. may not be returned until the following business day.  We are closed weekends and major holidays. You have access to a nurse at all times for urgent questions. Please call the main number to the clinic [phone] and follow the prompts.  For any non-urgent questions, you may also contact your provider using MyChart. We now offer e-Visits for anyone 18 and older to request care online for non-urgent symptoms. For details visit [email or web address].   Also download the MyChart app! Go to the app store, search "MyChart", open the app, select [facility], and log in with your MyChart username and password.  Due to Covid, a mask is required upon entering the hospital/clinic. If you do not have a mask, one will be given to you upon arrival. For doctor visits, patients may have 1 support person aged 18 or older with them. For treatment visits, patients cannot have anyone with them due to current Covid guidelines and our immunocompromised population.  

## 2021-09-15 ENCOUNTER — Ambulatory Visit (INDEPENDENT_AMBULATORY_CARE_PROVIDER_SITE_OTHER): Payer: Medicare Other

## 2021-09-15 VITALS — Ht 71.0 in | Wt 268.0 lb

## 2021-09-15 DIAGNOSIS — Z0000 Encounter for general adult medical examination without abnormal findings: Secondary | ICD-10-CM

## 2021-09-15 NOTE — Progress Notes (Signed)
Subjective:   [name] is a 70 y.o. male who presents for Medicare Annual/Subsequent preventive examination.  Virtual Visit via Telephone Note  I connected with  [name] on 09/15/21 at 10:30 AM EDT by telephone and verified that I am speaking with the correct person using two identifiers.  Location: Patient: Home Provider: [facility] Persons participating in the virtual visit: patient/Nurse Health Advisor   I discussed the limitations, risks, security and privacy concerns of performing an evaluation and management service by telephone and the availability of in person appointments. The patient expressed understanding and agreed to proceed.  Interactive audio and video telecommunications were attempted between this nurse and patient, however failed, due to patient having technical difficulties OR patient did not have access to video capability.  We continued and completed visit with audio only.  Some vital signs may be absent or patient reported.   Veyda Kaufman E Anndrea Mihelich, LPN   Review of Systems     Cardiac Risk Factors include: advanced age (>70men, >44 women);diabetes mellitus;dyslipidemia;hypertension;male gender;obesity (BMI >30kg/m2);Other (see comment), Risk factor comments: OSA, Polycythemia     Objective:    Today's Vitals   09/15/21 1034 09/15/21 1035  Weight: 268 lb (121.6 kg)   Height: 5\' 11"  (1.803 m)   PainSc:  4    Body mass index is 37.38 kg/m.  Advanced Directives 09/15/2021 09/03/2021 07/09/2021 07/09/2021 05/06/2021 03/06/2021 01/07/2021  Does Patient Have a Medical Advance Directive? Yes Yes Yes Yes No No No  Type of Paramedic of [place];Living will Living will;Healthcare Power of Attorney Living will Living will - - -  Does patient want to make changes to medical advance directive? - No - Patient declined No - Patient declined No - Patient declined - - -  Copy of [place] in Chart? No - copy requested No - copy  requested No - copy requested No - copy requested - - -  Would patient like information on creating a medical advance directive? - No - Patient declined - - No - Patient declined No - Patient declined No - Patient declined    Current Medications (verified) Outpatient Encounter Medications as of 09/15/2021  Medication Sig   aspirin EC 81 MG tablet Take 81 mg by mouth every evening.   gabapentin (NEURONTIN) 100 MG capsule Take 2 capsules (200 mg total) by mouth at bedtime.   glucose blood test strip Use to check blood glucose once daily at varying times.  Dispense Verio One Touch Test strips   hydrOXYzine (ATARAX/VISTARIL) 25 MG tablet Take 25 mg by mouth as needed. Cuts in half as needed   irbesartan (AVAPRO) 150 MG tablet Take 1 tablet (150 mg total) by mouth daily.   metFORMIN (GLUCOPHAGE) 1000 MG tablet Take 1 tablet (1,000 mg total) by mouth 2 (two) times daily with a meal.   Multiple Vitamins-Minerals (CENTRUM SILVER 50+MEN PO) Take 1 tablet by mouth daily. Every now and then   [facility] LANCETS 00X MISC Use to check blood glucose once daily at varying times   Probiotic Product (PROBIOTIC PO) Take 1 capsule by mouth 3 (three) times a week.    rosuvastatin (CRESTOR) 5 MG tablet Take 1 tablet (5 mg total) by mouth at bedtime.   tamsulosin (FLOMAX) 0.4 MG CAPS capsule Take 1 capsule (0.4 mg total) by mouth daily after supper.   No facility-administered encounter medications on file as of 09/15/2021.    Allergies (verified) Ramipril   History: Past Medical History:  Diagnosis Date   Diabetes mellitus without complication ([place])    GERD (gastroesophageal reflux disease)    Hx of [place] spotted fever [id number]   Hypertension    Sleep apnea    wears cpap    Past Surgical History:  Procedure Laterality Date   COLONOSCOPY  09/2012   Dr. Britta Mccreedy. Diverticulosis, descending adenomatous colon polyp, hyperplastic rectal polyp. ?mass in anal canal evaluated by surgery and  consistent with internal hemorrhoid   COLONOSCOPY WITH PROPOFOL N/A 02/07/2019   Procedure: COLONOSCOPY WITH PROPOFOL;  Surgeon: Danie Binder, MD;  [facility];  Service: Endoscopy;  Laterality: N/A;  9:30am   KNEE SURGERY Left 10/2014   KNEE SURGERY Right 09/2015   POLYPECTOMY  02/07/2019   Procedure: POLYPECTOMY;  Surgeon: Danie Binder, MD;  Location: [facility];  Service: Endoscopy;;   Family History  Problem Relation Age of Onset   Heart disease Mother    Congestive Heart Failure Mother    Diabetes Father    Aneurysm Sister    Diabetes Paternal Uncle    Diabetes Paternal Grandmother    Colon cancer Neg Hx    Colon polyps Neg Hx    Social History   Socioeconomic History   Marital status: Single    Spouse name: Not on file   Number of children: 0   Years of education: 12   Highest education level: 12th grade  Occupational History   Occupation: Retired    Comment: Pensions consultant and gam[employer]  Tobacco Use   Smoking status: Never   Smokeless tobacco: Never  Vaping Use   Vaping Use: Never used  Substance and Sexual Activity   Alcohol use: No   Drug use: No   Sexual activity: Not Currently  Other Topics Concern   Not on file  Social History Narrative   WORKED AT [place] > [place]. FUR BABIES: 3 DOGS AND 5 CATS. SPENDS FREE TIME: [place], [place], [place].   Social Determinants of Health   Financial Resource Strain: Low Risk    Difficulty of Paying Living Expenses: Not hard at all  Food Insecurity: No Food Insecurity   Worried About Charity fundraiser in the Last Year: Never true   [place] in the Last Year: Never true  Transportation Needs: No Transportation Needs   Lack of Transportation (Medical): No   Lack of Transportation (Non-Medical): No  Physical Activity: Sufficiently Active   Days of Exercise per Week: 7 days   Minutes of Exercise per Session: 30 min  Stress: No Stress Concern Present   Feeling of Stress  : Not at all  Social Connections: Moderately Integrated   Frequency of Communication with Friends and Family: More than three times a week   Frequency of Social Gatherings with Friends and Family: More than three times a week   Attends Religious Services: More than 4 times per year   Active Member of [employer] or Organizations: Yes   Attends Music therapist: More than 4 times per year   Marital Status: Never married    Tobacco Counseling Counseling given: Not Answered   Clinical Intake:  Pre-visit preparation completed: Yes  Pain : 0-10 Pain Score: 4  Pain Type: Chronic pain Pain Location: Knee Pain Orientation: Right, Left Pain Descriptors / Indicators: Aching, Discomfort, Tender, Sore Pain Onset: More than a month ago Pain Frequency: Intermittent     BMI - recorded: 37.38 Nutritional Status: BMI >  30  Obese Nutritional Risks: None Diabetes: Yes CBG done?: No Did pt. bring in CBG monitor from home?: No  How often do you need to have someone help you when you read instructions, pamphlets, or other written materials from your doctor or pharmacy?: 1 - Never  Diabetic? Nutrition Risk Assessment:  Has the patient had any N/V/D within the last 2 months?  No  Does the patient have any non-healing wounds?  No  Has the patient had any unintentional weight loss or weight gain?  No   Diabetes:  Is the patient diabetic?  Yes  If diabetic, was a CBG obtained today?  No  Did the patient bring in their glucometer from home?  No  How often do you monitor your CBG's? None at this time.   Financial Strains and Diabetes Management:  Are you having any financial strains with the device, your supplies or your medication? No .  Does the patient want to be seen by Chronic Care Management for management of their diabetes?  No  Would the patient like to be referred to a Nutritionist or for Diabetic Management?  No   Diabetic Exams:  Diabetic Eye Exam: Completed 09/17/2020.    Diabetic Foot Exam: Completed 09/08/2019. Pt has been advised about the importance in completing this exam. Pt is scheduled for diabetic foot exam on 11/2021.    Interpreter Needed?: No  Information entered by :: Leandrew Keech, LPN   Activities of Daily Living In your present state of health, do you have any difficulty performing the following activities: 09/15/2021  Hearing? N  Vision? N  Difficulty concentrating or making decisions? N  Walking or climbing stairs? Y  Comment hurts knees, but he can do this fine - his bedroom is upstairs  Dressing or bathing? N  Doing errands, shopping? N  Preparing Food and eating ? N  Using the Toilet? N  In the past six months, have you accidently leaked urine? N  Do you have problems with loss of bowel control? N  Managing your Medications? N  Managing your Finances? N  Housekeeping or managing your Housekeeping? N  Some recent data might be hidden    Patient Care Team: Dettinger, Fransisca Kaufmann, MD as PCP - General (Family Medicine) Glennie Isle, NP-C (Inactive) as Nurse Practitioner (Hematology and Oncology) Franchot Gallo, MD as Consulting Physician (Urology)  Indicate any recent Medical Services you may have received from other than [facility] providers in the past year (date may be approximate).     Assessment:   This is a routine wellness examination for [name].  Hearing/Vision screen Hearing Screening - Comments:: C/o mild hearing difficulties - declines hearing aids Vision Screening - Comments:: Wears reading glasses prn only - up to date with annual eye exams with Dr Rosana Hoes in [place]  Dietary issues and exercise activities discussed: Current Exercise Habits: Home exercise routine, Type of exercise: walking, Time (Minutes): 30, Frequency (Times/Week): 7, Weekly Exercise (Minutes/Week): 210, Intensity: Mild, Exercise limited by: orthopedic condition(s)   Goals Addressed             This Visit's Progress    DIET - INCREASE WATER  INTAKE   On track    Try to drink 6-8 glasses of water daily.       Depression Screen PHQ 2/9 Scores 09/15/2021 08/27/2021 07/29/2021 09/27/2020 09/13/2020 09/13/2019 09/08/2019  PHQ - 2 Score 1 1 0 0 0 0 0  PHQ- 9 Score 4 4 - - - - -  Fall Risk Fall Risk  09/15/2021 08/27/2021 07/29/2021 09/27/2020 09/13/2020  Falls in the past year? 0 0 0 0 0  Number falls in past yr: 0 - - - -  Injury with Fall? 0 - - - -  Risk for fall due to : Medication side effect;Orthopedic patient - - - -  Follow up Falls prevention discussed - - - -  Comment - - - - -    FALL RISK PREVENTION PERTAINING TO THE HOME:  Any stairs in or around the home? Yes  If so, are there any without handrails? No  Home free of loose throw rugs in walkways, pet beds, electrical cords, etc? Yes  Adequate lighting in your home to reduce risk of falls? Yes   ASSISTIVE DEVICES UTILIZED TO PREVENT FALLS:  Life alert? No  Use of a cane, walker or w/c? No  Grab bars in the bathroom? Yes  Shower chair or bench in shower? Yes  Elevated toilet seat or a handicapped toilet? Yes   TIMED UP AND GO:  Was the test performed? No . Telephonic visit  Cognitive Function: Normal cognitive status assessed by direct observation by this Nurse Health Advisor. No abnormalities found.    MMSE - Mini Mental State Exam 05/24/2018  Orientation to time 5  Orientation to Place 5  Registration 3  Attention/ Calculation 5  Recall 3  Language- name 2 objects 2  Language- repeat 1  Language- follow 3 step command 3  Language- read & follow direction 1  Write a sentence 1  Copy design 1  Total score 30     6CIT Screen 09/13/2020 09/13/2019  What Year? 0 points 0 points  What month? 0 points 0 points  What time? - 0 points  Count back from 20 - 0 points  Months in reverse - 0 points  Repeat phrase - 2 points  Total Score - 2    Immunizations Immunization History  Administered Date(s) Administered   Fluad Quad(high Dose 65+) 10/10/2019,  08/20/2020   Influenza,inj,Quad PF,6+ Mos 09/25/2014, 10/22/2016, 09/22/2017, 09/23/2018   Influenza-Unspecified 08/20/2020   Moderna Sars-Covid-2 Vaccination 01/25/2020, 02/22/2020   Pneumococcal Conjugate-13 09/27/2020   Tdap 09/25/2014   Zoster Recombinat (Shingrix) 04/20/2018    TDAP status: Up to date  Flu Vaccine status: Due, Education has been provided regarding the importance of this vaccine. Advised may receive this vaccine at local pharmacy or Health Dept. Aware to provide a copy of the vaccination record if obtained from local pharmacy or Health Dept. Verbalized acceptance and understanding.  Pneumococcal vaccine status: Due, Education has been provided regarding the importance of this vaccine. Advised may receive this vaccine at local pharmacy or Health Dept. Aware to provide a copy of the vaccination record if obtained from local pharmacy or Health Dept. Verbalized acceptance and understanding.  Covid-19 vaccine status: Information provided on how to obtain vaccines.   Qualifies for Shingles Vaccine? Yes   Zostavax completed No   Shingrix Completed?: No.    Education has been provided regarding the importance of this vaccine. Patient has been advised to call insurance company to determine out of pocket expense if they have not yet received this vaccine. Advised may also receive vaccine at local pharmacy or Health Dept. Verbalized acceptance and understanding.  Screening Tests Health Maintenance  Topic Date Due   INFLUENZA VACCINE  10/14/2021 (Originally 07/14/2021)   COVID-19 Vaccine (3 - Moderna risk series) 01/26/2022 (Originally 03/21/2020)   Zoster Vaccines- Shingrix (2 of 2) 01/26/2022 (Originally  06/15/2018)   FOOT EXAM  07/29/2022 (Originally 09/07/2020)   OPHTHALMOLOGY EXAM  09/17/2021   HEMOGLOBIN A1C  01/24/2022   TETANUS/TDAP  09/25/2024   COLONOSCOPY (Pts 45-30yrs Insurance coverage will need to be confirmed)  02/07/2029   Hepatitis C Screening  Completed   HPV  VACCINES  Aged Out    Health Maintenance  There are no preventive care reminders to display for this patient.  Colorectal cancer screening: Type of screening: Colonoscopy. Completed 02/07/2019. Repeat every 10 years  Lung Cancer Screening: (Low Dose CT Chest recommended if Age 55-80 years, 30 pack-year currently smoking OR have quit w/in 15years.) does not qualify.   Additional Screening:  Hepatitis C Screening: does qualify; Completed 01/24/2019  Vision Screening: Recommended annual ophthalmology exams for early detection of glaucoma and other disorders of the eye. Is the patient up to date with their annual eye exam?  Yes  Who is the provider or what is the name of the office in which the patient attends annual eye exams? [facility] If pt is not established with a provider, would they like to be referred to a provider to establish care? No .   Dental Screening: Recommended annual dental exams for proper oral hygiene  Community Resource Referral / Chronic Care Management: CRR required this visit?  No   CCM required this visit?  No      Plan:     I have personally reviewed and noted the following in the patient's chart:   Medical and social history Use of alcohol, tobacco or illicit drugs  Current medications and supplements including opioid prescriptions. Patient is not currently taking opioid prescriptions. Functional ability and status Nutritional status Physical activity Advanced directives List of other physicians Hospitalizations, surgeries, and ER visits in previous 12 months Vitals Screenings to include cognitive, depression, and falls Referrals and appointments  In addition, I have reviewed and discussed with patient certain preventive protocols, quality metrics, and best practice recommendations. A written personalized care plan for preventive services as well as general preventive health recommendations were provided to patient.     Sandrea Hammond,  LPN   [id number]   Nurse Notes: None

## 2021-09-15 NOTE — Patient Instructions (Signed)
[name] , Thank you for taking time to come for your Medicare Wellness Visit. I appreciate your ongoing commitment to your health goals. Please review the following plan we discussed and let me know if I can assist you in the future.   Screening recommendations/referrals: Colonoscopy: Done 02/07/2019 - Repeat in 10 years Recommended yearly ophthalmology/optometry visit for glaucoma screening and checkup Recommended yearly dental visit for hygiene and checkup  Vaccinations: Influenza vaccine: Done 08/20/2020 - Repeat annually (appointment 10/26 @ 8:45) Pneumococcal vaccine: Prevnar Done 09/27/2020 - Due for Pneumovax (get with flu shot) Tdap vaccine: Done 09/25/2014 - Repeat in 10 years Shingles vaccine: Due. Shingrix discussed. Please contact your pharmacy for coverage information.     Covid-19: Done 01/25/2020 & 02/22/2020 - due for booster  Advanced directives: Please bring a copy of your health care power of attorney and living will to the office to be added to your chart at your convenience.   Conditions/risks identified: Aim for 30 minutes of exercise or brisk walking each day, drink 6-8 glasses of water and eat lots of fruits and vegetables.   Next appointment: Follow up in one year for your annual wellness visit.   Preventive Care 69 Years and Older, Male  Preventive care refers to lifestyle choices and visits with your health care provider that can promote health and wellness. What does preventive care include? A yearly physical exam. This is also called an annual well check. Dental exams once or twice a year. Routine eye exams. Ask your health care provider how often you should have your eyes checked. Personal lifestyle choices, including: Daily care of your teeth and gums. Regular physical activity. Eating a healthy diet. Avoiding tobacco and drug use. Limiting alcohol use. Practicing safe sex. Taking low doses of aspirin every day. Taking vitamin and mineral  supplements as recommended by your health care provider. What happens during an annual well check? The services and screenings done by your health care provider during your annual well check will depend on your age, overall health, lifestyle risk factors, and family history of disease. Counseling  Your health care provider may ask you questions about your: Alcohol use. Tobacco use. Drug use. Emotional well-being. Home and relationship well-being. Sexual activity. Eating habits. History of falls. Memory and ability to understand (cognition). Work and work Statistician. Screening  You may have the following tests or measurements: Height, weight, and BMI. Blood pressure. Lipid and cholesterol levels. These may be checked every 5 years, or more frequently if you are over 42 years old. Skin check. Lung cancer screening. You may have this screening every year starting at age 26 if you have a 30-pack-year history of smoking and currently smoke or have quit within the past 15 years. Fecal occult blood test (FOBT) of the stool. You may have this test every year starting at age 64. Flexible sigmoidoscopy or colonoscopy. You may have a sigmoidoscopy every 5 years or a colonoscopy every 10 years starting at age 76. Prostate cancer screening. Recommendations will vary depending on your family history and other risks. Hepatitis C blood test. Hepatitis B blood test. Sexually transmitted disease (STD) testing. Diabetes screening. This is done by checking your blood sugar (glucose) after you have not eaten for a while (fasting). You may have this done every 1-3 years. Abdominal aortic aneurysm (AAA) screening. You may need this if you are a current or former smoker. Osteoporosis. You may be screened starting at age 78 if you are at high risk. Talk with  your health care provider about your test results, treatment options, and if necessary, the need for more tests. Vaccines  Your health care provider  may recommend certain vaccines, such as: Influenza vaccine. This is recommended every year. Tetanus, diphtheria, and acellular pertussis (Tdap, Td) vaccine. You may need a Td booster every 10 years. Zoster vaccine. You may need this after age 57. Pneumococcal 13-valent conjugate (PCV13) vaccine. One dose is recommended after age 72. Pneumococcal polysaccharide (PPSV23) vaccine. One dose is recommended after age 48. Talk to your health care provider about which screenings and vaccines you need and how often you need them. This information is not intended to replace advice given to you by your health care provider. Make sure you discuss any questions you have with your health care provider. Document Released: 12/27/2015 Document Revised: 08/19/2016 Document Reviewed: 10/01/2015 Elsevier Interactive Patient Education  2017 [place] Prevention in the Home Falls can cause injuries. They can happen to people of all ages. There are many things you can do to make your home safe and to help prevent falls. What can I do on the outside of my home? Regularly fix the edges of walkways and driveways and fix any cracks. Remove anything that might make you trip as you walk through a door, such as a raised step or threshold. Trim any bushes or trees on the path to your home. Use bright outdoor lighting. Clear any walking paths of anything that might make someone trip, such as rocks or tools. Regularly check to see if handrails are loose or broken. Make sure that both sides of any steps have handrails. Any raised decks and porches should have guardrails on the edges. Have any leaves, snow, or ice cleared regularly. Use sand or salt on walking paths during winter. Clean up any spills in your garage right away. This includes oil or grease spills. What can I do in the bathroom? Use night lights. Install grab bars by the toilet and in the tub and shower. Do not use towel bars as grab bars. Use  non-skid mats or decals in the tub or shower. If you need to sit down in the shower, use a plastic, non-slip stool. Keep the floor dry. Clean up any water that spills on the floor as soon as it happens. Remove soap buildup in the tub or shower regularly. Attach bath mats securely with double-sided non-slip rug tape. Do not have throw rugs and other things on the floor that can make you trip. What can I do in the bedroom? Use night lights. Make sure that you have a light by your bed that is easy to reach. Do not use any sheets or blankets that are too big for your bed. They should not hang down onto the floor. Have a firm chair that has side arms. You can use this for support while you get dressed. Do not have throw rugs and other things on the floor that can make you trip. What can I do in the kitchen? Clean up any spills right away. Avoid walking on wet floors. Keep items that you use a lot in easy-to-reach places. If you need to reach something above you, use a strong step stool that has a grab bar. Keep electrical cords out of the way. Do not use floor polish or wax that makes floors slippery. If you must use wax, use non-skid floor wax. Do not have throw rugs and other things on the floor that can make you trip.  What can I do with my stairs? Do not leave any items on the stairs. Make sure that there are handrails on both sides of the stairs and use them. Fix handrails that are broken or loose. Make sure that handrails are as long as the stairways. Check any carpeting to make sure that it is firmly attached to the stairs. Fix any carpet that is loose or worn. Avoid having throw rugs at the top or bottom of the stairs. If you do have throw rugs, attach them to the floor with carpet tape. Make sure that you have a light switch at the top of the stairs and the bottom of the stairs. If you do not have them, ask someone to add them for you. What else can I do to help prevent falls? Wear  shoes that: Do not have high heels. Have rubber bottoms. Are comfortable and fit you well. Are closed at the toe. Do not wear sandals. If you use a stepladder: Make sure that it is fully opened. Do not climb a closed stepladder. Make sure that both sides of the stepladder are locked into place. Ask someone to hold it for you, if possible. Clearly mark and make sure that you can see: Any grab bars or handrails. First and last steps. Where the edge of each step is. Use tools that help you move around (mobility aids) if they are needed. These include: Canes. Walkers. Scooters. Crutches. Turn on the lights when you go into a dark area. Replace any light bulbs as soon as they burn out. Set up your furniture so you have a clear path. Avoid moving your furniture around. If any of your floors are uneven, fix them. If there are any pets around you, be aware of where they are. Review your medicines with your doctor. Some medicines can make you feel dizzy. This can increase your chance of falling. Ask your doctor what other things that you can do to help prevent falls. This information is not intended to replace advice given to you by your health care provider. Make sure you discuss any questions you have with your health care provider. Document Released: 09/26/2009 Document Revised: 05/07/2016 Document Reviewed: 01/04/2015 Elsevier Interactive Patient Education  2017 [employer].

## 2021-10-08 ENCOUNTER — Ambulatory Visit: Payer: Medicare Other

## 2021-10-14 ENCOUNTER — Ambulatory Visit (INDEPENDENT_AMBULATORY_CARE_PROVIDER_SITE_OTHER): Payer: Medicare Other

## 2021-10-14 ENCOUNTER — Other Ambulatory Visit: Payer: Self-pay

## 2021-10-14 DIAGNOSIS — Z23 Encounter for immunization: Secondary | ICD-10-CM

## 2021-10-14 NOTE — Addendum Note (Signed)
Addended by: Michaela Corner on: 10/14/2021 08:42 AM   Modules accepted: Orders

## 2021-10-29 ENCOUNTER — Other Ambulatory Visit: Payer: Self-pay

## 2021-10-29 ENCOUNTER — Inpatient Hospital Stay (HOSPITAL_COMMUNITY): Payer: Medicare Other

## 2021-10-29 ENCOUNTER — Inpatient Hospital Stay (HOSPITAL_COMMUNITY): Payer: Medicare Other | Attending: Hematology

## 2021-10-29 DIAGNOSIS — D751 Secondary polycythemia: Secondary | ICD-10-CM | POA: Diagnosis not present

## 2021-10-29 LAB — CBC WITH DIFFERENTIAL/PLATELET
Abs Immature Granulocytes: 0.02 10*3/uL (ref 0.00–0.07)
Basophils Absolute: 0.1 10*3/uL (ref 0.0–0.1)
Basophils Relative: 1 %
Eosinophils Absolute: 0.2 10*3/uL (ref 0.0–0.5)
Eosinophils Relative: 3 %
HCT: 43.9 % (ref 39.0–52.0)
Hemoglobin: 13.7 g/dL (ref 13.0–17.0)
Immature Granulocytes: 0 %
Lymphocytes Relative: 20 %
Lymphs Abs: 1.5 10*3/uL (ref 0.7–4.0)
MCH: 24.7 pg — ABNORMAL LOW (ref 26.0–34.0)
MCHC: 31.2 g/dL (ref 30.0–36.0)
MCV: 79.1 fL — ABNORMAL LOW (ref 80.0–100.0)
Monocytes Absolute: 0.6 10*3/uL (ref 0.1–1.0)
Monocytes Relative: 9 %
Neutro Abs: 4.9 10*3/uL (ref 1.7–7.7)
Neutrophils Relative %: 67 %
Platelets: 274 10*3/uL (ref 150–400)
RBC: 5.55 MIL/uL (ref 4.22–5.81)
RDW: 17 % — ABNORMAL HIGH (ref 11.5–15.5)
WBC: 7.2 10*3/uL (ref 4.0–10.5)
nRBC: 0 % (ref 0.0–0.2)

## 2021-10-29 NOTE — Patient Instructions (Signed)
[facility]  Discharge Instructions: Thank you for choosing [facility] to provide your oncology and hematology care.  If you have a lab appointment with the Cancer Center, please come in thru the Main Entrance and check in at the main information desk.  Wear comfortable clothing and clothing appropriate for easy access to any Portacath or PICC line.   We strive to give you quality time with your provider. You may need to reschedule your appointment if you arrive late (15 or more minutes).  Arriving late affects you and other patients whose appointments are after yours.  Also, if you miss three or more appointments without notifying the office, you may be dismissed from the clinic at the provider's discretion.      For prescription refill requests, have your pharmacy contact our office and allow 72 hours for refills to be completed.        To help prevent nausea and vomiting after your treatment, we encourage you to take your nausea medication as directed.  BELOW ARE SYMPTOMS THAT SHOULD BE REPORTED IMMEDIATELY: *FEVER GREATER THAN 100.4 F (38 C) OR HIGHER *CHILLS OR SWEATING *NAUSEA AND VOMITING THAT IS NOT CONTROLLED WITH YOUR NAUSEA MEDICATION *UNUSUAL SHORTNESS OF BREATH *UNUSUAL BRUISING OR BLEEDING *URINARY PROBLEMS (pain or burning when urinating, or frequent urination) *BOWEL PROBLEMS (unusual diarrhea, constipation, pain near the anus) TENDERNESS IN MOUTH AND THROAT WITH OR WITHOUT PRESENCE OF ULCERS (sore throat, sores in mouth, or a toothache) UNUSUAL RASH, SWELLING OR PAIN  UNUSUAL VAGINAL DISCHARGE OR ITCHING   Items with * indicate a potential emergency and should be followed up as soon as possible or go to the Emergency Department if any problems should occur.  Please show the CHEMOTHERAPY ALERT CARD or IMMUNOTHERAPY ALERT CARD at check-in to the Emergency Department and triage nurse.  Should you have questions after your visit or need to cancel  or reschedule your appointment, please contact [facility] [phone]  and follow the prompts.  Office hours are 8:00 a.m. to 4:30 p.m. Monday - Friday. Please note that voicemails left after 4:00 p.m. may not be returned until the following business day.  We are closed weekends and major holidays. You have access to a nurse at all times for urgent questions. Please call the main number to the clinic [phone] and follow the prompts.  For any non-urgent questions, you may also contact your provider using MyChart. We now offer e-Visits for anyone 18 and older to request care online for non-urgent symptoms. For details visit [email or web address].   Also download the MyChart app! Go to the app store, search "MyChart", open the app, select [facility], and log in with your MyChart username and password.  Due to Covid, a mask is required upon entering the hospital/clinic. If you do not have a mask, one will be given to you upon arrival. For doctor visits, patients may have 1 support person aged 18 or older with them. For treatment visits, patients cannot have anyone with them due to current Covid guidelines and our immunocompromised population.  

## 2021-10-29 NOTE — Progress Notes (Signed)
[name] presents for therapeutic phlebotomy per MD orders. Last HGB 13.7 / HCT 43.9 on 10/29/2021 . Vital signs stable prior to procedure. Procedure started at 1359 and ended at 1405. 500 mls of blood removed. Patient denies any dizziness , lightheadedness, or feeling faint.  Gauze and tape applied to site. Vital signs stable at completion of procedure. Patient has no complaints at this time. Alert and oriented x 3. Patient refused 30 minute post phlebotomy wait time.  Discharged ambulatory in stable condition.

## 2021-11-05 ENCOUNTER — Ambulatory Visit (INDEPENDENT_AMBULATORY_CARE_PROVIDER_SITE_OTHER): Payer: Medicare Other | Admitting: Family Medicine

## 2021-11-05 ENCOUNTER — Encounter: Payer: Self-pay | Admitting: Family Medicine

## 2021-11-05 ENCOUNTER — Other Ambulatory Visit: Payer: Self-pay

## 2021-11-05 ENCOUNTER — Telehealth: Payer: Self-pay

## 2021-11-05 VITALS — BP 121/70 | HR 88 | Ht 71.0 in | Wt 259.0 lb

## 2021-11-05 DIAGNOSIS — I152 Hypertension secondary to endocrine disorders: Secondary | ICD-10-CM | POA: Diagnosis not present

## 2021-11-05 DIAGNOSIS — E669 Obesity, unspecified: Secondary | ICD-10-CM

## 2021-11-05 DIAGNOSIS — E1159 Type 2 diabetes mellitus with other circulatory complications: Secondary | ICD-10-CM | POA: Diagnosis not present

## 2021-11-05 DIAGNOSIS — E1169 Type 2 diabetes mellitus with other specified complication: Secondary | ICD-10-CM

## 2021-11-05 LAB — BAYER DCA HB A1C WAIVED: HB A1C (BAYER DCA - WAIVED): >14 % — ABNORMAL HIGH (ref 4.8–5.6)

## 2021-11-05 MED ORDER — TRULICITY 3 MG/0.5ML ~~LOC~~ SOAJ: 3.0000 mg | SUBCUTANEOUS | 2 refills | Status: DC

## 2021-11-05 MED ORDER — TRULICITY 0.75 MG/0.5ML ~~LOC~~ SOAJ
0.7500 mg | SUBCUTANEOUS | 0 refills | Status: DC
Start: 2021-11-05 — End: 2021-12-26

## 2021-11-05 NOTE — Progress Notes (Signed)
BP 121/70   Pulse 88   Ht _0  (1.803 m)   Wt 259 lb (117.5 kg)   SpO2 94%   BMI 36.12 kg/m    Subjective:   Patient ID: [name], male    DOB: [birth year], 70 y.o.   MRN: [id number]  HPI: Lelan Cush is a 70 y.o. male presenting on 11/05/2021 for Medical Management of Chronic Issues, Diabetes, and Hypertension   HPI Type 2 diabetes mellitus Patient comes in today for recheck of his diabetes. Patient has been currently taking metformin. Patient is currently on an ACE inhibitor/ARB. Patient has seen an ophthalmologist this year. Patient denies any issues with their feet. The symptom started onset as an adult htn ARE RELATED TO DM.  Patient cannot call anything in his diet is changed significantly to all of a sudden increases that he is drinking more milk.  Hypertension Patient is currently on irbesartan, and their blood pressure today is 121/70. Patient denies any lightheadedness or dizziness. Patient denies headaches, blurred vision, chest pains, shortness of breath, or weakness. Denies any side effects from medication and is content with current medication.   Relevant past medical, surgical, family and social history reviewed and updated as indicated. Interim medical history since our last visit reviewed. Allergies and medications reviewed and updated.  Review of Systems  Constitutional:  Negative for chills and fever.  Eyes:  Negative for visual disturbance.  Respiratory:  Negative for shortness of breath and wheezing.   Cardiovascular:  Negative for chest pain and leg swelling.  Musculoskeletal:  Negative for back pain and gait problem.  Skin:  Negative for rash.  Neurological:  Negative for dizziness, weakness and light-headedness.  All other systems reviewed and are negative.  Per HPI unless specifically indicated above   Allergies as of 11/05/2021       Reactions   Ramipril Cough        Medication List        Accurate as of November 05, 2021   8:57 AM. If you have any questions, ask your nurse or doctor.          aspirin EC 81 MG tablet Take 81 mg by mouth every evening.   CENTRUM SILVER 50+MEN PO Take 1 tablet by mouth daily. Every now and then   gabapentin 100 MG capsule Commonly known as: NEURONTIN Take 2 capsules (200 mg total) by mouth at bedtime.   glucose blood test strip Use to check blood glucose once daily at varying times.  Dispense Verio One Touch Test strips   hydrOXYzine 25 MG tablet Commonly known as: ATARAX/VISTARIL Take 25 mg by mouth as needed. Cuts in half as needed   irbesartan 150 MG tablet Commonly known as: AVAPRO Take 1 tablet (150 mg total) by mouth daily.   metFORMIN 1000 MG tablet Commonly known as: GLUCOPHAGE Take 1 tablet (1,000 mg total) by mouth 2 (two) times daily with a meal.   OneTouch Delica Lancets 59R Misc Use to check blood glucose once daily at varying times   PROBIOTIC PO Take 1 capsule by mouth 3 (three) times a week.   rosuvastatin 5 MG tablet Commonly known as: Crestor Take 1 tablet (5 mg total) by mouth at bedtime.   tamsulosin 0.4 MG Caps capsule Commonly known as: FLOMAX Take 1 capsule (0.4 mg total) by mouth daily after supper.         Objective:   BP 121/70   Pulse 88   Ht _1  (1.803 m)  Wt 259 lb (117.5 kg)   SpO2 94%   BMI 36.12 kg/m   Wt Readings from Last 3 Encounters:  11/05/21 259 lb (117.5 kg)  09/15/21 268 lb (121.6 kg)  08/27/21 268 lb (121.6 kg)    Physical Exam Vitals and nursing note reviewed.  Constitutional:      General: He is not in acute distress.    Appearance: He is well-developed. He is not diaphoretic.  Eyes:     General: No scleral icterus.    Conjunctiva/sclera: Conjunctivae normal.  Neck:     Thyroid: No thyromegaly.  Cardiovascular:     Rate and Rhythm: Normal rate and regular rhythm.     Heart sounds: Normal heart sounds. No murmur heard. Pulmonary:     Effort: Pulmonary effort is normal. No  respiratory distress.     Breath sounds: Normal breath sounds. No wheezing.  Musculoskeletal:        General: No swelling. Normal range of motion.     Cervical back: Neck supple.  Lymphadenopathy:     Cervical: No cervical adenopathy.  Skin:    General: Skin is warm and dry.     Findings: No rash.  Neurological:     Mental Status: He is alert and oriented to person, place, and time.     Coordination: Coordination normal.  Psychiatric:        Behavior: Behavior normal.    Results for orders placed or performed in visit on 10/29/21  CBC with Differential  Result Value Ref Range   WBC 7.2 4.0 - 10.5 K/uL   RBC 5.55 4.22 - 5.81 MIL/uL   Hemoglobin 13.7 13.0 - 17.0 g/dL   HCT 43.9 39.0 - 52.0 %   MCV 79.1 (L) 80.0 - 100.0 fL   MCH 24.7 (L) 26.0 - 34.0 pg   MCHC 31.2 30.0 - 36.0 g/dL   RDW 17.0 (H) 11.5 - 15.5 %   Platelets 274 150 - 400 K/uL   nRBC 0.0 0.0 - 0.2 %   Neutrophils Relative % 67 %   Neutro Abs 4.9 1.7 - 7.7 K/uL   Lymphocytes Relative 20 %   Lymphs Abs 1.5 0.7 - 4.0 K/uL   Monocytes Relative 9 %   Monocytes Absolute 0.6 0.1 - 1.0 K/uL   Eosinophils Relative 3 %   Eosinophils Absolute 0.2 0.0 - 0.5 K/uL   Basophils Relative 1 %   Basophils Absolute 0.1 0.0 - 0.1 K/uL   Immature Granulocytes 0 %   Abs Immature Granulocytes 0.02 0.00 - 0.07 K/uL    Assessment & Plan:   Problem List Items Addressed This Visit       Cardiovascular and Mediastinum   Hypertension associated with diabetes ([place])   Relevant Orders   CBC with Differential/Platelet   CMP14+EGFR   Lipid panel   Bayer DCA Hb A1c Waived     Endocrine   Diabetes mellitus type 2 in obese ([place]) - Primary   Relevant Orders   CBC with Differential/Platelet   CMP14+EGFR   Lipid panel   Bayer DCA Hb A1c Waived   AMB Referral to [facility]    A1c is greater than 14, patient cannot explain as to why it still high, will refer to Lottie Dawson pharmacist for education and help and  counseling.  We will also start on Trulicity, I sent a prescription for 0.75 mg for 2 weeks and then gave a sample of 1.5 mg for 2 weeks and then will go up  to 3 mg after that. Follow up plan: Return in about 3 months (around 02/05/2022), or if symptoms worsen or fail to improve, for diabetes and htn.  Counseling provided for all of the vaccine components Orders Placed This Encounter  Procedures   CBC with Differential/Platelet   CMP14+EGFR   Lipid panel   Bayer DCA Hb A1c Waived   AMB Referral to [place] Remmy Riffe, MD [facility] 11/05/2021, 8:57 AM

## 2021-11-05 NOTE — Chronic Care Management (AMB) (Signed)
  Chronic Care Management   Outreach Note  11/05/2021 Name: [name] MRN: [id number] DOB: [birth year]  [name] is a 70 y.o. year old male who is a primary care patient of Dettinger, Fransisca Kaufmann, MD. I reached out to [name] by phone today in response to a referral sent by [name] primary care provider.  An unsuccessful telephone outreach was attempted today. The patient was referred to the case management team for assistance with care management and care coordination.   Follow Up Plan: A HIPAA compliant phone message was left for the patient providing contact information and requesting a return call.  The care management team will reach out to the patient again over the next 7 days.  If patient returns call to provider office, please advise to call [name] at [place], [address], [place], [place] [address] Direct Dial: [phone] [name]: [email or web address]

## 2021-11-07 LAB — LIPID PANEL
Chol/HDL Ratio: 3.3 ratio (ref 0.0–5.0)
Cholesterol, Total: 102 mg/dL (ref 100–199)
HDL: 31 mg/dL — ABNORMAL LOW (ref >39)
LDL Chol Calc (NIH): 42 mg/dL (ref 0–99)
Triglycerides: 171 mg/dL — ABNORMAL HIGH (ref 0–149)
VLDL Cholesterol Cal: 29 mg/dL (ref 5–40)

## 2021-11-07 LAB — CMP14+EGFR
ALT: 20 IU/L (ref 0–44)
AST: 16 IU/L (ref 0–40)
Albumin/Globulin Ratio: 1.5 (ref 1.2–2.2)
Albumin: 4.3 g/dL (ref 3.8–4.8)
Alkaline Phosphatase: 126 IU/L — ABNORMAL HIGH (ref 44–121)
BUN/Creatinine Ratio: 23 (ref 10–24)
BUN: 32 mg/dL — ABNORMAL HIGH (ref 8–27)
Bilirubin Total: 0.5 mg/dL (ref 0.0–1.2)
CO2: 24 mmol/L (ref 20–29)
Calcium: 9.9 mg/dL (ref 8.6–10.2)
Chloride: 92 mmol/L — ABNORMAL LOW (ref 96–106)
Creatinine, Ser: 1.38 mg/dL — ABNORMAL HIGH (ref 0.76–1.27)
Globulin, Total: 2.9 g/dL (ref 1.5–4.5)
Glucose: 502 mg/dL (ref 70–99)
Potassium: 4.5 mmol/L (ref 3.5–5.2)
Sodium: 132 mmol/L — ABNORMAL LOW (ref 134–144)
Total Protein: 7.2 g/dL (ref 6.0–8.5)
eGFR: 55 mL/min/{1.73_m2} — ABNORMAL LOW (ref >59)

## 2021-11-07 LAB — CBC WITH DIFFERENTIAL/PLATELET
Basophils Absolute: 0.1 10*3/uL (ref 0.0–0.2)
Basos: 1 %
EOS (ABSOLUTE): 0.2 10*3/uL (ref 0.0–0.4)
Eos: 3 %
Hematocrit: 43.3 % (ref 37.5–51.0)
Hemoglobin: 13.5 g/dL (ref 13.0–17.7)
Immature Grans (Abs): 0 10*3/uL (ref 0.0–0.1)
Immature Granulocytes: 0 %
Lymphocytes Absolute: 1.4 10*3/uL (ref 0.7–3.1)
Lymphs: 24 %
MCH: 24.9 pg — ABNORMAL LOW (ref 26.6–33.0)
MCHC: 31.2 g/dL — ABNORMAL LOW (ref 31.5–35.7)
MCV: 80 fL (ref 79–97)
Monocytes Absolute: 0.5 10*3/uL (ref 0.1–0.9)
Monocytes: 10 %
Neutrophils Absolute: 3.4 10*3/uL (ref 1.4–7.0)
Neutrophils: 62 %
Platelets: 265 10*3/uL (ref 150–450)
RBC: 5.42 x10E6/uL (ref 4.14–5.80)
RDW: 16.9 % — ABNORMAL HIGH (ref 11.6–15.4)
WBC: 5.5 10*3/uL (ref 3.4–10.8)

## 2021-11-18 NOTE — Chronic Care Management (AMB) (Signed)
  Chronic Care Management   Outreach Note  11/18/2021 Name: Stefen Juba MRN: [id number] DOB: 1951-03-10  Estefan Pattison is a 70 y.o. year old male who is a primary care patient of Dettinger, Fransisca Kaufmann, MD. I reached out to Ulice Dash by phone today in response to a referral sent by Mr. Kyri Cedeno's primary care provider.  A second unsuccessful telephone outreach was attempted today. The patient was referred to the case management team for assistance with care management and care coordination.   Follow Up Plan: A HIPAA compliant phone message was left for the patient providing contact information and requesting a return call.  The care management team will reach out to the patient again over the next 5 days.  If patient returns call to provider office, please advise to call [facility] at [place], [address], [place], [place] [address] Direct Dial: [phone] [email or web address] Website: [email or web address]

## 2021-11-21 NOTE — Chronic Care Management (AMB) (Signed)
  Chronic Care Management   Note  11/21/2021 Name: [name] MRN: [id number] DOB: Jun 02, 1951  [name] is a 70 y.o. year old male who is a primary care patient of Dettinger, Fransisca Kaufmann, MD. I reached out to [name] by phone today in response to a referral sent by [name]'s PCP.  [name] was given information about Chronic Care Management services today including:  CCM service includes personalized support from designated clinical staff supervised by his physician, including individualized plan of care and coordination with other care providers 24/7 contact phone numbers for assistance for urgent and routine care needs. Service will only be billed when office clinical staff spend 20 minutes or more in a month to coordinate care. Only one practitioner may furnish and bill the service in a calendar month. The patient may stop CCM services at any time (effective at the end of the month) by phone call to the office staff. The patient is responsible for co-pay (up to 20% after annual deductible is met) if co-pay is required by the individual health plan.   Patient agreed to services and verbal consent obtained.   Follow up plan: Telephone appointment with care management team member scheduled for:12/26/2021   Noreene Larsson, [place], [place], [place] [address] Direct Dial: [phone] [email or web address]

## 2021-12-08 ENCOUNTER — Other Ambulatory Visit: Payer: Self-pay | Admitting: Family Medicine

## 2021-12-08 DIAGNOSIS — E1169 Type 2 diabetes mellitus with other specified complication: Secondary | ICD-10-CM

## 2021-12-24 ENCOUNTER — Encounter (HOSPITAL_COMMUNITY): Payer: Medicare Other

## 2021-12-24 ENCOUNTER — Ambulatory Visit (HOSPITAL_COMMUNITY): Payer: Medicare Other | Admitting: Physician Assistant

## 2021-12-24 ENCOUNTER — Other Ambulatory Visit (HOSPITAL_COMMUNITY): Payer: Medicare Other

## 2021-12-24 NOTE — Progress Notes (Signed)
[place], [place] [address]   CLINIC:  Medical Oncology/Hematology  PCP:  Dettinger, Fransisca Kaufmann, MD [place] [address] [phone]   REASON FOR VISIT:  Follow-up for secondary polycythemia   PRIOR THERAPY: None   CURRENT THERAPY: Intermittent phlebotomies, last on 10/29/2021   INTERVAL HISTORY:  [name] 71 y.o. male returns for routine follow-up of secondary polycythemia.  He was last seen by Tarri Abernethy PA-C on 07/09/2021.  He has had 3 phlebotomies since his last visit, most recently on 10/29/2021.  At today's visit, he reports feeling fairly well.  No recent hospitalizations, surgeries, or changes in baseline health status.  He reports that he continues to feel hot at night and does not sleep very well.  Usually, this symptom will improve after phlebotomy, with improvements lasting only about 1 week.  However, he denies any of his other "usual" symptoms that he notices when his, adequate is elevated.  He usually feels some excessive thirst, which she currently denies.  He reports that he feels okay without a phlebotomy today. No erythromelalgia, aquagenic pruritus, Raynaud's, or vasomotor symptoms.  No fever, chills, or unintentional weight loss.  He remains compliant with CPAP. He has been tolerating his phlebotomies well. He has not yet started his oral ferrous sulfate, as previously instructed.  He has 70% energy and 90% appetite. He endorses that he has been working on some intentional weight loss via diet and exercise.   REVIEW OF SYSTEMS:  Review of Systems  Constitutional:  Positive for fatigue (mild, stable, energy 70%). Negative for appetite change, chills, diaphoresis, fever and unexpected weight change.  HENT:   Negative for lump/mass and nosebleeds.   Eyes:  Negative for eye problems.  Respiratory:  Negative for cough, hemoptysis and shortness of breath.   Cardiovascular:  Negative for chest pain,  leg swelling and palpitations.  Gastrointestinal:  Negative for abdominal pain, blood in stool, constipation, diarrhea, nausea and vomiting.  Endocrine: Positive for hot flashes (feels hot at night).  Genitourinary:  Negative for hematuria.   Skin: Negative.   Neurological:  Negative for dizziness, headaches and light-headedness.  Hematological:  Does not bruise/bleed easily.     PAST MEDICAL/SURGICAL HISTORY:  Past Medical History:  Diagnosis Date   Diabetes mellitus without complication ([place])    GERD (gastroesophageal reflux disease)    Hx of [place] spotted fever [id number]   Hypertension    Sleep apnea    wears cpap    Past Surgical History:  Procedure Laterality Date   COLONOSCOPY  09/2012   Dr. Britta Mccreedy. Diverticulosis, descending adenomatous colon polyp, hyperplastic rectal polyp. ?mass in anal canal evaluated by surgery and consistent with internal hemorrhoid   COLONOSCOPY WITH PROPOFOL N/A 02/07/2019   Procedure: COLONOSCOPY WITH PROPOFOL;  Surgeon: Danie Binder, MD;  Location: [facility];  Service: Endoscopy;  Laterality: N/A;  9:30am   KNEE SURGERY Left 10/2014   KNEE SURGERY Right 09/2015   POLYPECTOMY  02/07/2019   Procedure: POLYPECTOMY;  Surgeon: Danie Binder, MD;  Location: [facility];  Service: Endoscopy;;     SOCIAL HISTORY:  Social History   Socioeconomic History   Marital status: Single    Spouse name: Not on file   Number of children: 0   Years of education: 12   Highest education level: 12th grade  Occupational History   Occupation: Retired    Comment: Pensions consultant and gamble  Tobacco Use  Smoking status: Never   Smokeless tobacco: Never  Vaping Use   Vaping Use: Never used  Substance and Sexual Activity   Alcohol use: No   Drug use: No   Sexual activity: Not Currently  Other Topics Concern   Not on file  Social History Narrative   WORKED AT [employer] > 10 YRS. NOT MARRIED. FUR BABIES: 3 DOGS AND 5 CATS. SPENDS  FREE TIME: [place], [place], [place].   Social Determinants of Health   Financial Resource Strain: Low Risk    Difficulty of Paying Living Expenses: Not hard at all  Food Insecurity: No Food Insecurity   Worried About Charity fundraiser in the Last Year: Never true   [place] in the Last Year: Never true  Transportation Needs: No Transportation Needs   Lack of Transportation (Medical): No   Lack of Transportation (Non-Medical): No  Physical Activity: Sufficiently Active   Days of Exercise per Week: 7 days   Minutes of Exercise per Session: 30 min  Stress: No Stress Concern Present   Feeling of Stress : Not at all  Social Connections: Moderately Integrated   Frequency of Communication with Friends and Family: More than three times a week   Frequency of Social Gatherings with Friends and Family: More than three times a week   Attends Religious Services: More than 4 times per year   Active Member of [employer] or Organizations: Yes   Attends Music therapist: More than 4 times per year   Marital Status: Never married  Human resources officer Violence: Not At Risk   Fear of Current or Ex-Partner: No   Emotionally Abused: No   Physically Abused: No   Sexually Abused: No    FAMILY HISTORY:  Family History  Problem Relation Age of Onset   Heart disease Mother    Congestive Heart Failure Mother    Diabetes Father    Aneurysm Sister    Diabetes Paternal Uncle    Diabetes Paternal Grandmother    Colon cancer Neg Hx    Colon polyps Neg Hx     CURRENT MEDICATIONS:  Outpatient Encounter Medications as of 12/25/2021  Medication Sig   aspirin EC 81 MG tablet Take 81 mg by mouth every evening.   Dulaglutide (TRULICITY) 6.62 HU/7.6LY SOPN Inject 0.75 mg into the skin once a week.   Dulaglutide (TRULICITY) 3 YT/0.3TW SOPN Inject 3 mg as directed once a week.   gabapentin (NEURONTIN) 100 MG capsule TAKE TWO CAPSULES BY MOUTH AT BEDTIME   glucose blood test strip Use to check  blood glucose once daily at varying times.  Dispense Verio One Touch Test strips   hydrOXYzine (ATARAX/VISTARIL) 25 MG tablet Take 25 mg by mouth as needed. Cuts in half as needed   irbesartan (AVAPRO) 150 MG tablet Take 1 tablet (150 mg total) by mouth daily.   metFORMIN (GLUCOPHAGE) 1000 MG tablet TAKE 1 TABLET BY MOUTH TWICE DAILY WITH A MEAL   Multiple Vitamins-Minerals (CENTRUM SILVER 50+MEN PO) Take 1 tablet by mouth daily. Every now and then   [facility] LANCETS 65K MISC Use to check blood glucose once daily at varying times   Probiotic Product (PROBIOTIC PO) Take 1 capsule by mouth 3 (three) times a week.    rosuvastatin (CRESTOR) 5 MG tablet Take 1 tablet (5 mg total) by mouth at bedtime.   tamsulosin (FLOMAX) 0.4 MG CAPS capsule Take 1 capsule (0.4 mg total) by mouth daily after supper.  No facility-administered encounter medications on file as of 12/25/2021.    ALLERGIES:  Allergies  Allergen Reactions   Ramipril Cough     PHYSICAL EXAM:  ECOG PERFORMANCE STATUS: 1 - Symptomatic but completely ambulatory  There were no vitals filed for this visit. There were no vitals filed for this visit. Physical Exam Constitutional:      Appearance: Normal appearance. He is obese.  HENT:     Head: Normocephalic and atraumatic.     Mouth/Throat:     Mouth: Mucous membranes are moist.  Eyes:     Extraocular Movements: Extraocular movements intact.     Pupils: Pupils are equal, round, and reactive to light.  Cardiovascular:     Rate and Rhythm: Normal rate and regular rhythm.     Pulses: Normal pulses.     Heart sounds: Normal heart sounds.  Pulmonary:     Effort: Pulmonary effort is normal.     Breath sounds: Normal breath sounds.  Abdominal:     General: Bowel sounds are normal.     Palpations: Abdomen is soft.     Tenderness: There is no abdominal tenderness.  Musculoskeletal:        General: No swelling.     Right lower leg: No edema.     Left lower leg: No edema.   Lymphadenopathy:     Cervical: No cervical adenopathy.  Skin:    General: Skin is warm and dry.  Neurological:     General: No focal deficit present.     Mental Status: He is alert and oriented to person, place, and time.  Psychiatric:        Mood and Affect: Mood normal.        Behavior: Behavior normal.     LABORATORY DATA:  I have reviewed the labs as listed.  CBC    Component Value Date/Time   WBC 5.5 11/05/2021 0801   WBC 7.2 10/29/2021 1303   RBC 5.42 11/05/2021 0801   RBC 5.55 10/29/2021 1303   HGB 13.5 11/05/2021 0801   HCT 43.3 11/05/2021 0801   PLT 265 11/05/2021 0801   MCV 80 11/05/2021 0801   MCH 24.9 (L) 11/05/2021 0801   MCH 24.7 (L) 10/29/2021 1303   MCHC 31.2 (L) 11/05/2021 0801   MCHC 31.2 10/29/2021 1303   RDW 16.9 (H) 11/05/2021 0801   LYMPHSABS 1.4 11/05/2021 0801   MONOABS 0.6 10/29/2021 1303   EOSABS 0.2 11/05/2021 0801   BASOSABS 0.1 11/05/2021 0801   CMP Latest Ref Rng & Units 11/05/2021 07/24/2021 03/06/2021  Glucose 70 - 99 mg/dL 502(HH) 136(H) 237(H)  BUN 8 - 27 mg/dL 32(H) 27 24(H)  Creatinine 0.76 - 1.27 mg/dL 1.38(H) 1.22 1.19  Sodium 134 - 144 mmol/L 132(L) 138 135  Potassium 3.5 - 5.2 mmol/L 4.5 5.0 4.5  Chloride 96 - 106 mmol/L 92(L) 102 100  CO2 20 - 29 mmol/L 24 23 25   Calcium 8.6 - 10.2 mg/dL 9.9 9.6 9.1  Total Protein 6.0 - 8.5 g/dL 7.2 7.4 7.3  Total Bilirubin 0.0 - 1.2 mg/dL 0.5 0.7 0.9  Alkaline Phos 44 - 121 IU/L 126(H) 74 63  AST 0 - 40 IU/L 16 25 23   ALT 0 - 44 IU/L 20 25 21     DIAGNOSTIC IMAGING:  I have independently reviewed the relevant imaging and discussed with the patient.  ASSESSMENT & PLAN: 1.  Secondary polycythemia (JAK2 negative): - Patient tested NEGATIVE for JAK2 mutations, CALR, and MPL. - Erythropoietin level was  normal at 10 - Other work-up was normal (BCR/ABL, HIV, SPEP, ANA, CRP, RF) - He has sleep apnea and uses CPAP machine.  Lifelong non-smoker. - He started phlebotomies in February 2020,  averaging phlebotomy every 6 to 8 weeks. - Reports excessive heat, night sweats, and thirst when his Hgb/HCT is elevated.  He reports that right now his symptoms are "not as bad as usual," and that he is okay without phlebotomy today. - Denies aquagenic pruritus or other vasomotor symptoms.   - Patient reports that he feels better when HCT < 40 - Differential diagnosis favors secondary polycythemia in the setting of OSA, but it is also possible that he may have triple negative primary polycythemia - Labs today (12/25/2021): Hgb 13.3/HCT 44.0 - PLAN: We will hold off on phlebotomy today, since his symptoms are minimal and his hematocrit is within physiological normal range.  Continue CBC and phlebotomy every 8 weeks.  Office visit in 24 weeks.   2.  Sleep apnea - Patient reports compliance with CPAP nightly - PLAN: Continue CPAP   3.  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 - He was instructed at last visit to start taking ferrous sulfate 325 mg daily, but failed to do so - Most recent iron panel (12/25/2021): Ferritin 7, iron saturation 7% with TIBC 464 - PLAN: Continue with phlebotomy for polycythemia as above, will periodically check iron. - Mnagement of iron deficiency in the setting of secondary polycythemia is poorly defined, but it is reasonable to consider combination of phlebotomy in addition to oral iron therapy in order to improve iron stores while safely maintaining an asymptomatic hematocrit level.  Recommended to patient that he start taking ferrous sulfate 325 mg OTC daily. - We will recheck iron panel at his follow-up visit in 24 weeks.   4.   Health Maintenance & Other: - Patient had a colonoscopy on 02/07/2019 that showed a 5 mm polyp in the ascending colon that showed tubular adenoma.  Diverticulosis in the rectosigmoid colon and the ascending colon.  Torturous colon.  External and  internal hemorrhoids. - Elevated PSA:  Patient has a PSA of 9.2, followed by [facility].  Prostate biopsy on 03/30/2017 with 12 cores submitted that were benign. - Positive hep B serology:  Patient has positive hep B surface antibody and positive hep B core antibody. Surface antigen and IgM are negative.  Hep C and HIV are negative. Patient is immune based on prior exposure.   PLAN SUMMARY & DISPOSITION: CBC with as needed phlebotomy every 8 weeks CBC and iron panel with RTC in 24 weeks  All questions were answered. The patient knows to call the clinic with any problems, questions or concerns.  Medical decision making: Low  Time spent on visit: I spent 15 minutes counseling the patient face to face. The total time spent in the appointment was 25 minutes and more than 50% was on counseling.   Harriett Rush, PA-C  12/25/2021 2:34 PM

## 2021-12-25 ENCOUNTER — Inpatient Hospital Stay (HOSPITAL_COMMUNITY): Payer: Medicare Other | Admitting: Physician Assistant

## 2021-12-25 ENCOUNTER — Other Ambulatory Visit: Payer: Self-pay

## 2021-12-25 ENCOUNTER — Inpatient Hospital Stay (HOSPITAL_COMMUNITY): Payer: Medicare Other

## 2021-12-25 ENCOUNTER — Inpatient Hospital Stay (HOSPITAL_COMMUNITY): Payer: Medicare Other | Attending: Physician Assistant

## 2021-12-25 VITALS — BP 128/85 | HR 66 | Temp 97.9°F | Resp 17 | Ht 71.0 in | Wt 259.6 lb

## 2021-12-25 DIAGNOSIS — D751 Secondary polycythemia: Secondary | ICD-10-CM | POA: Insufficient documentation

## 2021-12-25 DIAGNOSIS — I10 Essential (primary) hypertension: Secondary | ICD-10-CM | POA: Insufficient documentation

## 2021-12-25 DIAGNOSIS — Z148 Genetic carrier of other disease: Secondary | ICD-10-CM

## 2021-12-25 DIAGNOSIS — E119 Type 2 diabetes mellitus without complications: Secondary | ICD-10-CM | POA: Insufficient documentation

## 2021-12-25 DIAGNOSIS — G4730 Sleep apnea, unspecified: Secondary | ICD-10-CM | POA: Diagnosis not present

## 2021-12-25 LAB — CBC WITH DIFFERENTIAL/PLATELET
Abs Immature Granulocytes: 0.03 10*3/uL (ref 0.00–0.07)
Basophils Absolute: 0.1 10*3/uL (ref 0.0–0.1)
Basophils Relative: 1 %
Eosinophils Absolute: 0.2 10*3/uL (ref 0.0–0.5)
Eosinophils Relative: 3 %
HCT: 44 % (ref 39.0–52.0)
Hemoglobin: 13.3 g/dL (ref 13.0–17.0)
Immature Granulocytes: 1 %
Lymphocytes Relative: 23 %
Lymphs Abs: 1.4 10*3/uL (ref 0.7–4.0)
MCH: 24.6 pg — ABNORMAL LOW (ref 26.0–34.0)
MCHC: 30.2 g/dL (ref 30.0–36.0)
MCV: 81.3 fL (ref 80.0–100.0)
Monocytes Absolute: 0.5 10*3/uL (ref 0.1–1.0)
Monocytes Relative: 8 %
Neutro Abs: 3.9 10*3/uL (ref 1.7–7.7)
Neutrophils Relative %: 64 %
Platelets: 293 10*3/uL (ref 150–400)
RBC: 5.41 MIL/uL (ref 4.22–5.81)
RDW: 15.8 % — ABNORMAL HIGH (ref 11.5–15.5)
WBC: 6 10*3/uL (ref 4.0–10.5)
nRBC: 0 % (ref 0.0–0.2)

## 2021-12-25 LAB — IRON AND TIBC
Iron: 31 ug/dL — ABNORMAL LOW (ref 45–182)
Saturation Ratios: 7 % — ABNORMAL LOW (ref 17.9–39.5)
TIBC: 464 ug/dL — ABNORMAL HIGH (ref 250–450)
UIBC: 433 ug/dL

## 2021-12-25 LAB — FERRITIN: Ferritin: 7 ng/mL — ABNORMAL LOW (ref 24–336)

## 2021-12-25 NOTE — Patient Instructions (Signed)
[place] at [facility] Discharge Instructions  You were seen today by Tarri Abernethy PA-C for your elevated blood counts.  Your blood levels are normal today, and you are not having any severe symptoms.  We will hold off on any phlebotomy today, and will schedule you for labs and possible phlebotomy every 8 weeks.  If you have any severe symptoms before that appointment, please call our office for further instructions.  You should START taking iron pills daily due to your low iron.  We will check your iron levels again at your next office visit.  LABS: CBC and possible phlebotomy every 8 weeks   MEDICATIONS: Daily iron tablet (ferrous sulfate)  FOLLOW-UP APPOINTMENT: Office visit in about 6 months   Thank you for choosing [place] at [facility] to provide your oncology and hematology care.  To afford each patient quality time with our provider, please arrive at least 15 minutes before your scheduled appointment time.   If you have a lab appointment with the [facility] please come in thru the Main Entrance and check in at the main information desk.  You need to re-schedule your appointment should you arrive 10 or more minutes late.  We strive to give you quality time with our providers, and arriving late affects you and other patients whose appointments are after yours.  Also, if you no show three or more times for appointments you may be dismissed from the clinic at the providers discretion.     Again, thank you for choosing [facility].  Our hope is that these requests will decrease the amount of time that you wait before being seen by our physicians.       _____________________________________________________________  Should you have questions after your visit to [facility], please contact our office at [phone] and follow the prompts.  Our office hours are 8:00 a.m. and 4:30 p.m. Monday - Friday.   Please note that voicemails left after 4:00 p.m. may not be returned until the following business day.  We are closed weekends and major holidays.  You do have access to a nurse 24-7, just call the main number to the clinic [phone] and do not press any options, hold on the line and a nurse will answer the phone.    For prescription refill requests, have your pharmacy contact our office and allow 72 hours.    Due to Covid, you will need to wear a mask upon entering the hospital. If you do not have a mask, a mask will be given to you at the Main Entrance upon arrival. For doctor visits, patients may have 1 support person age 41 or older with them. For treatment visits, patients can not have anyone with them due to social distancing guidelines and our immunocompromised population.

## 2021-12-25 NOTE — Progress Notes (Signed)
No phlebotomy today per Tarri Abernethy PA.

## 2021-12-26 ENCOUNTER — Ambulatory Visit (INDEPENDENT_AMBULATORY_CARE_PROVIDER_SITE_OTHER): Payer: Medicare Other | Admitting: Pharmacist

## 2021-12-26 DIAGNOSIS — E1169 Type 2 diabetes mellitus with other specified complication: Secondary | ICD-10-CM

## 2021-12-26 DIAGNOSIS — E7800 Pure hypercholesterolemia, unspecified: Secondary | ICD-10-CM

## 2021-12-26 MED ORDER — TRULICITY 1.5 MG/0.5ML ~~LOC~~ SOAJ: 1.5000 mg | SUBCUTANEOUS | 3 refills | Status: DC

## 2021-12-26 NOTE — Progress Notes (Signed)
Chronic Care Management Pharmacy Note  12/26/2021 Name:  Henrick Mcgue MRN:  [id number] DOB:  September 08, 1951  Summary: T2DM, HLD  Recommendations/Changes made from today's visit:  Diabetes: New goal. Uncontrolled--A1C >14% current treatment: TRULICITY 0.27OZ SQ WEEKLY, METFORMIN 1G TWICE DAILY;  INCREASE TRULICITY TO 3.6UY  WILL CONTINUE TO TITRATE AS TOLERATED Denies personal and family history of Medullary thyroid cancer (MTC) APPLICATION SUBMITTED TO [facility] PATIENT ASSISTANCE PROGRAM FOR TRULICITY (PATIENT CAN'T AFFORD) MAY HAVE TO ADD INSULIN Current glucose readings: fasting glucose: >200, post prandial glucose: NOT CHECKING Discussed meal planning options and Plate method for healthy eating Avoid sugary drinks and desserts Incorporate balanced protein, non starchy veggies, 1 serving of carbohydrate with each meal Increase water intake Increase physical activity as able Current exercise: N/A; ENCOURAGED Recommended INCREASE TRULICITY, LIKELY ADD SGLT2 AT F/U AS WELL Assessed patient finances. [facility] PATIENT ASSISTANCE  Hyperlipidemia:  Goal on Track (progressing): YES. controlled; current treatment:ROSUVASTATIN;  Medications previously tried: N/A  Current dietary patterns: ENCOURAGED HEART HEALTHY, MEDITERRANEAN  Counseled on DIETARY CHANGES; CONTINUE CURRENT THERAPY Lipid Panel     Component Value Date/Time   CHOL 102 11/05/2021 0801   TRIG 171 (H) 11/05/2021 0801   HDL 31 (L) 11/05/2021 0801   CHOLHDL 3.3 11/05/2021 0801   CHOLHDL 4.1 09/20/2020 0921   VLDL 15 09/20/2020 0921   LDLCALC 42 11/05/2021 0801   LABVLDL 29 11/05/2021 0801    Patient Goals/Self-Care Activities patient will:  - take medications as prescribed as evidenced by patient report and record review check glucose DAILY, document, and provide at future appointments collaborate with provider on medication access solutions target a minimum of 150 minutes of moderate intensity exercise  weekly engage in dietary modifications by HEART HEALTHY, HEALTHY PLATE METHOD FOR DIABETES  Plan: 2 MONTHS  Subjective: Minard Millirons is an 71 y.o. year old male who is a primary patient of Dettinger, Fransisca Kaufmann, MD.  The CCM team was consulted for assistance with disease management and care coordination needs.    Engaged with patient by telephone for follow up visit in response to provider referral for pharmacy case management and/or care coordination services.   Consent to Services:  The patient was given information about Chronic Care Management services, agreed to services, and gave verbal consent prior to initiation of services.  Please see initial visit note for detailed documentation.   Patient Care Team: Dettinger, Fransisca Kaufmann, MD as PCP - General (Family Medicine) Glennie Isle, NP-C (Inactive) as Nurse Practitioner (Hematology and Oncology) Franchot Gallo, MD as Consulting Physician (Urology) Lavera Guise, [facility] as Pharmacist (Family Medicine)  Objective:  Lab Results  Component Value Date   CREATININE 1.38 (H) 11/05/2021   CREATININE 1.22 07/24/2021   CREATININE 1.19 03/06/2021    Lab Results  Component Value Date   HGBA1C >14.0 (H) 11/05/2021   Last diabetic Eye exam:  Lab Results  Component Value Date/Time   HMDIABEYEEXA No Retinopathy 09/17/2020 12:00 AM    Last diabetic Foot exam: No results found for: HMDIABFOOTEX      Component Value Date/Time   CHOL 102 11/05/2021 0801   TRIG 171 (H) 11/05/2021 0801   HDL 31 (L) 11/05/2021 0801   CHOLHDL 3.3 11/05/2021 0801   CHOLHDL 4.1 09/20/2020 0921   VLDL 15 09/20/2020 0921   LDLCALC 42 11/05/2021 0801    Hepatic Function Latest Ref Rng & Units 11/05/2021 07/24/2021 03/06/2021  Total Protein 6.0 - 8.5 g/dL 7.2 7.4 7.3  Albumin 3.8 -  4.8 g/dL 4.3 4.6 3.9  AST 0 - 40 IU/L _0 ALT 0 - 44 IU/L _1 Alk Phosphatase 44 - 121 IU/L 126(H) 74 63  Total Bilirubin 0.0 - 1.2 mg/dL 0.5 0.7 0.9    Lab  Results  Component Value Date/Time   TSH 2.389 10/19/2019 12:40 PM   TSH 4.010 09/08/2019 10:54 AM   TSH 4.370 05/03/2018 11:21 AM    CBC Latest Ref Rng & Units 12/25/2021 11/05/2021 10/29/2021  WBC 4.0 - 10.5 K/uL 6.0 5.5 7.2  Hemoglobin 13.0 - 17.0 g/dL 13.3 13.5 13.7  Hematocrit 39.0 - 52.0 % 44.0 43.3 43.9  Platelets 150 - 400 K/uL 293 265 274    Lab Results  Component Value Date/Time   VD25OH 36.36 09/20/2020 09:21 AM   VD25OH 31.92 08/02/2020 09:30 AM    Clinical ASCVD: No  The ASCVD Risk score (Arnett DK, et al., 2019) failed to calculate for the following reasons:   The valid total cholesterol range is 130 to 320 mg/dL    Other: (CHADS2VASc if Afib, PHQ9 if depression, MMRC or CAT for COPD, ACT, DEXA)  Social History   Tobacco Use  Smoking Status Never  Smokeless Tobacco Never   BP Readings from Last 3 Encounters:  12/25/21 128/85  11/05/21 121/70  10/29/21 123/61   Pulse Readings from Last 3 Encounters:  12/25/21 66  11/05/21 88  10/29/21 68   Wt Readings from Last 3 Encounters:  12/25/21 259 lb 9.6 oz (117.8 kg)  11/05/21 259 lb (117.5 kg)  09/15/21 268 lb (121.6 kg)    Assessment: Review of patient past medical history, allergies, medications, health status, including review of consultants reports, laboratory and other test data, was performed as part of comprehensive evaluation and provision of chronic care management services.   SDOH:  (Social Determinants of Health) assessments and interventions performed:    CCM Care Plan  Allergies  Allergen Reactions   Ramipril Cough    Medications Reviewed Today     Reviewed by Lavera Guise, [facility] (Pharmacist) on 12/26/21 at 1029  Med List Status: <None>   Medication Order Taking? Sig Documenting Provider Last Dose Status Informant  aspirin EC 81 MG tablet [id number] No Take 81 mg by mouth every evening. [provider] Taking Active Self  Dulaglutide (TRULICITY) 1.5 YQ/6.5HQ SOPN [id number]  Yes Inject 1.5 mg into the skin once a week. Dettinger, Fransisca Kaufmann, MD  Active   gabapentin (NEURONTIN) 100 MG capsule [id number] No TAKE TWO CAPSULES BY MOUTH AT BEDTIME Dettinger, Fransisca Kaufmann, MD Taking Active   glucose blood test strip [id number] No Use to check blood glucose once daily at varying times.  Dispense Verio One Touch Test strips Eckard, Tammy, RPH-CPP Taking Active Self  hydrochlorothiazide (HYDRODIURIL) 25 MG tablet [id number] No Take 25 mg by mouth daily. [provider] Taking Active   hydrOXYzine (ATARAX/VISTARIL) 25 MG tablet [id number] No Take 25 mg by mouth as needed. Cuts in half as needed  Patient not taking: Reported on 12/25/2021   [provider] Not Taking Active   irbesartan (AVAPRO) 150 MG tablet [id number] No Take 1 tablet (150 mg total) by mouth daily. Dettinger, Fransisca Kaufmann, MD Taking Active   metFORMIN (GLUCOPHAGE) 1000 MG tablet [id number] No TAKE 1 TABLET BY MOUTH TWICE DAILY WITH A MEAL Dettinger, Fransisca Kaufmann, MD Taking Active   Multiple Vitamins-Minerals (CENTRUM SILVER 50+MEN PO) [id number] No Take 1 tablet by mouth daily. Every now and then [provider] Taking Active Self  Jonetta Speak LANCETS 48G MISC [id number] No Use to check blood glucose once daily at varying times Eckard, Tammy, RPH-CPP Taking Active Self  Probiotic Product (PROBIOTIC PO) [id number] No Take 1 capsule by mouth 3 (three) times a week.  [provider] Taking Active Self  rosuvastatin (CRESTOR) 5 MG tablet [id number] No Take 1 tablet (5 mg total) by mouth at bedtime. Dettinger, Fransisca Kaufmann, MD Taking Active   tamsulosin [facility]) 0.4 MG CAPS capsule [id number] No Take 1 capsule (0.4 mg total) by mouth daily after supper. Dettinger, Fransisca Kaufmann, MD Taking Active             Patient Active Problem List   Diagnosis Date Noted   Polycythemia, secondary 04/12/2019   History of colonic polyps 01/23/2019   History of rectal bleeding 08/25/2018   OSA on CPAP 06/21/2017    Diabetes mellitus type 2 in obese ([place]) 10/22/2016   Elevated PSA 10/22/2016   Hypertension associated with diabetes ([place]) 02/03/2011    Immunization History  Administered Date(s) Administered   Fluad Quad(high Dose 65+) 10/10/2019, 08/20/2020, 10/14/2021   Influenza,inj,Quad PF,6+ Mos 09/25/2014, 10/22/2016, 09/22/2017, 09/23/2018   Influenza-Unspecified 08/20/2020   Moderna Sars-Covid-2 Vaccination 01/25/2020, 02/22/2020   Pneumococcal Conjugate-13 09/27/2020   Pneumococcal Polysaccharide-23 10/14/2021   Tdap 09/25/2014   Zoster Recombinat (Shingrix) 04/20/2018    Conditions to be addressed/monitored: HLD and DMII  Care Plan : PHARMD MEDICATION MANAGEMENT  Updates made by Lavera Guise, [place] since 12/29/2021 12:00 AM     Problem: DISEASE PROGRESSION PREVENTION      Long-Range Goal: T2DM PHARMD GOAL   This Visit's Progress: Not on track  Priority: High  Note:   Current Barriers:  Unable to independently afford treatment regimen Unable to achieve control of T2DM  Suboptimal therapeutic regimen for T2DM   Pharmacist Clinical Goal(s):  patient will verbalize ability to afford treatment regimen achieve control of T2DM as evidenced by GOAL A1C<7%, IMPROVED GLYCEMIC CONTROL  through collaboration with PharmD and provider.    Interventions: 1:1 collaboration with Dettinger, Fransisca Kaufmann, MD regarding development and update of comprehensive plan of care as evidenced by provider attestation and co-signature Inter-disciplinary care team collaboration (see longitudinal plan of care) Comprehensive medication review performed; medication list updated in electronic medical record  Diabetes: New goal. Uncontrolled--A1C >14% current treatment: TRULICITY 5.37SM SQ WEEKLY, METFORMIN 1G TWICE DAILY;  INCREASE TRULICITY TO 2.7MB  WILL CONTINUE TO TITRATE AS TOLERATED Denies personal and family history of Medullary thyroid cancer (MTC) APPLICATION SUBMITTED TO [facility] PATIENT  ASSISTANCE PROGRAM FOR TRULICITY (PATIENT CAN'T AFFORD) MAY HAVE TO ADD INSULIN Current glucose readings: fasting glucose: >200, post prandial glucose: NOT CHECKING Discussed meal planning options and Plate method for healthy eating Avoid sugary drinks and desserts Incorporate balanced protein, non starchy veggies, 1 serving of carbohydrate with each meal Increase water intake Increase physical activity as able Current exercise: N/A; ENCOURAGED Recommended INCREASE TRULICITY, LIKELY ADD SGLT2 AT F/U AS WELL Assessed patient finances. [facility] PATIENT ASSISTANCE  Hyperlipidemia:  Goal on Track (progressing): YES. controlled; current treatment:ROSUVASTATIN;  Medications previously tried: N/A  Current dietary patterns: ENCOURAGED HEART HEALTHY, MEDITERRANEAN  Counseled on DIETARY CHANGES; CONTINUE CURRENT THERAPY Lipid Panel     Component Value Date/Time   CHOL 102 11/05/2021 0801   TRIG 171 (H) 11/05/2021 0801   HDL 31 (L) 11/05/2021 0801   CHOLHDL 3.3 11/05/2021 0801   CHOLHDL 4.1 09/20/2020 0921   VLDL 15 09/20/2020 8675  [place] 42 11/05/2021 0801   LABVLDL 29 11/05/2021 0801   Patient Goals/Self-Care Activities patient will:  - take medications as prescribed as evidenced by patient report and record review check glucose DAILY, document, and provide at future appointments collaborate with provider on medication access solutions target a minimum of 150 minutes of moderate intensity exercise weekly engage in dietary modifications by HEART HEALTHY, HEALTHY PLATE METHOD FOR DIABETES      Medication Assistance: Application for [facility]/TRULICITY  medication assistance program. in process.  Anticipated assistance start date TBD.  See plan of care for additional detail.  Patient's preferred pharmacy is:  [facility], [place] [address] [place] [address] Phone: [phone] Fax: [phone]  Follow Up:  Patient agrees to Care  Plan and Follow-up.  Plan: Telephone follow up appointment with care management team member scheduled for:  2 MONTHS  Regina Eck, PharmD, BCPS Clinical Pharmacist, [place]  II Phone [phone]

## 2021-12-29 NOTE — Patient Instructions (Addendum)
Visit Information  Thank you for taking time to visit with me today. Please don't hesitate to contact me if I can be of assistance to you before our next scheduled telephone appointment.  Following are the goals we discussed today:  Current Barriers:  Unable to independently afford treatment regimen Unable to achieve control of T2DM  Suboptimal therapeutic regimen for T2DM   Pharmacist Clinical Goal(s):  patient will verbalize ability to afford treatment regimen achieve control of T2DM as evidenced by GOAL A1C<7%, IMPROVED GLYCEMIC CONTROL through collaboration with PharmD and provider.    Interventions: 1:1 collaboration with Dettinger, Fransisca Kaufmann, MD regarding development and update of comprehensive plan of care as evidenced by provider attestation and co-signature Inter-disciplinary care team collaboration (see longitudinal plan of care) Comprehensive medication review performed; medication list updated in electronic medical record  Diabetes: New goal. Uncontrolled--A1C >14% current treatment: TRULICITY 0.75MG  SQ WEEKLY, METFORMIN 1G TWICE DAILY;  INCREASE TRULICITY TO 1.5MG   WILL CONTINUE TO TITRATE AS TOLERATED Denies personal and family history of Medullary thyroid cancer (MTC) APPLICATION SUBMITTED TO [facility] PATIENT ASSISTANCE PROGRAM FOR TRULICITY (PATIENT CAN'T AFFORD) MAY HAVE TO ADD INSULIN Current glucose readings: fasting glucose: >200, post prandial glucose: NOT CHECKING Discussed meal planning options and Plate method for healthy eating Avoid sugary drinks and desserts Incorporate balanced protein, non starchy veggies, 1 serving of carbohydrate with each meal Increase water intake Increase physical activity as able Current exercise: N/A; ENCOURAGED Recommended INCREASE TRULICITY, LIKELY ADD SGLT2 AT F/U AS WELL Assessed patient finances. [facility] PATIENT ASSISTANCE  Hyperlipidemia:  Goal on Track (progressing): YES. controlled; current  treatment:ROSUVASTATIN;  Medications previously tried: N/A  Current dietary patterns: ENCOURAGED [place] on DIETARY CHANGES; CONTINUE CURRENT THERAPY Lipid Panel     Component Value Date/Time   CHOL 102 11/05/2021 0801   TRIG 171 (H) 11/05/2021 0801   HDL 31 (L) 11/05/2021 0801   CHOLHDL 3.3 11/05/2021 0801   CHOLHDL 4.1 09/20/2020 0921   VLDL 15 09/20/2020 0921   LDLCALC 42 11/05/2021 0801   LABVLDL 29 11/05/2021 0801    Patient Goals/Self-Care Activities patient will:  - take medications as prescribed as evidenced by patient report and record review check glucose DAILY, document, and provide at future appointments collaborate with provider on medication access solutions target a minimum of 150 minutes of moderate intensity exercise weekly engage in dietary modifications by HEART HEALTHY, [place]    Please call the care guide team at [phone] if you need to cancel or reschedule your appointment.   If you are experiencing a Mental Health or [place] or need someone to talk to, please call the Suicide and Crisis Lifeline: 988   The patient verbalized understanding of instructions, educational materials, and care plan provided today and agreed to receive a mailed copy of patient instructions, educational materials, and care plan.   Signature Regina Eck, PharmD, BCPS Clinical Pharmacist, [place]  II Phone [phone]

## 2022-01-13 DIAGNOSIS — E669 Obesity, unspecified: Secondary | ICD-10-CM

## 2022-01-13 DIAGNOSIS — E7800 Pure hypercholesterolemia, unspecified: Secondary | ICD-10-CM | POA: Diagnosis not present

## 2022-01-13 DIAGNOSIS — E1169 Type 2 diabetes mellitus with other specified complication: Secondary | ICD-10-CM | POA: Diagnosis not present

## 2022-02-01 ENCOUNTER — Encounter: Payer: Self-pay | Admitting: Family Medicine

## 2022-02-01 DIAGNOSIS — I152 Hypertension secondary to endocrine disorders: Secondary | ICD-10-CM

## 2022-02-01 DIAGNOSIS — E1159 Type 2 diabetes mellitus with other circulatory complications: Secondary | ICD-10-CM

## 2022-02-02 MED ORDER — IRBESARTAN 150 MG PO TABS: 150.0000 mg | ORAL_TABLET | Freq: Every day | ORAL | 0 refills | Status: DC

## 2022-02-04 DIAGNOSIS — G4733 Obstructive sleep apnea (adult) (pediatric): Secondary | ICD-10-CM | POA: Diagnosis not present

## 2022-02-06 ENCOUNTER — Ambulatory Visit (INDEPENDENT_AMBULATORY_CARE_PROVIDER_SITE_OTHER): Payer: Medicare Other | Admitting: Family Medicine

## 2022-02-06 ENCOUNTER — Encounter: Payer: Self-pay | Admitting: Family Medicine

## 2022-02-06 VITALS — BP 132/80 | HR 56 | Ht 71.0 in | Wt 259.0 lb

## 2022-02-06 DIAGNOSIS — E669 Obesity, unspecified: Secondary | ICD-10-CM

## 2022-02-06 DIAGNOSIS — E1169 Type 2 diabetes mellitus with other specified complication: Secondary | ICD-10-CM | POA: Diagnosis not present

## 2022-02-06 DIAGNOSIS — E1159 Type 2 diabetes mellitus with other circulatory complications: Secondary | ICD-10-CM

## 2022-02-06 DIAGNOSIS — I152 Hypertension secondary to endocrine disorders: Secondary | ICD-10-CM | POA: Diagnosis not present

## 2022-02-06 LAB — BAYER DCA HB A1C WAIVED: HB A1C (BAYER DCA - WAIVED): 8.4 % — ABNORMAL HIGH (ref 4.8–5.6)

## 2022-02-06 MED ORDER — METFORMIN HCL 1000 MG PO TABS: 1000.0000 mg | ORAL_TABLET | Freq: Two times a day (BID) | ORAL | 3 refills | Status: DC

## 2022-02-06 MED ORDER — IRBESARTAN 150 MG PO TABS: 150.0000 mg | ORAL_TABLET | Freq: Every day | ORAL | 3 refills | Status: DC

## 2022-02-06 MED ORDER — TAMSULOSIN HCL 0.4 MG PO CAPS: 0.4000 mg | ORAL_CAPSULE | Freq: Every day | ORAL | 3 refills | Status: DC

## 2022-02-06 MED ORDER — GABAPENTIN 100 MG PO CAPS: 200.0000 mg | ORAL_CAPSULE | Freq: Every day | ORAL | 3 refills | Status: DC

## 2022-02-06 NOTE — Progress Notes (Signed)
BP 132/80    Pulse (!) 56    Ht 5\' 11"  (1.803 m)    Wt 259 lb (117.5 kg)    SpO2 96%    BMI 36.12 kg/m    Subjective:   Patient ID: [name], male    DOB: [birth year], 71 y.o.   MRN: [id number]  HPI: [name] is a 71 y.o. male presenting on 02/06/2022 for Medical Management of Chronic Issues, Diabetes, and Hypertension   HPI Type 2 diabetes mellitus Patient comes in today for recheck of his diabetes. Patient has been currently taking Trulicity and metformin. Patient is currently on an ACE inhibitor/ARB. Patient has not seen an ophthalmologist this year. Patient denies any issues with their feet. The symptom started onset as an adult hypertension ARE RELATED TO DM, also on Crestor  Hypertension Patient is currently on losartan, and their blood pressure today is 132/80. Patient denies any lightheadedness or dizziness. Patient denies headaches, blurred vision, chest pains, shortness of breath, or weakness. Denies any side effects from medication and is content with current medication.     Relevant past medical, surgical, family and social history reviewed and updated as indicated. Interim medical history since our last visit reviewed. Allergies and medications reviewed and updated.  Review of Systems  Constitutional:  Negative for chills and fever.  Eyes:  Negative for visual disturbance.  Respiratory:  Negative for shortness of breath and wheezing.   Cardiovascular:  Negative for chest pain and leg swelling.  Musculoskeletal:  Negative for back pain and gait problem.  Skin:  Negative for rash.  Neurological:  Negative for dizziness, weakness and light-headedness.  All other systems reviewed and are negative.  Per HPI unless specifically indicated above   Allergies as of 02/06/2022       Reactions   Ramipril Cough        Medication List        Accurate as of February 06, 2022  8:31 AM. If you have any questions, ask your nurse or doctor.          STOP  taking these medications    CENTRUM SILVER 50+MEN PO Stopped by: Fransisca Kaufmann Skylah Delauter, MD       TAKE these medications    aspirin EC 81 MG tablet Take 81 mg by mouth every evening.   ferrous sulfate 325 (65 FE) MG tablet Take 325 mg by mouth daily with breakfast.   gabapentin 100 MG capsule Commonly known as: NEURONTIN Take 2 capsules (200 mg total) by mouth at bedtime.   glucose blood test strip Use to check blood glucose once daily at varying times.  Dispense Verio One Touch Test strips   hydrochlorothiazide 25 MG tablet Commonly known as: HYDRODIURIL Take 25 mg by mouth daily.   hydrOXYzine 25 MG tablet Commonly known as: ATARAX Take 25 mg by mouth as needed. Cuts in half as needed   irbesartan 150 MG tablet Commonly known as: AVAPRO Take 1 tablet (150 mg total) by mouth daily.   metFORMIN 1000 MG tablet Commonly known as: GLUCOPHAGE Take 1 tablet (1,000 mg total) by mouth 2 (two) times daily with a meal.   OneTouch Delica Lancets 40J Misc Use to check blood glucose once daily at varying times   PROBIOTIC PO Take 1 capsule by mouth 3 (three) times a week.   rosuvastatin 5 MG tablet Commonly known as: Crestor Take 1 tablet (5 mg total) by mouth at bedtime.   tamsulosin 0.4 MG Caps capsule Commonly known  as: FLOMAX Take 1 capsule (0.4 mg total) by mouth daily after supper.   Trulicity 1.5 QH/4.7ML Sopn Generic drug: Dulaglutide Inject 1.5 mg into the skin once a week.   vitamin B-12 500 MCG tablet Commonly known as: CYANOCOBALAMIN Take 500 mcg by mouth daily.         Objective:   BP 132/80    Pulse (!) 56    Ht 5\' 11"  (1.803 m)    Wt 259 lb (117.5 kg)    SpO2 96%    BMI 36.12 kg/m   Wt Readings from Last 3 Encounters:  02/06/22 259 lb (117.5 kg)  12/25/21 259 lb 9.6 oz (117.8 kg)  11/05/21 259 lb (117.5 kg)    Physical Exam Vitals and nursing note reviewed.  Constitutional:      General: He is not in acute distress.    Appearance: He is  well-developed. He is not diaphoretic.  Eyes:     General: No scleral icterus.    Conjunctiva/sclera: Conjunctivae normal.  Neck:     Thyroid: No thyromegaly.  Cardiovascular:     Rate and Rhythm: Normal rate and regular rhythm.     Heart sounds: Normal heart sounds. No murmur heard. Pulmonary:     Effort: Pulmonary effort is normal. No respiratory distress.     Breath sounds: Normal breath sounds. No wheezing.  Musculoskeletal:        General: Normal range of motion.     Cervical back: Neck supple.  Lymphadenopathy:     Cervical: No cervical adenopathy.  Skin:    General: Skin is warm and dry.     Findings: No rash.  Neurological:     Mental Status: He is alert and oriented to person, place, and time.     Coordination: Coordination normal.  Psychiatric:        Behavior: Behavior normal.      Assessment & Plan:   Problem List Items Addressed This Visit       Cardiovascular and Mediastinum   Hypertension associated with diabetes ([place])   Relevant Medications   irbesartan (AVAPRO) 150 MG tablet   metFORMIN (GLUCOPHAGE) 1000 MG tablet     Endocrine   Diabetes mellitus type 2 in obese ([facility]) - Primary   Relevant Medications   gabapentin (NEURONTIN) 100 MG capsule   irbesartan (AVAPRO) 150 MG tablet   metFORMIN (GLUCOPHAGE) 1000 MG tablet   Other Relevant Orders   Bayer DCA Hb A1c Waived    A1c is much improved, down from 14 to 8.4.  Continue current medicine.  Continue with diet.  He is losing weight and has changed his diet.  Continue. Follow up plan: Return in about 3 months (around 05/06/2022), or if symptoms worsen or fail to improve, for Diabetes recheck.  Counseling provided for all of the vaccine components Orders Placed This Encounter  Procedures   Bayer [place] Hb A1c [place] Juliene Kirsh, MD [facility] 02/06/2022, 8:31 AM

## 2022-02-17 ENCOUNTER — Encounter (HOSPITAL_COMMUNITY): Payer: Self-pay | Admitting: Hematology

## 2022-02-17 ENCOUNTER — Ambulatory Visit (INDEPENDENT_AMBULATORY_CARE_PROVIDER_SITE_OTHER): Payer: Medicare Other | Admitting: Pharmacist

## 2022-02-17 DIAGNOSIS — E669 Obesity, unspecified: Secondary | ICD-10-CM

## 2022-02-17 DIAGNOSIS — E1159 Type 2 diabetes mellitus with other circulatory complications: Secondary | ICD-10-CM

## 2022-02-17 DIAGNOSIS — E1169 Type 2 diabetes mellitus with other specified complication: Secondary | ICD-10-CM

## 2022-02-17 NOTE — Patient Instructions (Signed)
Visit Information ? ?Following are the goals we discussed today:  ?Current Barriers:  ?Unable to independently afford treatment regimen ?Unable to achieve control of T2DM  ?Suboptimal therapeutic regimen for T2DM ? ? ?Pharmacist Clinical Goal(s):  ?patient will verbalize ability to afford treatment regimen ?achieve control of T2DM as evidenced by GOAL A1C<7%, IMPROVED GLYCEMIC CONTROL through collaboration with PharmD and provider.  ? ?Interventions: ?1:1 collaboration with Dettinger, Fransisca Kaufmann, MD regarding development and update of comprehensive plan of care as evidenced by provider attestation and co-signature ?Inter-disciplinary care team collaboration (see longitudinal plan of care) ?Comprehensive medication review performed; medication list updated in electronic medical record ? ?Diabetes: Goal on Track (progressing): YES. ?Uncontrolled--A1C >14%-->8.4% (MUCH IMPROVED); GFR >60 ?Current treatment: TRULICITY 1.'5MG'$  SQ WEEKLY, METFORMIN 1G TWICE DAILY;  ?CONTINUE TRULICITY TO 1.'5MG'$   ?WILL CONTINUE TO TITRATE AS TOLERATED ?EXPERIENCING SOME GI SIDE EFFECTS-->WILL PLAN TO INCREASE TO TRULICITY '3MG'$  WEEKLY IN THE NEXT 1-2 MONTHS ?Denies personal and family history of Medullary thyroid cancer (MTC) ?APPLICATION SUBMITTED TO [facility] PATIENT ASSISTANCE PROGRAM FOR TRULICITY (PATIENT CAN'T AFFORD) ?STILL AWAITING APPROVAL (SUBMITTED ON 12/26/21-SENT TO [place], [facility] TO REVIEW; APPRECIATE ASSISTANCE) ?MAY HAVE TO ADD INSULIN ?Current glucose readings: fasting glucose: >200, post prandial glucose: NOT CHECKING ?Discussed meal planning options and Plate method for healthy eating ?PATIENT REPORTS HE HAS CUT OUT FAST FOOD, HE IS DRINKING MORE WATER, EATING LESS/HEALTHIER CHOICES (I/E SALADS) ?Avoid sugary drinks and desserts ?Incorporate balanced protein, non starchy veggies, 1 serving of carbohydrate with each meal ?Increase water intake ?Increase physical activity as able ?Current exercise: N/A; ENCOURAGED ?Recommended  INCREASE TRULICITY IN 1-2 MONTHS, MAY ADD SGLT2 AT F/U AS WELL ?Assessed patient finances. [facility] PATIENT ASSISTANCE ? ?Hyperlipidemia:  Goal on Track (progressing): YES. ?controlled; current treatment:ROSUVASTATIN ?Medications previously tried: N/A  ?Current dietary patterns: ENCOURAGED HEART HEALTHY, MEDITERRANEAN  ?Counseled on DIETARY CHANGES; CONTINUE CURRENT THERAPY ?Lipid Panel  ?   ?Component Value Date/Time  ? CHOL 102 11/05/2021 0801  ? TRIG 171 (H) 11/05/2021 0801  ? HDL 31 (L) 11/05/2021 0801  ? CHOLHDL 3.3 11/05/2021 0801  ? CHOLHDL 4.1 09/20/2020 0921  ? VLDL 15 09/20/2020 0921  ? [place] 42 11/05/2021 0801  ? LABVLDL 29 11/05/2021 0801  ? ? ?Patient Goals/Self-Care Activities ?patient will:  ?- take medications as prescribed as evidenced by patient report and record review ?check glucose DAILY, document, and provide at future appointments ?collaborate with provider on medication access solutions ?target a minimum of 150 minutes of moderate intensity exercise weekly ?engage in dietary modifications by [place] ? ? ?Plan: Telephone follow up appointment with care management team member scheduled for:  1 MONTH ? ?Signature ?Regina Eck, PharmD, BCPS ?Clinical Pharmacist, [facility] ?[place]  [place] Phone [phone] ? ? ?Please call the care guide team at [phone] if you need to cancel or reschedule your appointment.  ? ?Patient verbalizes understanding of instructions and care plan provided today and agrees to view in [place]. Active MyChart status confirmed with patient.   ? ?

## 2022-02-17 NOTE — Progress Notes (Signed)
Chronic Care Management Pharmacy Note  02/17/2022 Name:  Brelan Hannen MRN:  [id number] DOB:  1951/05/18  Summary: T2DM, HLD  Recommendations/Changes made from today's visit:  Diabetes: Goal on Track (progressing): YES. Uncontrolled--A1C >14%-->8.4% (MUCH IMPROVED); GFR >60 Current treatment: TRULICITY 6.2BJ SQ WEEKLY, METFORMIN 1G TWICE DAILY;  CONTINUE TRULICITY TO 6.2GB  WILL CONTINUE TO TITRATE AS TOLERATED EXPERIENCING SOME GI SIDE EFFECTS-->WILL PLAN TO INCREASE TO TRULICITY 3MG WEEKLY IN THE NEXT 1-2 MONTHS; WILL HAVE TO SUBMIT DOSE CHANGE REQUEST FORM TO [facility] Denies personal and family history of Medullary thyroid cancer (MTC) APPLICATION SUBMITTED TO [facility] PATIENT ASSISTANCE PROGRAM FOR TRULICITY (PATIENT CAN'T AFFORD) STILL AWAITING APPROVAL (SUBMITTED ON 12/26/21-SENT TO [place], [facility] TO REVIEW; APPRECIATE ASSISTANCE) MAY HAVE TO ADD INSULIN Current glucose readings: fasting glucose: >200, post prandial glucose: NOT CHECKING Discussed meal planning options and Plate method for healthy eating PATIENT REPORTS HE HAS CUT OUT FAST FOOD, HE IS DRINKING MORE WATER, EATING LESS/HEALTHIER CHOICES (I/E SALADS)  Subjective: Hershall Benkert is an 71 y.o. year old male who is a primary patient of Dettinger, Fransisca Kaufmann, MD.  The CCM team was consulted for assistance with disease management and care coordination needs.    Engaged with patient by telephone for follow up visit in response to provider referral for pharmacy case management and/or care coordination services.   Consent to Services:  The patient was given information about Chronic Care Management services, agreed to services, and gave verbal consent prior to initiation of services.  Please see initial visit note for detailed documentation.   Patient Care Team: Dettinger, Fransisca Kaufmann, MD as PCP - General (Family Medicine) Glennie [place], NP-C (Inactive) as Nurse Practitioner (Hematology and Oncology) Franchot Gallo, MD as Consulting Physician (Urology) Lavera Guise, [facility] as Pharmacist (Family Medicine)  Objective:  Lab Results  Component Value Date   CREATININE 1.38 (H) 11/05/2021   CREATININE 1.22 07/24/2021   CREATININE 1.19 03/06/2021    Lab Results  Component Value Date   HGBA1C 8.4 (H) 02/06/2022   Last diabetic Eye exam:  Lab Results  Component Value Date/Time   HMDIABEYEEXA No Retinopathy 09/17/2020 12:00 AM    Last diabetic Foot exam: No results found for: HMDIABFOOTEX      Component Value Date/Time   CHOL 102 11/05/2021 0801   TRIG 171 (H) 11/05/2021 0801   HDL 31 (L) 11/05/2021 0801   CHOLHDL 3.3 11/05/2021 0801   CHOLHDL 4.1 09/20/2020 0921   VLDL 15 09/20/2020 0921   LDLCALC 42 11/05/2021 0801    Hepatic Function Latest Ref Rng & Units 11/05/2021 07/24/2021 03/06/2021  Total Protein 6.0 - 8.5 g/dL 7.2 7.4 7.3  Albumin 3.8 - 4.8 g/dL 4.3 4.6 3.9  AST 0 - 40 IU/L _0 ALT 0 - 44 IU/L _1 Alk Phosphatase 44 - 121 IU/L 126(H) 74 63  Total Bilirubin 0.0 - 1.2 mg/dL 0.5 0.7 0.9    Lab Results  Component Value Date/Time   TSH 2.389 10/19/2019 12:40 PM   TSH 4.010 09/08/2019 10:54 AM   TSH 4.370 05/03/2018 11:21 AM    CBC Latest Ref Rng & Units 12/25/2021 11/05/2021 10/29/2021  WBC 4.0 - 10.5 K/uL 6.0 5.5 7.2  Hemoglobin 13.0 - 17.0 g/dL 13.3 13.5 13.7  Hematocrit 39.0 - 52.0 % 44.0 43.3 43.9  Platelets 150 - 400 K/uL 293 265 274    Lab Results  Component Value Date/Time   VD25OH 36.36 09/20/2020 09:21  AM   VD25OH 31.92 08/02/2020 09:30 AM    Clinical ASCVD: No  The ASCVD Risk score (Arnett DK, et al., 2019) failed to calculate for the following reasons:   The valid total cholesterol range is 130 to 320 mg/dL    Other: (CHADS2VASc if Afib, PHQ9 if depression, MMRC or CAT for COPD, ACT, DEXA)  Social History   Tobacco Use  Smoking Status Never  Smokeless Tobacco Never   BP Readings from Last 3 Encounters:  02/06/22 132/80   12/25/21 128/85  11/05/21 121/70   Pulse Readings from Last 3 Encounters:  02/06/22 (!) 56  12/25/21 66  11/05/21 88   Wt Readings from Last 3 Encounters:  02/06/22 259 lb (117.5 kg)  12/25/21 259 lb 9.6 oz (117.8 kg)  11/05/21 259 lb (117.5 kg)    Assessment: Review of patient past medical history, allergies, medications, health status, including review of consultants reports, laboratory and other test data, was performed as part of comprehensive evaluation and provision of chronic care management services.   SDOH:  (Social Determinants of Health) assessments and interventions performed:    CCM Care Plan  Allergies  Allergen Reactions   Ramipril Cough    Medications Reviewed Today     Reviewed by Lavera Guise, [facility] (Pharmacist) on 02/17/22 at 35  Med List Status: <None>   Medication Order Taking? Sig Documenting Provider Last Dose Status Informant  aspirin EC 81 MG tablet [id number] No Take 81 mg by mouth every evening. [provider] Taking Active Self  Dulaglutide (TRULICITY) 1.5 WG/9.5AO SOPN [id number] No Inject 1.5 mg into the skin once a week. Dettinger, Fransisca Kaufmann, MD Taking Active   ferrous sulfate 325 (65 FE) MG tablet [id number] No Take 325 mg by mouth daily with breakfast. [provider] Taking Active   gabapentin (NEURONTIN) 100 MG capsule [id number]  Take 2 capsules (200 mg total) by mouth at bedtime. Dettinger, Fransisca Kaufmann, MD  Active   glucose blood test strip [id number] No Use to check blood glucose once daily at varying times.  Dispense Verio One Touch Test strips Eckard, Tammy, RPH-CPP Taking Active Self  hydrochlorothiazide (HYDRODIURIL) 25 MG tablet [id number] No Take 25 mg by mouth daily. [provider] Taking Active   hydrOXYzine (ATARAX/VISTARIL) 25 MG tablet [id number] No Take 25 mg by mouth as needed. Cuts in half as needed [provider] Taking Active   irbesartan (AVAPRO) 150 MG tablet [id number]  Take 1 tablet (150 mg  total) by mouth daily. Dettinger, Fransisca Kaufmann, MD  Active   metFORMIN (GLUCOPHAGE) 1000 MG tablet [id number]  Take 1 tablet (1,000 mg total) by mouth 2 (two) times daily with a meal. Dettinger, Fransisca Kaufmann, MD  Active   [facility] LANCETS 93A MISC [id number] No Use to check blood glucose once daily at varying times Eckard, Tammy, RPH-CPP Taking Active Self  Probiotic Product (PROBIOTIC PO) [id number] No Take 1 capsule by mouth 3 (three) times a week.  [provider] Taking Active Self  rosuvastatin (CRESTOR) 5 MG tablet [id number] No Take 1 tablet (5 mg total) by mouth at bedtime. Dettinger, Fransisca Kaufmann, MD Taking Active   tamsulosin [facility]) 0.4 MG CAPS capsule [id number]  Take 1 capsule (0.4 mg total) by mouth daily after supper. Dettinger, Fransisca Kaufmann, MD  Active   vitamin B-12 (CYANOCOBALAMIN) 500 MCG tablet [id number] No Take 500 mcg by mouth daily. [provider] Taking Active             Patient  Active Problem List   Diagnosis Date Noted   Polycythemia, secondary 04/12/2019   History of colonic polyps 01/23/2019   History of rectal bleeding 08/25/2018   OSA on CPAP 06/21/2017   Diabetes mellitus type 2 in obese ([place]) 10/22/2016   Elevated PSA 10/22/2016   Hypertension associated with diabetes ([place]) 02/03/2011    Immunization History  Administered Date(s) Administered   Fluad Quad(high Dose 65+) 10/10/2019, 08/20/2020, 10/14/2021   Influenza,inj,Quad PF,6+ Mos 09/25/2014, 10/22/2016, 09/22/2017, 09/23/2018   Influenza-Unspecified 08/20/2020   Moderna Sars-Covid-2 Vaccination 01/25/2020, 02/22/2020   Pneumococcal Conjugate-13 09/27/2020   Pneumococcal Polysaccharide-23 10/14/2021   Tdap 09/25/2014   Zoster Recombinat (Shingrix) 04/20/2018    Conditions to be addressed/monitored: HLD and DMII  Care Plan : PHARMD MEDICATION MANAGEMENT  Updates made by Lavera Guise, [place] since 02/17/2022 12:00 AM     Problem: DISEASE PROGRESSION PREVENTION      Long-Range Goal:  T2DM PHARMD GOAL   Recent Progress: Not on track  Priority: High  Note:   Current Barriers:  Unable to independently afford treatment regimen Unable to achieve control of T2DM  Suboptimal therapeutic regimen for T2DM   Pharmacist Clinical Goal(s):  patient will verbalize ability to afford treatment regimen achieve control of T2DM as evidenced by GOAL A1C<7%, IMPROVED GLYCEMIC CONTROL  through collaboration with PharmD and provider.   Interventions: 1:1 collaboration with Dettinger, Fransisca Kaufmann, MD regarding development and update of comprehensive plan of care as evidenced by provider attestation and co-signature Inter-disciplinary care team collaboration (see longitudinal plan of care) Comprehensive medication review performed; medication list updated in electronic medical record  Diabetes: Goal on Track (progressing): YES. Uncontrolled--A1C >14%-->8.4% (MUCH IMPROVED); GFR >60 Current treatment: TRULICITY 3.1VQ SQ WEEKLY, METFORMIN 1G TWICE DAILY;  CONTINUE TRULICITY TO 0.0QQ  WILL CONTINUE TO TITRATE AS TOLERATED EXPERIENCING SOME GI SIDE EFFECTS-->WILL PLAN TO INCREASE TO TRULICITY 3MG WEEKLY IN THE NEXT 1-2 MONTHS Denies personal and family history of Medullary thyroid cancer (MTC) APPLICATION SUBMITTED TO [facility] PATIENT ASSISTANCE PROGRAM FOR TRULICITY (PATIENT CAN'T AFFORD) STILL AWAITING APPROVAL (SUBMITTED ON 12/26/21-SENT TO [place], [facility] TO REVIEW; APPRECIATE ASSISTANCE) MAY HAVE TO ADD INSULIN Current glucose readings: fasting glucose: >200, post prandial glucose: NOT CHECKING Discussed meal planning options and Plate method for healthy eating PATIENT REPORTS HE HAS CUT OUT FAST FOOD, HE IS DRINKING MORE WATER, EATING LESS/HEALTHIER CHOICES (I/E SALADS) Avoid sugary drinks and desserts Incorporate balanced protein, non starchy veggies, 1 serving of carbohydrate with each meal Increase water intake Increase physical activity as able Current exercise: N/A;  ENCOURAGED Recommended INCREASE TRULICITY IN 1-2 MONTHS, MAY ADD SGLT2 AT F/U AS WELL Assessed patient finances. [facility] PATIENT ASSISTANCE  Hyperlipidemia:  Goal on Track (progressing): YES. controlled; current treatment:ROSUVASTATIN Medications previously tried: N/A  Current dietary patterns: ENCOURAGED HEART HEALTHY, MEDITERRANEAN  Counseled on DIETARY CHANGES; CONTINUE CURRENT THERAPY Lipid Panel     Component Value Date/Time   CHOL 102 11/05/2021 0801   TRIG 171 (H) 11/05/2021 0801   HDL 31 (L) 11/05/2021 0801   CHOLHDL 3.3 11/05/2021 0801   CHOLHDL 4.1 09/20/2020 0921   VLDL 15 09/20/2020 0921   LDLCALC 42 11/05/2021 0801   LABVLDL 29 11/05/2021 0801   Patient Goals/Self-Care Activities patient will:  - take medications as prescribed as evidenced by patient report and record review check glucose DAILY, document, and provide at future appointments collaborate with provider on medication access solutions target a minimum of 150 minutes of moderate intensity exercise weekly engage in dietary  modifications by HEART HEALTHY, HEALTHY PLATE METHOD FOR DIABETES      Medication Assistance: Application for TRULICITY/[facility]  medication assistance program. in process.  Anticipated assistance start date TBD.  See plan of care for additional detail.  Patient's preferred pharmacy is:  [facility], [place] [address] [place] [address] Phone: [phone] Fax: [phone]  Follow Up:  Patient agrees to Care Plan and Follow-up.  Plan: Telephone follow up appointment with care management team member scheduled for:  1 MONTH   Regina Eck, PharmD, BCPS Clinical Pharmacist, [place]  II Phone [phone]

## 2022-02-19 ENCOUNTER — Inpatient Hospital Stay (HOSPITAL_COMMUNITY): Payer: Medicare Other | Attending: Hematology

## 2022-02-19 ENCOUNTER — Encounter (HOSPITAL_COMMUNITY): Payer: Self-pay

## 2022-02-19 ENCOUNTER — Encounter (HOSPITAL_COMMUNITY): Payer: Self-pay | Admitting: Hematology

## 2022-02-19 ENCOUNTER — Other Ambulatory Visit: Payer: Self-pay

## 2022-02-19 ENCOUNTER — Inpatient Hospital Stay (HOSPITAL_COMMUNITY): Payer: Medicare Other

## 2022-02-19 DIAGNOSIS — D751 Secondary polycythemia: Secondary | ICD-10-CM

## 2022-02-19 LAB — CBC WITH DIFFERENTIAL/PLATELET
Abs Immature Granulocytes: 0.03 10*3/uL (ref 0.00–0.07)
Basophils Absolute: 0.1 10*3/uL (ref 0.0–0.1)
Basophils Relative: 1 %
Eosinophils Absolute: 0.2 10*3/uL (ref 0.0–0.5)
Eosinophils Relative: 2 %
HCT: 50.9 % (ref 39.0–52.0)
Hemoglobin: 16.7 g/dL (ref 13.0–17.0)
Immature Granulocytes: 0 %
Lymphocytes Relative: 16 %
Lymphs Abs: 1.4 10*3/uL (ref 0.7–4.0)
MCH: 28.3 pg (ref 26.0–34.0)
MCHC: 32.8 g/dL (ref 30.0–36.0)
MCV: 86.1 fL (ref 80.0–100.0)
Monocytes Absolute: 0.7 10*3/uL (ref 0.1–1.0)
Monocytes Relative: 8 %
Neutro Abs: 6.1 10*3/uL (ref 1.7–7.7)
Neutrophils Relative %: 73 %
Platelets: 268 10*3/uL (ref 150–400)
RBC: 5.91 MIL/uL — ABNORMAL HIGH (ref 4.22–5.81)
RDW: 21 % — ABNORMAL HIGH (ref 11.5–15.5)
WBC: 8.4 10*3/uL (ref 4.0–10.5)
nRBC: 0 % (ref 0.0–0.2)

## 2022-02-19 NOTE — Patient Instructions (Signed)
[facility]  Discharge Instructions: ?Thank you for choosing [facility] to provide your oncology and hematology care.  ?If you have a lab appointment with the [facility], please come in thru the Main Entrance and check in at the main information desk. ? ?Wear comfortable clothing and clothing appropriate for easy access to any Portacath or PICC line.  ? ?We strive to give you quality time with your provider. You may need to reschedule your appointment if you arrive late (15 or more minutes).  Arriving late affects you and other patients whose appointments are after yours.  Also, if you miss three or more appointments without notifying the office, you may be dismissed from the clinic at the provider?s discretion.    ?  ?For prescription refill requests, have your pharmacy contact our office and allow 72 hours for refills to be completed.   ? ?Today a phlebotomy was performed, return as scheduled. ?  ?To help prevent nausea and vomiting after your treatment, we encourage you to take your nausea medication as directed. ? ?BELOW ARE SYMPTOMS THAT SHOULD BE REPORTED IMMEDIATELY: ?*FEVER GREATER THAN 100.4 F (38 ?C) OR HIGHER ?*CHILLS OR SWEATING ?*NAUSEA AND VOMITING THAT IS NOT CONTROLLED WITH YOUR NAUSEA MEDICATION ?*UNUSUAL SHORTNESS OF BREATH ?*UNUSUAL BRUISING OR BLEEDING ?*URINARY PROBLEMS (pain or burning when urinating, or frequent urination) ?*BOWEL PROBLEMS (unusual diarrhea, constipation, pain near the anus) ?TENDERNESS IN MOUTH AND THROAT WITH OR WITHOUT PRESENCE OF ULCERS (sore throat, sores in mouth, or a toothache) ?UNUSUAL RASH, SWELLING OR PAIN  ?UNUSUAL VAGINAL DISCHARGE OR ITCHING  ? ?Items with * indicate a potential emergency and should be followed up as soon as possible or go to the Emergency Department if any problems should occur. ? ?Please show the CHEMOTHERAPY ALERT CARD or IMMUNOTHERAPY ALERT CARD at check-in to the Emergency Department and triage nurse. ? ?Should  you have questions after your visit or need to cancel or reschedule your appointment, please contact [facility] [phone]  and follow the prompts.  Office hours are 8:00 a.m. to 4:30 p.m. Monday - Friday. Please note that voicemails left after 4:00 p.m. may not be returned until the following business day.  We are closed weekends and major holidays. You have access to a nurse at all times for urgent questions. Please call the main number to the clinic [phone] and follow the prompts. ? ?For any non-urgent questions, you may also contact your provider using MyChart. We now offer e-Visits for anyone 72 and older to request care online for non-urgent symptoms. For details visit [email or web address]. ?  ?Also download the MyChart app! Go to the app store, search "MyChart", open the app, select [facility] [place], and log in with your MyChart username and password. ? ?Due to Covid, a mask is required upon entering the hospital/clinic. If you do not have a mask, one will be given to you upon arrival. For doctor visits, patients may have 1 support person aged 46 or older with them. For treatment visits, patients cannot have anyone with them due to current Covid guidelines and our immunocompromised population.  ?

## 2022-02-19 NOTE — Progress Notes (Signed)
[name] presents today for phlebotomy per MD orders. ?Phlebotomy procedure started at 1355 and ended at 1403. ?500 cc removed. ?Patient tolerated procedure well. ?IV needle removed intact. ? ? ?

## 2022-03-10 ENCOUNTER — Ambulatory Visit: Payer: Medicare Other | Admitting: Pharmacist

## 2022-03-13 ENCOUNTER — Encounter: Payer: Self-pay | Admitting: Emergency Medicine

## 2022-03-13 ENCOUNTER — Ambulatory Visit (INDEPENDENT_AMBULATORY_CARE_PROVIDER_SITE_OTHER): Payer: Medicare Other

## 2022-03-13 ENCOUNTER — Other Ambulatory Visit: Payer: Self-pay

## 2022-03-13 ENCOUNTER — Ambulatory Visit
Admission: EM | Admit: 2022-03-13 | Discharge: 2022-03-13 | Disposition: A | Discharge status: Home / Self Care | Payer: Medicare Other | Attending: Urgent Care | Admitting: Urgent Care

## 2022-03-13 DIAGNOSIS — E1169 Type 2 diabetes mellitus with other specified complication: Secondary | ICD-10-CM | POA: Diagnosis not present

## 2022-03-13 DIAGNOSIS — E785 Hyperlipidemia, unspecified: Secondary | ICD-10-CM | POA: Diagnosis not present

## 2022-03-13 DIAGNOSIS — M436 Torticollis: Secondary | ICD-10-CM

## 2022-03-13 DIAGNOSIS — G44209 Tension-type headache, unspecified, not intractable: Secondary | ICD-10-CM | POA: Diagnosis not present

## 2022-03-13 DIAGNOSIS — M542 Cervicalgia: Secondary | ICD-10-CM | POA: Diagnosis not present

## 2022-03-13 DIAGNOSIS — Z041 Encounter for examination and observation following transport accident: Secondary | ICD-10-CM | POA: Diagnosis not present

## 2022-03-13 DIAGNOSIS — R109 Unspecified abdominal pain: Secondary | ICD-10-CM | POA: Diagnosis not present

## 2022-03-13 MED ORDER — ACETAMINOPHEN 325 MG PO TABS: 650.0000 mg | ORAL_TABLET | Freq: Four times a day (QID) | ORAL | 0 refills | Status: AC | PRN

## 2022-03-13 MED ORDER — TIZANIDINE HCL 4 MG PO TABS: 4.0000 mg | ORAL_TABLET | Freq: Every day | ORAL | 0 refills | Status: DC
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
## 2022-04-16 ENCOUNTER — Inpatient Hospital Stay (HOSPITAL_COMMUNITY): Payer: Medicare Other

## 2022-04-16 ENCOUNTER — Inpatient Hospital Stay (HOSPITAL_COMMUNITY): Payer: Medicare Other | Attending: Hematology

## 2022-04-16 DIAGNOSIS — D751 Secondary polycythemia: Secondary | ICD-10-CM | POA: Insufficient documentation

## 2022-04-16 LAB — CBC WITH DIFFERENTIAL/PLATELET
Abs Immature Granulocytes: 0.02 10*3/uL (ref 0.00–0.07)
Basophils Absolute: 0.1 10*3/uL (ref 0.0–0.1)
Basophils Relative: 1 %
Eosinophils Absolute: 0.2 10*3/uL (ref 0.0–0.5)
Eosinophils Relative: 3 %
HCT: 50.8 % (ref 39.0–52.0)
Hemoglobin: 17 g/dL (ref 13.0–17.0)
Immature Granulocytes: 0 %
Lymphocytes Relative: 23 %
Lymphs Abs: 1.6 10*3/uL (ref 0.7–4.0)
MCH: 29.9 pg (ref 26.0–34.0)
MCHC: 33.5 g/dL (ref 30.0–36.0)
MCV: 89.3 fL (ref 80.0–100.0)
Monocytes Absolute: 0.6 10*3/uL (ref 0.1–1.0)
Monocytes Relative: 8 %
Neutro Abs: 4.7 10*3/uL (ref 1.7–7.7)
Neutrophils Relative %: 65 %
Platelets: 218 10*3/uL (ref 150–400)
RBC: 5.69 MIL/uL (ref 4.22–5.81)
RDW: 15.8 % — ABNORMAL HIGH (ref 11.5–15.5)
WBC: 7.2 10*3/uL (ref 4.0–10.5)
nRBC: 0 % (ref 0.0–0.2)

## 2022-04-16 NOTE — Progress Notes (Signed)
[name] presents today for phlebotomy per MD orders. ?Phlebotomy procedure started at 1420 and ended at 24. ?500 mls removed. ?Patient observed for 5 minutes after procedure without any incident. Patient refuses to stay his whole post wait time.  ?Patient tolerated procedure well. ?IV needle removed intact. ? ? ?

## 2022-04-18 ENCOUNTER — Other Ambulatory Visit: Payer: Self-pay | Admitting: Family Medicine

## 2022-04-18 DIAGNOSIS — E1169 Type 2 diabetes mellitus with other specified complication: Secondary | ICD-10-CM

## 2022-05-21 ENCOUNTER — Encounter: Payer: Self-pay | Admitting: Family Medicine

## 2022-05-21 ENCOUNTER — Ambulatory Visit (INDEPENDENT_AMBULATORY_CARE_PROVIDER_SITE_OTHER): Payer: Medicare Other | Admitting: Family Medicine

## 2022-05-21 VITALS — BP 119/69 | HR 62 | Temp 97.0°F | Ht 70.0 in | Wt 252.0 lb

## 2022-05-21 DIAGNOSIS — E119 Type 2 diabetes mellitus without complications: Secondary | ICD-10-CM

## 2022-05-21 DIAGNOSIS — I152 Hypertension secondary to endocrine disorders: Secondary | ICD-10-CM

## 2022-05-21 DIAGNOSIS — G4709 Other insomnia: Secondary | ICD-10-CM

## 2022-05-21 DIAGNOSIS — E7800 Pure hypercholesterolemia, unspecified: Secondary | ICD-10-CM

## 2022-05-21 DIAGNOSIS — E1159 Type 2 diabetes mellitus with other circulatory complications: Secondary | ICD-10-CM

## 2022-05-21 DIAGNOSIS — E1169 Type 2 diabetes mellitus with other specified complication: Secondary | ICD-10-CM

## 2022-05-21 DIAGNOSIS — E669 Obesity, unspecified: Secondary | ICD-10-CM

## 2022-05-21 LAB — BAYER DCA HB A1C WAIVED: HB A1C (BAYER DCA - WAIVED): 7.6 % — ABNORMAL HIGH (ref 4.8–5.6)

## 2022-05-21 MED ORDER — TRULICITY 1.5 MG/0.5ML ~~LOC~~ SOAJ: SUBCUTANEOUS | 3 refills | Status: DC

## 2022-05-21 MED ORDER — SEMAGLUTIDE (1 MG/DOSE) 4 MG/3ML ~~LOC~~ SOPN: 1.0000 mg | PEN_INJECTOR | SUBCUTANEOUS | 3 refills | Status: DC

## 2022-05-21 MED ORDER — ROSUVASTATIN CALCIUM 5 MG PO TABS: 5.0000 mg | ORAL_TABLET | Freq: Every day | ORAL | 3 refills | Status: DC

## 2022-05-21 MED ORDER — GLUCOSE BLOOD VI STRP: 1.0000 | ORAL_STRIP | Freq: Two times a day (BID) | 3 refills | Status: DC

## 2022-05-21 MED ORDER — TRAZODONE HCL 50 MG PO TABS: 25.0000 mg | ORAL_TABLET | Freq: Every evening | ORAL | 3 refills | Status: DC | PRN

## 2022-05-21 NOTE — Progress Notes (Addendum)
BP 119/69   Pulse 62   Temp (!) 97 F (36.1 C)   Ht 5' 10"  (1.778 m)   Wt 252 lb (114.3 kg)   SpO2 96%   BMI 36.16 kg/m    Subjective:   Patient ID: [name], male    DOB: Feb 14, 1951, 71 y.o.   MRN: [id number]  HPI: [name] is a 71 y.o. male presenting on 05/21/2022 for Medical Management of Chronic Issues, Diabetes, Hypertension, and Insomnia   HPI Type 2 diabetes mellitus Patient comes in today for recheck of his diabetes. Patient has been currently taking metformin, had Trulicity but has been out of it for 2 weeks. Patient is currently on an ACE inhibitor/ARB. Patient has not seen an ophthalmologist this year. Patient denies any issues with their feet. The symptom started onset as an adult hypertension ARE RELATED TO DM   Hypertension Patient is currently on irbesartan, and their blood pressure today is 119/69. Patient denies any lightheadedness or dizziness. Patient denies headaches, blurred vision, chest pains, shortness of breath, or weakness. Denies any side effects from medication and is content with current medication.   Hyperlipidemia Patient is coming in for recheck of his hyperlipidemia. The patient is currently taking Crestor. They deny any issues with myalgias or history of liver damage from it. They deny any focal numbness or weakness or chest pain.   Patient has been having difficulty sleeping, has CPAP but sometimes he has difficulty falling asleep.  Relevant past medical, surgical, family and social history reviewed and updated as indicated. Interim medical history since our last visit reviewed. Allergies and medications reviewed and updated.  Review of Systems  Constitutional:  Negative for chills and fever.  Eyes:  Negative for visual disturbance.  Respiratory:  Negative for shortness of breath and wheezing.   Cardiovascular:  Negative for chest pain and leg swelling.  Musculoskeletal:  Negative for back pain and gait problem.  Skin:   Negative for rash.  Neurological:  Negative for dizziness, weakness and light-headedness.  Psychiatric/Behavioral:  Positive for sleep disturbance.   All other systems reviewed and are negative.   Per HPI unless specifically indicated above   Allergies as of 05/21/2022       Reactions   Sulfa Antibiotics    rash   Ramipril Cough        Medication List        Accurate as of May 21, 2022  8:50 AM. If you have any questions, ask your nurse or doctor.          STOP taking these medications    hydrochlorothiazide 25 MG tablet Commonly known as: HYDRODIURIL Stopped by: Fransisca Kaufmann Simra Fiebig, MD   tiZANidine 4 MG tablet Commonly known as: Zanaflex Stopped by: Worthy Rancher, MD       TAKE these medications    acetaminophen 325 MG tablet Commonly known as: Tylenol Take 2 tablets (650 mg total) by mouth every 6 (six) hours as needed for moderate pain or headache.   amoxicillin 500 MG capsule Commonly known as: AMOXIL Take 500 mg by mouth 3 (three) times daily.   aspirin EC 81 MG tablet Take 81 mg by mouth every evening.   ferrous sulfate 325 (65 FE) MG tablet Take 325 mg by mouth daily with breakfast.   gabapentin 100 MG capsule Commonly known as: NEURONTIN Take 2 capsules (200 mg total) by mouth at bedtime.   glucose blood test strip 1 each by Other route 2 (two) times daily.  Use to check blood glucose once daily at varying times.  Dispense Verio One Touch Test strips What changed:  how much to take how to take this when to take this Changed by: Fransisca Kaufmann Marshel Golubski, MD   hydrOXYzine 25 MG tablet Commonly known as: ATARAX Take 25 mg by mouth as needed. Cuts in half as needed   irbesartan 150 MG tablet Commonly known as: AVAPRO Take 1 tablet (150 mg total) by mouth daily.   metFORMIN 1000 MG tablet Commonly known as: GLUCOPHAGE Take 1 tablet (1,000 mg total) by mouth 2 (two) times daily with a meal.   OneTouch Delica Lancets 18H Misc Use to check  blood glucose once daily at varying times   PROBIOTIC PO Take 1 capsule by mouth 3 (three) times a week.   rosuvastatin 5 MG tablet Commonly known as: Crestor Take 1 tablet (5 mg total) by mouth at bedtime.   tamsulosin 0.4 MG Caps capsule Commonly known as: FLOMAX Take 1 capsule (0.4 mg total) by mouth daily after supper.   traZODone 50 MG tablet Commonly known as: DESYREL Take 0.5-1 tablets (25-50 mg total) by mouth at bedtime as needed for sleep (For insomnia as needed). Started by: Fransisca Kaufmann Liliane Mallis, MD   Trulicity 1.5 UD/1.4HF Sopn Generic drug: Dulaglutide INJECT 1.35m into THE SKIN ONCE WEEKLY   vitamin B-12 500 MCG tablet Commonly known as: CYANOCOBALAMIN Take 500 mcg by mouth daily.         Objective:   BP 119/69   Pulse 62   Temp (!) 97 F (36.1 C)   Ht 5' 10"  (1.778 m)   Wt 252 lb (114.3 kg)   SpO2 96%   BMI 36.16 kg/m   Wt Readings from Last 3 Encounters:  05/21/22 252 lb (114.3 kg)  03/13/22 249 lb (112.9 kg)  02/06/22 259 lb (117.5 kg)    Physical Exam Vitals and nursing note reviewed.  Constitutional:      General: He is not in acute distress.    Appearance: He is well-developed. He is not diaphoretic.  Eyes:     General: No scleral icterus.    Conjunctiva/sclera: Conjunctivae normal.  Neck:     Thyroid: No thyromegaly.  Cardiovascular:     Rate and Rhythm: Normal rate and regular rhythm.     Heart sounds: Normal heart sounds. No murmur heard. Pulmonary:     Effort: Pulmonary effort is normal. No respiratory distress.     Breath sounds: Normal breath sounds. No wheezing.  Musculoskeletal:        General: Normal range of motion.     Cervical back: Neck supple.  Lymphadenopathy:     Cervical: No cervical adenopathy.  Skin:    General: Skin is warm and dry.     Findings: No rash.  Neurological:     Mental Status: He is alert and oriented to person, place, and time.     Coordination: Coordination normal.  Psychiatric:         Behavior: Behavior normal.       Assessment & Plan:   Problem List Items Addressed This Visit       Cardiovascular and Mediastinum   [place]   Relevant Medications   rosuvastatin (CRESTOR) 5 MG tablet   Dulaglutide (TRULICITY) 1.5 MWY/6.3ZCSOPN   Other Relevant Orders   CBC with Differential/Platelet   CMP14+EGFR   Lipid panel   Bayer DCA Hb A1c Waived     Endocrine   Diabetes  mellitus type 2 in obese (HCC) - Primary   Relevant Medications   rosuvastatin (CRESTOR) 5 MG tablet   Dulaglutide (TRULICITY) 1.5 XU/3.9PV SOPN   glucose blood test strip   Other Relevant Orders   CBC with Differential/Platelet   CMP14+EGFR   Lipid panel   Bayer DCA Hb A1c Waived   Other Visit Diagnoses     Pure hypercholesterolemia       Relevant Medications   rosuvastatin (CRESTOR) 5 MG tablet   Other Relevant Orders   CBC with Differential/Platelet   CMP14+EGFR   Lipid panel   Bayer DCA Hb A1c Waived   Other insomnia       Relevant Medications   traZODone (DESYREL) 50 MG tablet       We will try trazodone for sleep.  A1c is improved to 7.6, continue with Trulicity.  He says after each possible he gets some diarrhea but is doing well on it.    Per [facility] the assistance programs for Trulicity are not available for him so we will try for Ozempic. Follow up plan: Return in about 3 months (around 08/21/2022), or if symptoms worsen or fail to improve, for Diabetes hypertension and cholesterol.  Counseling provided for all of the vaccine components Orders Placed This Encounter  Procedures   CBC with Differential/Platelet   CMP14+EGFR   Lipid panel   Bayer DCA Hb A1c Waived    Caryl Pina, MD [facility] 05/21/2022, 8:50 AM

## 2022-05-21 NOTE — Addendum Note (Signed)
Addended by: Caryl Pina on: 05/21/2022 08:53 AM   Modules accepted: Orders

## 2022-05-22 LAB — CBC WITH DIFFERENTIAL/PLATELET
Basophils Absolute: 0.1 10*3/uL (ref 0.0–0.2)
Basos: 1 %
EOS (ABSOLUTE): 0.2 10*3/uL (ref 0.0–0.4)
Eos: 4 %
Hematocrit: 49.5 % (ref 37.5–51.0)
Hemoglobin: 16.7 g/dL (ref 13.0–17.7)
Immature Grans (Abs): 0 10*3/uL (ref 0.0–0.1)
Immature Granulocytes: 0 %
Lymphocytes Absolute: 1.3 10*3/uL (ref 0.7–3.1)
Lymphs: 22 %
MCH: 31.1 pg (ref 26.6–33.0)
MCHC: 33.7 g/dL (ref 31.5–35.7)
MCV: 92 fL (ref 79–97)
Monocytes Absolute: 0.5 10*3/uL (ref 0.1–0.9)
Monocytes: 8 %
Neutrophils Absolute: 3.9 10*3/uL (ref 1.4–7.0)
Neutrophils: 65 %
Platelets: 220 10*3/uL (ref 150–450)
RBC: 5.37 x10E6/uL (ref 4.14–5.80)
RDW: 13.2 % (ref 11.6–15.4)
WBC: 6 10*3/uL (ref 3.4–10.8)

## 2022-05-22 LAB — CMP14+EGFR
ALT: 29 IU/L (ref 0–44)
AST: 34 IU/L (ref 0–40)
Albumin/Globulin Ratio: 1.4 (ref 1.2–2.2)
Albumin: 4.2 g/dL (ref 3.8–4.8)
Alkaline Phosphatase: 79 IU/L (ref 44–121)
BUN/Creatinine Ratio: 16 (ref 10–24)
BUN: 19 mg/dL (ref 8–27)
Bilirubin Total: 1 mg/dL (ref 0.0–1.2)
CO2: 21 mmol/L (ref 20–29)
Calcium: 9.4 mg/dL (ref 8.6–10.2)
Chloride: 100 mmol/L (ref 96–106)
Creatinine, Ser: 1.16 mg/dL (ref 0.76–1.27)
Globulin, Total: 2.9 g/dL (ref 1.5–4.5)
Glucose: 167 mg/dL — ABNORMAL HIGH (ref 70–99)
Potassium: 4.2 mmol/L (ref 3.5–5.2)
Sodium: 138 mmol/L (ref 134–144)
Total Protein: 7.1 g/dL (ref 6.0–8.5)
eGFR: 68 mL/min/{1.73_m2} (ref >59)

## 2022-05-22 LAB — LIPID PANEL
Chol/HDL Ratio: 2.8 ratio (ref 0.0–5.0)
Cholesterol, Total: 99 mg/dL — ABNORMAL LOW (ref 100–199)
HDL: 35 mg/dL — ABNORMAL LOW (ref >39)
LDL Chol Calc (NIH): 46 mg/dL (ref 0–99)
Triglycerides: 93 mg/dL (ref 0–149)
VLDL Cholesterol Cal: 18 mg/dL (ref 5–40)

## 2022-05-26 ENCOUNTER — Ambulatory Visit (INDEPENDENT_AMBULATORY_CARE_PROVIDER_SITE_OTHER): Payer: Medicare Other | Admitting: Pharmacist

## 2022-05-26 DIAGNOSIS — E7800 Pure hypercholesterolemia, unspecified: Secondary | ICD-10-CM

## 2022-05-26 DIAGNOSIS — E1169 Type 2 diabetes mellitus with other specified complication: Secondary | ICD-10-CM

## 2022-05-29 NOTE — Patient Instructions (Addendum)
Visit Information  Following are the goals we discussed today:  Current Barriers:  Unable to independently afford treatment regimen Unable to achieve control of T2DM  Suboptimal therapeutic regimen for T2DM   Pharmacist Clinical Goal(s):  patient will verbalize ability to afford treatment regimen achieve control of T2DM as evidenced by GOAL A1C<7%, IMPROVED GLYCEMIC CONTROL through collaboration with PharmD and provider.   Interventions: 1:1 collaboration with Dettinger, Fransisca Kaufmann, MD regarding development and update of comprehensive plan of care as evidenced by provider attestation and co-signature Inter-disciplinary care team collaboration (see longitudinal plan of care) Comprehensive medication review performed; medication list updated in electronic medical record  Diabetes: Goal on Track (progressing): YES. Uncontrolled--A1C >14%-->8.4%-->7.6% (MUCH IMPROVED); GFR >60 Current treatment: TRULICITY 1.'5MG'$  SQ WEEKLY-->ozempic, METFORMIN 1G TWICE DAILY;  Switch to Ozempic '1mg'$  Sample given Denies personal and family history of Medullary thyroid cancer (MTC) Will apply for patient assitsance via novo nordisk PAP EXPERIENCING SOME GI SIDE EFFECTS Current glucose readings: fasting glucose: >200, post prandial glucose: NOT CHECKING Discussed meal planning options and Plate method for healthy eating PATIENT REPORTS HE HAS CUT OUT FAST FOOD, HE IS DRINKING MORE WATER, EATING LESS/HEALTHIER CHOICES (I/E SALADS) Avoid sugary drinks and desserts Incorporate balanced protein, non starchy veggies, 1 serving of carbohydrate with each meal Increase water intake Increase physical activity as able Current exercise: N/A; ENCOURAGED Recommended switch to ozempic, MAY ADD SGLT2 AT F/U AS WELL Assessed patient finances. Novo nordisk PATIENT ASSISTANCE  Hyperlipidemia:  Goal on Track (progressing): YES. controlled; current treatment:ROSUVASTATIN Medications previously tried: N/A  Current dietary  patterns: ENCOURAGED HEART HEALTHY, MEDITERRANEAN  Counseled on DIETARY CHANGES; CONTINUE CURRENT THERAPY Lipid Panel     Component Value Date/Time   CHOL 102 11/05/2021 0801   TRIG 171 (H) 11/05/2021 0801   HDL 31 (L) 11/05/2021 0801   CHOLHDL 3.3 11/05/2021 0801   CHOLHDL 4.1 09/20/2020 0921   VLDL 15 09/20/2020 0921   LDLCALC 42 11/05/2021 0801   LABVLDL 29 11/05/2021 0801    Patient Goals/Self-Care Activities patient will:  - take medications as prescribed as evidenced by patient report and record review check glucose DAILY, document, and provide at future appointments collaborate with provider on medication access solutions target a minimum of 150 minutes of moderate intensity exercise weekly engage in dietary modifications by HEART HEALTHY, HEALTHY PLATE METHOD FOR DIABETES   Plan: Telephone follow up appointment with care management team member scheduled for:  next week  Signature Regina Eck, PharmD, BCPS Clinical Pharmacist, [facility] Phone [phone]   Please call the care guide team at [phone] if you need to cancel or reschedule your appointment.   The patient verbalized understanding of instructions, educational materials, and care plan provided today and DECLINED offer to receive copy of patient instructions, educational materials, and care plan.

## 2022-05-29 NOTE — Progress Notes (Signed)
Chronic Care Management Pharmacy Note  05/26/2022 Name:  [name] MRN:  [id number] DOB:  November 17, 1951  Summary:  Diabetes: Goal on Track (progressing): YES. Uncontrolled--A1C >14%-->8.4%-->7.6% (MUCH IMPROVED); GFR >60 Current treatment: TRULICITY 8.5FY SQ WEEKLY-->ozempic, METFORMIN 1G TWICE DAILY;  Switch to Ozempic 755m Sample given--start at 0.550msq weekly, then increase to 55m52meekly Denies personal and family history of Medullary thyroid cancer (MTC) Will apply for patient assitsance via novo nordisk PAP EXPERIENCING SOME GI SIDE EFFECTS Current glucose readings: fasting glucose: >200, post prandial glucose: NOT CHECKING Discussed meal planning options and Plate method for healthy eating PATIENT REPORTS HE HAS CUT OUT FAST FOOD, HE IS DRINKING MORE WATER, EATING LESS/HEALTHIER CHOICES (I/E SALADS) Avoid sugary drinks and desserts Incorporate balanced protein, non starchy veggies, 1 serving of carbohydrate with each meal Increase water intake Increase physical activity as able Current exercise: N/A; ENCOURAGED Recommended switch to ozempic, MAY ADD SGLT2 AT F/U AS WELL Assessed patient finances. Novo nordisk PATIENT ASSISTANCE   Subjective: [name] an 71 22o. year old male who is a primary patient of [name].  The CCM team was consulted for assistance with disease management and care coordination needs.    Engaged with patient face to face for follow up visit in response to provider referral for pharmacy case management and/or care coordination services.   Consent to Services:  The patient was given information about Chronic Care Management services, agreed to services, and gave verbal consent prior to initiation of services.  Please see initial visit note for detailed documentation.   Patient Care Team: [name] as PCP - General (Family Medicine) LocGlennie IsleP-C (Inactive) as Nurse Practitioner (Hematology and  Oncology) DahFranchot GalloD as Consulting Physician (Urology) [facility] Pharmacist (Family Medicine)  Objective:  Lab Results  Component Value Date   CREATININE 1.16 05/21/2022   CREATININE 1.38 (H) 11/05/2021   CREATININE 1.22 07/24/2021    Lab Results  Component Value Date   HGBA1C 7.6 (H) 05/21/2022   Last diabetic Eye exam:  Lab Results  Component Value Date/Time   HMDIABEYEEXA No Retinopathy 09/17/2020 12:00 AM    Last diabetic Foot exam: No results found for: "HMDIABFOOTEX"      Component Value Date/Time   CHOL 99 (L) 05/21/2022 0806   TRIG 93 05/21/2022 0806   HDL 35 (L) 05/21/2022 0806   CHOLHDL 2.8 05/21/2022 0806   CHOLHDL 4.1 09/20/2020 0921   VLDL 15 09/20/2020 0921   LDLCALC 46 05/21/2022 0806       Latest Ref Rng & Units 05/21/2022    8:06 AM 11/05/2021    8:01 AM 07/24/2021    8:19 AM  Hepatic Function  Total Protein 6.0 - 8.5 g/dL 7.1  7.2  7.4   Albumin 3.8 - 4.8 g/dL 4.2  4.3  4.6   AST 0 - 40 IU/L 34  16  25   ALT 0 - 44 IU/L 29  20  25    Alk Phosphatase 44 - 121 IU/L 79  126  74   Total Bilirubin 0.0 - 1.2 mg/dL 1.0  0.5  0.7     Lab Results  Component Value Date/Time   TSH 2.389 10/19/2019 12:40 PM   TSH 4.010 09/08/2019 10:54 AM   TSH 4.370 05/03/2018 11:21 AM       Latest Ref Rng & Units 05/21/2022    8:06 AM 04/16/2022    1:22 PM 02/19/2022  12:54 PM  CBC  WBC 3.4 - 10.8 x10E3/uL 6.0  7.2  8.4   Hemoglobin 13.0 - 17.7 g/dL 16.7  17.0  16.7   Hematocrit 37.5 - 51.0 % 49.5  50.8  50.9   Platelets 150 - 450 x10E3/uL 220  218  268     Lab Results  Component Value Date/Time   VD25OH 36.36 09/20/2020 09:21 AM   VD25OH 31.92 08/02/2020 09:30 AM    Clinical ASCVD: No  The ASCVD Risk score (Arnett DK, et al., 2019) failed to calculate for the following reasons:   The valid total cholesterol range is 130 to 320 mg/dL    Other: (CHADS2VASc if Afib, PHQ9 if depression, MMRC or CAT for COPD, ACT, DEXA)  Social  History   Tobacco Use  Smoking Status Never  Smokeless Tobacco Never   BP Readings from Last 3 Encounters:  05/21/22 119/69  04/16/22 136/77  03/13/22 140/78   Pulse Readings from Last 3 Encounters:  05/21/22 62  04/16/22 70  03/13/22 61   Wt Readings from Last 3 Encounters:  05/21/22 252 lb (114.3 kg)  03/13/22 249 lb (112.9 kg)  02/06/22 259 lb (117.5 kg)    Assessment: Review of patient past medical history, allergies, medications, health status, including review of consultants reports, laboratory and other test data, was performed as part of comprehensive evaluation and provision of chronic care management services.   SDOH:  (Social Determinants of Health) assessments and interventions performed:    CCM Care Plan  Allergies  Allergen Reactions   Sulfa Antibiotics     rash   Ramipril Cough    Medications Reviewed Today     Reviewed by Dettinger, Fransisca Kaufmann, MD (Physician) on 05/21/22 at [place] List Status: <None>   Medication Order Taking? Sig Documenting Provider Last Dose Status Informant  acetaminophen (TYLENOL) 325 MG tablet [id number] Yes Take 2 tablets (650 mg total) by mouth every 6 (six) hours as needed for moderate pain or headache. Jaynee Eagles, PA-C Taking Active   amoxicillin (AMOXIL) 500 MG capsule [id number]  Take 500 mg by mouth 3 (three) times daily. [provider]  Active   aspirin EC 81 MG tablet [id number] Yes Take 81 mg by mouth every evening. [provider] Taking Active Self  ferrous sulfate 325 (65 FE) MG tablet [id number] Yes Take 325 mg by mouth daily with breakfast. [provider] Taking Active   gabapentin (NEURONTIN) 100 MG capsule [id number] Yes Take 2 capsules (200 mg total) by mouth at bedtime. Dettinger, Fransisca Kaufmann, MD Taking Active   glucose blood test strip [id number] Yes Use to check blood glucose once daily at varying times.  Dispense Verio One Touch Test strips Eckard, Tammy, RPH-CPP Taking Active Self   hydrOXYzine (ATARAX/VISTARIL) 25 MG tablet [id number] Yes Take 25 mg by mouth as needed. Cuts in half as needed [provider] Taking Active   irbesartan (AVAPRO) 150 MG tablet [id number] Yes Take 1 tablet (150 mg total) by mouth daily. Dettinger, Fransisca Kaufmann, MD Taking Active   metFORMIN (GLUCOPHAGE) 1000 MG tablet [id number] Yes Take 1 tablet (1,000 mg total) by mouth 2 (two) times daily with a meal. Dettinger, Fransisca Kaufmann, MD Taking Active   [facility] LANCETS 08X [place] [id number] Yes Use to check blood glucose once daily at varying times Eckard, Tammy, RPH-CPP Taking Active Self  Probiotic Product (PROBIOTIC PO) [id number] Yes Take 1 capsule by mouth 3 (three) times a week.  [provider] Taking Active Self  rosuvastatin (CRESTOR) 5 MG tablet [id number] Yes Take 1 tablet (5 mg total) by mouth at bedtime. Dettinger, Fransisca Kaufmann, MD Taking Active   tamsulosin [facility]) 0.4 MG CAPS capsule [id number] Yes Take 1 capsule (0.4 mg total) by mouth daily after supper. Dettinger, Fransisca Kaufmann, MD Taking Active   vitamin B-12 (CYANOCOBALAMIN) 500 MCG tablet [id number] Yes Take 500 mcg by mouth daily. [provider] Taking Active             Patient Active Problem List   Diagnosis Date Noted   Polycythemia, secondary 04/12/2019   History of colonic polyps 01/23/2019   History of rectal bleeding 08/25/2018   OSA on CPAP 06/21/2017   Diabetes mellitus type 2 in obese ([place]) 10/22/2016   Elevated PSA 10/22/2016   Hypertension associated with diabetes ([place]) 02/03/2011    Immunization History  Administered Date(s) Administered   Fluad Quad(high Dose 65+) 10/10/2019, 08/20/2020, 10/14/2021   Influenza,inj,Quad PF,6+ Mos 09/25/2014, 10/22/2016, 09/22/2017, 09/23/2018   Influenza-Unspecified 08/20/2020   Moderna Sars-Covid-2 Vaccination 01/25/2020, 02/22/2020   Pneumococcal Conjugate-13 09/27/2020   Pneumococcal Polysaccharide-23 10/14/2021   Tdap 09/25/2014   Zoster Recombinat  (Shingrix) 04/20/2018    Conditions to be addressed/monitored: HLD and DMII  Care Plan : PHARMD MEDICATION MANAGEMENT  Updates made by Lavera Guise, [place] since 05/29/2022 12:00 AM     Problem: DISEASE PROGRESSION PREVENTION      Long-Range Goal: T2DM PHARMD GOAL   Recent Progress: Not on track  Priority: High  Note:   Current Barriers:  Unable to independently afford treatment regimen Unable to achieve control of T2DM  Suboptimal therapeutic regimen for T2DM   Pharmacist Clinical Goal(s):  patient will verbalize ability to afford treatment regimen achieve control of T2DM as evidenced by GOAL A1C<7%, IMPROVED GLYCEMIC CONTROL  through collaboration with PharmD and provider.   Interventions: 1:1 collaboration with Dettinger, Fransisca Kaufmann, MD regarding development and update of comprehensive plan of care as evidenced by provider attestation and co-signature Inter-disciplinary care team collaboration (see longitudinal plan of care) Comprehensive medication review performed; medication list updated in electronic medical record  Diabetes: Goal on Track (progressing): YES. Uncontrolled--A1C >14%-->8.4%-->7.6% (MUCH IMPROVED); GFR >60 Current treatment: TRULICITY 7.8EU SQ WEEKLY-->ozempic, METFORMIN 1G TWICE DAILY;  Switch to Ozempic 45m Sample given Denies personal and family history of Medullary thyroid cancer (MTC) Will apply for patient assitsance via novo nordisk PAP EXPERIENCING SOME GI SIDE EFFECTS Current glucose readings: fasting glucose: >200, post prandial glucose: NOT CHECKING Discussed meal planning options and Plate method for healthy eating PATIENT REPORTS HE HAS CUT OUT FAST FOOD, HE IS DRINKING MORE WATER, EATING LESS/HEALTHIER CHOICES (I/E SALADS) Avoid sugary drinks and desserts Incorporate balanced protein, non starchy veggies, 1 serving of carbohydrate with each meal Increase water intake Increase physical activity as able Current exercise: N/A;  ENCOURAGED Recommended switch to ozempic, MAY ADD SGLT2 AT F/U AS WELL Assessed patient finances. Novo nordisk PATIENT ASSISTANCE  Hyperlipidemia:  Goal on Track (progressing): YES. controlled; current treatment:ROSUVASTATIN Medications previously tried: N/A  Current dietary patterns: ENCOURAGED HEART HEALTHY, MEDITERRANEAN  Counseled on DIETARY CHANGES; CONTINUE CURRENT THERAPY Lipid Panel     Component Value Date/Time   CHOL 102 11/05/2021 0801   TRIG 171 (H) 11/05/2021 0801   HDL 31 (L) 11/05/2021 0801   CHOLHDL 3.3 11/05/2021 0801   CHOLHDL 4.1 09/20/2020 0921   VLDL 15 09/20/2020 0921   LDLCALC 42 11/05/2021 0801   LABVLDL 29 11/05/2021 0801   Patient Goals/Self-Care Activities patient will:  -  take medications as prescribed as evidenced by patient report and record review check glucose DAILY, document, and provide at future appointments collaborate with provider on medication access solutions target a minimum of 150 minutes of moderate intensity exercise weekly engage in dietary modifications by HEART HEALTHY, HEALTHY PLATE METHOD FOR DIABETES      Medication Assistance: Application for ozempic  medication assistance program. in process.  Anticipated assistance start date tbd.  See plan of care for additional detail.  Patient's preferred pharmacy is:  [place] [place] [address] [place] [address] Phone: [phone] Fax: [phone]   Plan: Telephone follow up appointment with care management team member scheduled for:  1 month    Regina Eck, PharmD, BCPS Clinical Pharmacist, [place]  II Phone [phone]

## 2022-06-05 ENCOUNTER — Telehealth: Payer: Self-pay

## 2022-06-07 ENCOUNTER — Other Ambulatory Visit: Payer: Self-pay | Admitting: Family Medicine

## 2022-06-10 NOTE — Progress Notes (Unsigned)
[name], [name]   CLINIC:  Medical Oncology/Hematology  PCP:  Dettinger, Fransisca Kaufmann, MD [place] [id number] [phone]   REASON FOR VISIT:  Follow-up for secondary polycythemia   PRIOR THERAPY: None   CURRENT THERAPY: Intermittent phlebotomies, last on 04/16/2022   INTERVAL HISTORY:  [name] 71 y.o. male returns for routine follow-up of secondary polycythemia.  He was last seen by Tarri Abernethy PA-C on 12/25/2021.    At today's visit, he reports feeling fairly well.  No recent hospitalizations, surgeries, or changes in baseline health status.  He reports that after the skips phlebotomy in January 2023, he had increased symptoms of thirst and excessive heat.  He reports that he continues to feel hot and increased thirst and does not sleep very well.  Usually, this symptom will improve after phlebotomy.  No erythromelalgia, aquagenic pruritus, Raynaud's, or vasomotor symptoms. No fever, chills, or unintentional weight loss. He remains compliant with CPAP.    He has been tolerating his phlebotomies well.   He is taking oral ferrous sulfate once daily.  He has 75% energy and 50% appetite. He endorses that he has been working on some intentional weight loss via diet and exercise.     REVIEW OF SYSTEMS:    Review of Systems  Constitutional:  Positive for fatigue (mild, stable, energy 75%). Negative for appetite change, chills, diaphoresis, fever and unexpected weight change.  HENT:   Negative for lump/mass and nosebleeds.   Eyes:  Negative for eye problems.  Respiratory:  Negative for cough, hemoptysis and shortness of breath.   Cardiovascular:  Negative for chest pain, leg swelling and palpitations.  Gastrointestinal:  Negative for abdominal pain, blood in stool, constipation, diarrhea, nausea and vomiting.  Endocrine: Positive for hot flashes (feels hot at night).  Genitourinary:  Negative for hematuria.    Skin: Negative.   Neurological:  Negative for dizziness, headaches and light-headedness.  Hematological:  Does not bruise/bleed easily.     PAST MEDICAL/SURGICAL HISTORY:  Past Medical History:  Diagnosis Date   Diabetes mellitus without complication ([place])    GERD (gastroesophageal reflux disease)    Hx of [place] spotted fever 1-1-12011   Hypertension    Sleep apnea    wears cpap    Past Surgical History:  Procedure Laterality Date   COLONOSCOPY  09/2012   Dr. Britta Mccreedy. Diverticulosis, descending adenomatous colon polyp, hyperplastic rectal polyp. ?mass in anal canal evaluated by surgery and consistent with internal hemorrhoid   COLONOSCOPY WITH PROPOFOL N/A 02/07/2019   Procedure: COLONOSCOPY WITH PROPOFOL;  Surgeon: Danie Binder, MD;  Location: [facility];  Service: Endoscopy;  Laterality: N/A;  9:30am   KNEE SURGERY Left 10/2014   KNEE SURGERY Right 09/2015   POLYPECTOMY  02/07/2019   Procedure: POLYPECTOMY;  Surgeon: Danie Binder, MD;  Location: [facility];  Service: Endoscopy;;     SOCIAL HISTORY:  Social History   Socioeconomic History   Marital status: Single    Spouse name: Not on file   Number of children: 0   Years of education: 12   Highest education level: 12th grade  Occupational History   Occupation: Retired    Comment: Pensions consultant and gam[employer]  Tobacco Use   Smoking status: Never   Smokeless tobacco: Never  Vaping Use   Vaping Use: Never used  Substance and Sexual Activity   Alcohol use: No   Drug use: No  Sexual activity: Not Currently  Other Topics Concern   Not on file  Social History Narrative   WORKED AT [employer] > 10 YRS. NOT MARRIED. FUR BABIES: 3 DOGS AND 5 CATS. SPENDS FREE TIME: [place], [place], [place].   Social Determinants of Health   Financial Resource Strain: Low Risk  (09/15/2021)   Overall Financial Resource Strain (CARDIA)    Difficulty of Paying Living Expenses: Not hard at all  Food Insecurity:  No Food Insecurity (09/15/2021)   Hunger Vital Sign    Worried About Running Out of Food in the Last Year: Never true    Ran Out of Food in the Last Year: Never true  Transportation Needs: No Transportation Needs (09/15/2021)   PRAPARE - Hydrologist (Medical): No    Lack of Transportation (Non-Medical): No  Physical Activity: Sufficiently Active (09/15/2021)   Exercise Vital Sign    Days of Exercise per Week: 7 days    Minutes of Exercise per Session: 30 min  Stress: No Stress Concern Present (09/15/2021)   [place]    Feeling of Stress : Not at all  Social Connections: Moderately Integrated (09/15/2021)   Social Connection and Isolation Panel [NHANES]    Frequency of Communication with Friends and Family: More than three times a week    Frequency of Social Gatherings with Friends and Family: More than three times a week    Attends Religious Services: More than 4 times per year    Active Member of [employer] or Organizations: Yes    Attends Music therapist: More than 4 times per year    Marital Status: Never married  Intimate Partner Violence: Not At Risk (09/15/2021)   Humiliation, Afraid, Rape, and Kick questionnaire    Fear of Current or Ex-Partner: No    Emotionally Abused: No    Physically Abused: No    Sexually Abused: No    FAMILY HISTORY:  Family History  Problem Relation Age of Onset   Heart disease Mother    Congestive Heart Failure Mother    Diabetes Father    Aneurysm Sister    Diabetes Paternal Uncle    Diabetes Paternal Grandmother    Colon cancer Neg Hx    Colon polyps Neg Hx     CURRENT MEDICATIONS:  Outpatient Encounter Medications as of 06/11/2022  Medication Sig   acetaminophen (TYLENOL) 325 MG tablet Take 2 tablets (650 mg total) by mouth every 6 (six) hours as needed for moderate pain or headache.   amoxicillin (AMOXIL) 500 MG capsule Take 500 mg by  mouth 3 (three) times daily.   aspirin EC 81 MG tablet Take 81 mg by mouth every evening.   ferrous sulfate 325 (65 FE) MG tablet Take 325 mg by mouth daily with breakfast.   gabapentin (NEURONTIN) 100 MG capsule Take 2 capsules (200 mg total) by mouth at bedtime.   glucose blood test strip 1 each by Other route 2 (two) times daily. Use to check blood glucose once daily at varying times.  Dispense Verio One Touch Test strips   hydrOXYzine (ATARAX/VISTARIL) 25 MG tablet Take 25 mg by mouth as needed. Cuts in half as needed   irbesartan (AVAPRO) 150 MG tablet Take 1 tablet (150 mg total) by mouth daily.   metFORMIN (GLUCOPHAGE) 1000 MG tablet Take 1 tablet (1,000 mg total) by mouth 2 (two) times daily with a meal.   ONETOUCH  DELICA LANCETS 55D MISC Use to check blood glucose once daily at varying times   Probiotic Product (PROBIOTIC PO) Take 1 capsule by mouth 3 (three) times a week.    rosuvastatin (CRESTOR) 5 MG tablet Take 1 tablet (5 mg total) by mouth at bedtime.   Semaglutide, 1 MG/DOSE, 4 MG/3ML SOPN Inject 1 mg as directed once a week.   tamsulosin (FLOMAX) 0.4 MG CAPS capsule Take 1 capsule (0.4 mg total) by mouth daily after supper.   traZODone (DESYREL) 50 MG tablet Take 0.5-1 tablets (25-50 mg total) by mouth at bedtime as needed for sleep (For insomnia as needed).   vitamin B-12 (CYANOCOBALAMIN) 500 MCG tablet Take 500 mcg by mouth daily.   No facility-administered encounter medications on file as of 06/11/2022.    ALLERGIES:  Allergies  Allergen Reactions   Sulfa Antibiotics     rash   Ramipril Cough     PHYSICAL EXAM:  ECOG PERFORMANCE STATUS: 1 - Symptomatic but completely ambulatory    There were no vitals filed for this visit. There were no vitals filed for this visit. Physical Exam Constitutional:      Appearance: Normal appearance. He is obese.  HENT:     Head: Normocephalic and atraumatic.     Mouth/Throat:     Mouth: Mucous membranes are moist.  Eyes:      Extraocular Movements: Extraocular movements intact.     Pupils: Pupils are equal, round, and reactive to light.  Cardiovascular:     Rate and Rhythm: Normal rate and regular rhythm.     Pulses: Normal pulses.     Heart sounds: Normal heart sounds.  Pulmonary:     Effort: Pulmonary effort is normal.     Breath sounds: Normal breath sounds.  Abdominal:     General: Bowel sounds are normal.     Palpations: Abdomen is soft.     Tenderness: There is no abdominal tenderness.  Musculoskeletal:        General: No swelling.     Right lower leg: No edema.     Left lower leg: No edema.  Lymphadenopathy:     Cervical: No cervical adenopathy.  Skin:    General: Skin is warm and dry.  Neurological:     General: No focal deficit present.     Mental Status: He is alert and oriented to person, place, and time.  Psychiatric:        Mood and Affect: Mood normal.        Behavior: Behavior normal.      LABORATORY DATA:  I have reviewed the labs as listed.  CBC    Component Value Date/Time   WBC 6.0 05/21/2022 0806   WBC 7.2 04/16/2022 1322   RBC 5.37 05/21/2022 0806   RBC 5.69 04/16/2022 1322   HGB 16.7 05/21/2022 0806   HCT 49.5 05/21/2022 0806   PLT 220 05/21/2022 0806   MCV 92 05/21/2022 0806   MCH 31.1 05/21/2022 0806   MCH 29.9 04/16/2022 1322   MCHC 33.7 05/21/2022 0806   MCHC 33.5 04/16/2022 1322   RDW 13.2 05/21/2022 0806   LYMPHSABS 1.3 05/21/2022 0806   MONOABS 0.6 04/16/2022 1322   EOSABS 0.2 05/21/2022 0806   BASOSABS 0.1 05/21/2022 0806      Latest Ref Rng & Units 05/21/2022    8:06 AM 11/05/2021    8:01 AM 07/24/2021    8:19 AM  CMP  Glucose 70 - 99 mg/dL 167  502  136  BUN 8 - 27 mg/dL 19  32  27   Creatinine 0.76 - 1.27 mg/dL 1.16  1.38  1.22   Sodium 134 - 144 mmol/L 138  132  138   Potassium 3.5 - 5.2 mmol/L 4.2  4.5  5.0   Chloride 96 - 106 mmol/L 100  92  102   CO2 20 - 29 mmol/L '21  24  23   '$ Calcium 8.6 - 10.2 mg/dL 9.4  9.9  9.6   Total Protein 6.0  - 8.5 g/dL 7.1  7.2  7.4   Total Bilirubin 0.0 - 1.2 mg/dL 1.0  0.5  0.7   Alkaline Phos 44 - 121 IU/L 79  126  74   AST 0 - 40 IU/L 34  16  25   ALT 0 - 44 IU/L '29  20  25     '$ DIAGNOSTIC IMAGING:  I have independently reviewed the relevant imaging and discussed with the patient.  ASSESSMENT & PLAN: 1.  Secondary polycythemia (JAK2 negative): - Patient tested NEGATIVE for JAK2 mutations, CALR, and MPL. - Erythropoietin level was normal at 10 - Other work-up was normal (BCR/ABL, HIV, SPEP, ANA, CRP, RF) - He has sleep apnea and uses CPAP machine.  Lifelong non-smoker. - He started phlebotomies in February 2020, averaging phlebotomy every 6 to 8 weeks. - Most recent phlebotomy on 04/16/2022 - Reports excessive heat, night sweats, and thirst when his Hgb/HCT is elevated.    - Denies aquagenic pruritus or other vasomotor symptoms.     - Patient reports that he feels better when HCT < 40 - Differential diagnosis favors secondary polycythemia in the setting of OSA, but it is also possible that he may have triple negative primary polycythemia - Labs today (06/11/2022): Hgb 16.5/HCT 47.8 - PLAN: Proceed with phlebotomy today.  Continue CBC and phlebotomy every 8 weeks if HCT >40.  Office visit in 24 weeks.   2.  Sleep apnea - Patient reports compliance with CPAP nightly - PLAN: Continue CPAP   3.  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 - Significant iron deficiency noted in January 2023 with ferritin 7, iron saturation 7%, and TIBC 464 - He has been taking ferrous sulfate 325 mg daily since January 2023 - Most recent iron panel (06/11/2022): Ferritin slightly improved at 16, with improved saturation 21% and TIBC 413 - PLAN: Continue with phlebotomy for polycythemia as above, will periodically check iron. - Mnagement of iron deficiency in the setting of secondary polycythemia is poorly defined, but  it is reasonable to consider combination of phlebotomy in addition to oral iron therapy in order to improve iron stores while safely maintaining an asymptomatic hematocrit level.  Recommended to patient that he start taking ferrous sulfate 325 mg OTC daily.   - We will recheck iron panel at his follow-up visit in 24 weeks.   4.   Health Maintenance & Other: - Patient had a colonoscopy on 02/07/2019 that showed a 5 mm polyp in the ascending colon that showed tubular adenoma.  Diverticulosis in the rectosigmoid colon and the ascending colon.  Torturous colon.  External and internal hemorrhoids. - Elevated PSA:  Patient has a PSA of 9.2, followed by [facility].  Prostate biopsy on 03/30/2017 with 12 cores submitted that were benign. - Positive hep B serology:  Patient has positive hep B surface antibody and positive hep B core antibody. Surface antigen and IgM are  negative.  Hep C and HIV are negative. Patient is immune based on prior exposure.   PLAN SUMMARY & DISPOSITION:   CBC with as needed phlebotomy every 8 weeks CBC and iron panel with RTC in 24 weeks  All questions were answered. The patient knows to call the clinic with any problems, questions or concerns.  Medical decision making: Low    Time spent on visit: I spent 15 minutes counseling the patient face to face. The total time spent in the appointment was 25 minutes and more than 50% was on counseling.   Harriett Rush, PA-C  06/11/2022 2:00 PM

## 2022-06-11 ENCOUNTER — Inpatient Hospital Stay (HOSPITAL_COMMUNITY): Payer: Medicare Other | Admitting: Physician Assistant

## 2022-06-11 ENCOUNTER — Inpatient Hospital Stay (HOSPITAL_COMMUNITY): Payer: Medicare Other

## 2022-06-11 ENCOUNTER — Inpatient Hospital Stay (HOSPITAL_COMMUNITY): Payer: Medicare Other | Attending: Hematology

## 2022-06-11 VITALS — BP 146/74 | HR 64 | Temp 97.6°F | Resp 16 | Ht 70.0 in | Wt 249.6 lb

## 2022-06-11 DIAGNOSIS — Z8249 Family history of ischemic heart disease and other diseases of the circulatory system: Secondary | ICD-10-CM | POA: Diagnosis not present

## 2022-06-11 DIAGNOSIS — Z833 Family history of diabetes mellitus: Secondary | ICD-10-CM | POA: Diagnosis not present

## 2022-06-11 DIAGNOSIS — R5383 Other fatigue: Secondary | ICD-10-CM | POA: Insufficient documentation

## 2022-06-11 DIAGNOSIS — Z148 Genetic carrier of other disease: Secondary | ICD-10-CM

## 2022-06-11 DIAGNOSIS — Z882 Allergy status to sulfonamides status: Secondary | ICD-10-CM | POA: Insufficient documentation

## 2022-06-11 DIAGNOSIS — R232 Flushing: Secondary | ICD-10-CM | POA: Diagnosis not present

## 2022-06-11 DIAGNOSIS — D751 Secondary polycythemia: Secondary | ICD-10-CM | POA: Diagnosis not present

## 2022-06-11 DIAGNOSIS — E119 Type 2 diabetes mellitus without complications: Secondary | ICD-10-CM | POA: Diagnosis not present

## 2022-06-11 DIAGNOSIS — I10 Essential (primary) hypertension: Secondary | ICD-10-CM | POA: Insufficient documentation

## 2022-06-11 DIAGNOSIS — Z79899 Other long term (current) drug therapy: Secondary | ICD-10-CM | POA: Diagnosis not present

## 2022-06-11 DIAGNOSIS — G4730 Sleep apnea, unspecified: Secondary | ICD-10-CM | POA: Insufficient documentation

## 2022-06-11 DIAGNOSIS — Z8719 Personal history of other diseases of the digestive system: Secondary | ICD-10-CM | POA: Diagnosis not present

## 2022-06-11 LAB — CBC WITH DIFFERENTIAL/PLATELET
Abs Immature Granulocytes: 0.02 10*3/uL (ref 0.00–0.07)
Basophils Absolute: 0.1 10*3/uL (ref 0.0–0.1)
Basophils Relative: 1 %
Eosinophils Absolute: 0.2 10*3/uL (ref 0.0–0.5)
Eosinophils Relative: 3 %
HCT: 47.8 % (ref 39.0–52.0)
Hemoglobin: 16.5 g/dL (ref 13.0–17.0)
Immature Granulocytes: 0 %
Lymphocytes Relative: 24 %
Lymphs Abs: 1.5 10*3/uL (ref 0.7–4.0)
MCH: 31.9 pg (ref 26.0–34.0)
MCHC: 34.5 g/dL (ref 30.0–36.0)
MCV: 92.5 fL (ref 80.0–100.0)
Monocytes Absolute: 0.5 10*3/uL (ref 0.1–1.0)
Monocytes Relative: 8 %
Neutro Abs: 3.9 10*3/uL (ref 1.7–7.7)
Neutrophils Relative %: 64 %
Platelets: 231 10*3/uL (ref 150–400)
RBC: 5.17 MIL/uL (ref 4.22–5.81)
RDW: 13.1 % (ref 11.5–15.5)
WBC: 6.1 10*3/uL (ref 4.0–10.5)
nRBC: 0 % (ref 0.0–0.2)

## 2022-06-11 LAB — IRON AND TIBC
Iron: 88 ug/dL (ref 45–182)
Saturation Ratios: 21 % (ref 17.9–39.5)
TIBC: 413 ug/dL (ref 250–450)
UIBC: 325 ug/dL

## 2022-06-11 LAB — FERRITIN: Ferritin: 16 ng/mL — ABNORMAL LOW (ref 24–336)

## 2022-06-11 NOTE — Patient Instructions (Signed)
[facility] at [facility] Discharge Instructions  You were seen today by Hawken Bielby PA-C for your elevated blood counts.  We will schedule you for labs and possible phlebotomy every 8 weeks.    You should CONTINUE taking iron pills daily due to your low iron.  We will check your iron levels again at your next office visit.  LABS: CBC and possible phlebotomy every 8 weeks   MEDICATIONS: Daily iron tablet (ferrous sulfate)  FOLLOW-UP APPOINTMENT: Office visit in about 6 months   - - - - - - - - - - - - - - - - - -    Thank you for choosing [facility] at [facility] to provide your oncology and hematology care.  To afford each patient quality time with our provider, please arrive at least 15 minutes before your scheduled appointment time.   If you have a lab appointment with the Cancer Center please come in thru the Main Entrance and check in at the main information desk.  You need to re-schedule your appointment should you arrive 10 or more minutes late.  We strive to give you quality time with our providers, and arriving late affects you and other patients whose appointments are after yours.  Also, if you no show three or more times for appointments you may be dismissed from the clinic at the providers discretion.     Again, thank you for choosing [facility].  Our hope is that these requests will decrease the amount of time that you wait before being seen by our physicians.       _____________________________________________________________  Should you have questions after your visit to  Cancer Center, please contact our office at [phone] and follow the prompts.  Our office hours are 8:00 a.m. and 4:30 p.m. Monday - Friday.  Please note that voicemails left after 4:00 p.m. may not be returned until the following business day.  We are closed weekends and major holidays.  You do have access to a nurse 24-7,  just call the main number to the clinic [phone] and do not press any options, hold on the line and a nurse will answer the phone.    For prescription refill requests, have your pharmacy contact our office and allow 72 hours.    Due to Covid, you will need to wear a mask upon entering the hospital. If you do not have a mask, a mask will be given to you at the Main Entrance upon arrival. For doctor visits, patients may have 1 support person age 18 or older with them. For treatment visits, patients can not have anyone with them due to social distancing guidelines and our immunocompromised population.   

## 2022-06-11 NOTE — Progress Notes (Signed)
Patient presents today for phlebotomy per MD orders. Phlebotomy procedure started at 1406 and ended at 1412. 500 cc removed. Patient tolerated procedure well. Procedure tolerated well and without incident. Discharged ambulatory in stable condition.

## 2022-06-11 NOTE — Patient Instructions (Signed)
[facility]  Discharge Instructions: Thank you for choosing [facility] to provide your oncology and hematology care.  If you have a lab appointment with the [facility], please come in thru the Main Entrance and check in at the main information desk.  Wear comfortable clothing and clothing appropriate for easy access to any Portacath or PICC line.   We strive to give you quality time with your provider. You may need to reschedule your appointment if you arrive late (15 or more minutes).  Arriving late affects you and other patients whose appointments are after yours.  Also, if you miss three or more appointments without notifying the office, you may be dismissed from the clinic at the provider's discretion.      For prescription refill requests, have your pharmacy contact our office and allow 72 hours for refills to be completed.    Today you received the following chemotherapy and/or immunotherapy agents Phelbotomy      To help prevent nausea and vomiting after your treatment, we encourage you to take your nausea medication as directed.  BELOW ARE SYMPTOMS THAT SHOULD BE REPORTED IMMEDIATELY: *FEVER GREATER THAN 100.4 F (38 C) OR HIGHER *CHILLS OR SWEATING *NAUSEA AND VOMITING THAT IS NOT CONTROLLED WITH YOUR NAUSEA MEDICATION *UNUSUAL SHORTNESS OF BREATH *UNUSUAL BRUISING OR BLEEDING *URINARY PROBLEMS (pain or burning when urinating, or frequent urination) *BOWEL PROBLEMS (unusual diarrhea, constipation, pain near the anus) TENDERNESS IN MOUTH AND THROAT WITH OR WITHOUT PRESENCE OF ULCERS (sore throat, sores in mouth, or a toothache) UNUSUAL RASH, SWELLING OR PAIN  UNUSUAL VAGINAL DISCHARGE OR ITCHING   Items with * indicate a potential emergency and should be followed up as soon as possible or go to the Emergency Department if any problems should occur.  Please show the CHEMOTHERAPY ALERT CARD or IMMUNOTHERAPY ALERT CARD at check-in to the Emergency  Department and triage nurse.  Should you have questions after your visit or need to cancel or reschedule your appointment, please contact [facility] [phone]  and follow the prompts.  Office hours are 8:00 a.m. to 4:30 p.m. Monday - Friday. Please note that voicemails left after 4:00 p.m. may not be returned until the following business day.  We are closed weekends and major holidays. You have access to a nurse at all times for urgent questions. Please call the main number to the clinic [phone] and follow the prompts.  For any non-urgent questions, you may also contact your provider using MyChart. We now offer e-Visits for anyone 58 and older to request care online for non-urgent symptoms. For details visit [email or web address].   Also download the MyChart app! Go to the app store, search "MyChart", open the app, select [facility], and log in with your MyChart username and password.  Masks are optional in the cancer centers. If you would like for your care team to wear a mask while they are taking care of you, please let them know. For doctor visits, patients may have with them one support person who is at least 70 years old. At this time, visitors are not allowed in the infusion area.

## 2022-06-12 DIAGNOSIS — Z7985 Long-term (current) use of injectable non-insulin antidiabetic drugs: Secondary | ICD-10-CM | POA: Diagnosis not present

## 2022-06-12 DIAGNOSIS — E785 Hyperlipidemia, unspecified: Secondary | ICD-10-CM | POA: Diagnosis not present

## 2022-06-12 DIAGNOSIS — E1169 Type 2 diabetes mellitus with other specified complication: Secondary | ICD-10-CM | POA: Diagnosis not present

## 2022-06-25 ENCOUNTER — Ambulatory Visit (INDEPENDENT_AMBULATORY_CARE_PROVIDER_SITE_OTHER): Payer: Medicare Other | Admitting: Pharmacist

## 2022-06-25 DIAGNOSIS — E1169 Type 2 diabetes mellitus with other specified complication: Secondary | ICD-10-CM

## 2022-06-25 DIAGNOSIS — E1159 Type 2 diabetes mellitus with other circulatory complications: Secondary | ICD-10-CM

## 2022-06-25 NOTE — Patient Instructions (Addendum)
Visit Information  Following are the goals we discussed today:  Current Barriers:  Unable to independently afford treatment regimen Unable to achieve control of T2DM  Suboptimal therapeutic regimen for T2DM   Pharmacist Clinical Goal(s):  patient will verbalize ability to afford treatment regimen achieve control of T2DM as evidenced by GOAL A1C<7%, IMPROVED GLYCEMIC CONTROL through collaboration with PharmD and provider.   Interventions: 1:1 collaboration with Dettinger, Fransisca Kaufmann, MD regarding development and update of comprehensive plan of care as evidenced by provider attestation and co-signature Inter-disciplinary care team collaboration (see longitudinal plan of care) Comprehensive medication review performed; medication list updated in electronic medical record  Diabetes: Goal on Track (progressing): YES. Uncontrolled--A1C >14%-->8.4%-->7.6% (MUCH IMPROVED); GFR >60 Current treatment: ozempic, METFORMIN 1G TWICE DAILY;  INCREASE to Ozempic '1mg'$  Patient assistance supply arrived; previously on trulicity, but changed due to cost/patient assistance Denies personal and family history of Medullary thyroid cancer (MTC) Current glucose readings: fasting glucose: <150 (improved), post prandial glucose: NOT CHECKING Discussed meal planning options and Plate method for healthy eating PATIENT REPORTS HE HAS CUT OUT FAST FOOD, HE IS DRINKING MORE WATER, EATING LESS/HEALTHIER CHOICES (I/E SALADS) Avoid sugary drinks and desserts Incorporate balanced protein, non starchy veggies, 1 serving of carbohydrate with each meal Increase water intake Increase physical activity as able Current exercise: N/A; ENCOURAGED Recommended switch to ozempic, MAY ADD SGLT2 AT F/U AS WELL Assessed patient finances. Novo nordisk PATIENT ASSISTANCE  Hyperlipidemia:  Goal on Track (progressing): YES. controlled; current treatment:ROSUVASTATIN Medications previously tried: N/A  Current dietary patterns:  ENCOURAGED HEART HEALTHY, MEDITERRANEAN  Counseled on DIETARY CHANGES; CONTINUE CURRENT THERAPY Lipid Panel     Component Value Date/Time   CHOL 102 11/05/2021 0801   TRIG 171 (H) 11/05/2021 0801   HDL 31 (L) 11/05/2021 0801   CHOLHDL 3.3 11/05/2021 0801   CHOLHDL 4.1 09/20/2020 0921   VLDL 15 09/20/2020 0921   LDLCALC 42 11/05/2021 0801   LABVLDL 29 11/05/2021 0801    Patient Goals/Self-Care Activities patient will:  - take medications as prescribed as evidenced by patient report and record review check glucose DAILY, document, and provide at future appointments collaborate with provider on medication access solutions target a minimum of 150 minutes of moderate intensity exercise weekly engage in dietary modifications by HEART HEALTHY, HEALTHY PLATE METHOD FOR DIABETES   Plan: Telephone follow up appointment with care management team member scheduled for:  3 months  Signature Regina Eck, PharmD, BCPS Clinical Pharmacist, [place]  II Phone [phone]   Please call the care guide team at [phone] if you need to cancel or reschedule your appointment.   The patient verbalized understanding of instructions, educational materials, and care plan provided today and DECLINED offer to receive copy of patient instructions, educational materials, and care plan.

## 2022-06-25 NOTE — Progress Notes (Signed)
Chronic Care Management Pharmacy Note  06/25/2022 Name:  [name] MRN:  [id number] DOB:  04-14-1951  Summary:  Diabetes: Goal on Track (progressing): YES. Uncontrolled--A1C >14%-->8.4%-->7.6% (MUCH IMPROVED); GFR >60 Current treatment: ozempic, METFORMIN 1G TWICE DAILY;  INCREASE to Ozempic 56m Patient assistance supply arrived; previously on trulicity, but changed due to cost/patient assistance Denies personal and family history of Medullary thyroid cancer (MTC) Current glucose readings: fasting glucose: <150 (improved), post prandial glucose: NOT CHECKING Discussed meal planning options and Plate method for healthy eating PATIENT REPORTS HE HAS CUT OUT FAST FOOD, HE IS DRINKING MORE WATER, EATING LESS/HEALTHIER CHOICES (I/E SALADS) Avoid sugary drinks and desserts Incorporate balanced protein, non starchy veggies, 1 serving of carbohydrate with each meal Increase water intake Increase physical activity as able Current exercise: N/A; ENCOURAGED Recommended switch to ozempic, MAY ADD SGLT2 AT F/U AS WELL Assessed patient finances. Novo nordisk PATIENT ASSISTANCE  Hyperlipidemia:  Goal on Track (progressing): YES. controlled; current treatment:ROSUVASTATIN Medications previously tried: N/A  Current dietary patterns: ENCOURAGED HEART HEALTHY, MEDITERRANEAN  Counseled on DIETARY CHANGES; CONTINUE CURRENT THERAPY Lipid Panel     Component Value Date/Time   CHOL 102 11/05/2021 0801   TRIG 171 (H) 11/05/2021 0801   HDL 31 (L) 11/05/2021 0801   CHOLHDL 3.3 11/05/2021 0801   CHOLHDL 4.1 09/20/2020 0921   VLDL 15 09/20/2020 0921   LDLCALC 42 11/05/2021 0801   LABVLDL 29 11/05/2021 0801    Patient Goals/Self-Care Activities patient will:  - take medications as prescribed as evidenced by patient report and record review check glucose DAILY, document, and provide at future appointments collaborate with provider on medication access solutions target a minimum of 150  minutes of moderate intensity exercise weekly engage in dietary modifications by HEART HEALTHY, HEALTHY PLATE METHOD FOR DIABETES   Subjective: HJamie Belgeris an 71y.o. year old male who is a primary patient of Dettinger, Timothy Kaufmann MD.  The CCM team was consulted for assistance with disease management and care coordination needs.    Engaged with patient by telephone for follow up visit in response to provider referral for pharmacy case management and/or care coordination services.   Consent to Services:  The patient was given information about Chronic Care Management services, agreed to services, and gave verbal consent prior to initiation of services.  Please see initial visit note for detailed documentation.   Patient Care Team: Dettinger, Timothy Kaufmann MD as PCP - General (Family Medicine) LGlennie Isle NP-C (Inactive) as Nurse Practitioner (Hematology and Oncology) DFranchot Gallo MD as Consulting Physician (Urology) [facility]Lavera Guise [facility] Pharmacist (Family Medicine)  Objective:  Lab Results  Component Value Date   CREATININE 1.16 05/21/2022   CREATININE 1.38 (H) 11/05/2021   CREATININE 1.22 07/24/2021    Lab Results  Component Value Date   HGBA1C 7.6 (H) 05/21/2022   Last diabetic Eye exam:  Lab Results  Component Value Date/Time   HMDIABEYEEXA No Retinopathy 09/17/2020 12:00 AM    Last diabetic Foot exam: No results found for: "HMDIABFOOTEX"      Component Value Date/Time   CHOL 99 (L) 05/21/2022 0806   TRIG 93 05/21/2022 0806   HDL 35 (L) 05/21/2022 0806   CHOLHDL 2.8 05/21/2022 0806   CHOLHDL 4.1 09/20/2020 0921   VLDL 15 09/20/2020 0921   LDLCALC 46 05/21/2022 0806       Latest Ref Rng & Units 05/21/2022    8:06 AM 11/05/2021    8:01 AM 07/24/2021  8:19 AM  Hepatic Function  Total Protein 6.0 - 8.5 g/dL 7.1  7.2  7.4   Albumin 3.8 - 4.8 g/dL 4.2  4.3  4.6   AST 0 - 40 IU/L 34  16  25   ALT 0 - 44 IU/L _0 Alk Phosphatase 44 - 121  IU/L 79  126  74   Total Bilirubin 0.0 - 1.2 mg/dL 1.0  0.5  0.7     Lab Results  Component Value Date/Time   TSH 2.389 10/19/2019 12:40 PM   TSH 4.010 09/08/2019 10:54 AM   TSH 4.370 05/03/2018 11:21 AM       Latest Ref Rng & Units 06/11/2022   11:59 AM 05/21/2022    8:06 AM 04/16/2022    1:22 PM  CBC  WBC 4.0 - 10.5 K/uL 6.1  6.0  7.2   Hemoglobin 13.0 - 17.0 g/dL 16.5  16.7  17.0   Hematocrit 39.0 - 52.0 % 47.8  49.5  50.8   Platelets 150 - 400 K/uL 231  220  218     Lab Results  Component Value Date/Time   VD25OH 36.36 09/20/2020 09:21 AM   VD25OH 31.92 08/02/2020 09:30 AM    Clinical ASCVD: No  The ASCVD Risk score (Arnett DK, et al., 2019) failed to calculate for the following reasons:   The valid total cholesterol range is 130 to 320 mg/dL    Other: (CHADS2VASc if Afib, PHQ9 if depression, MMRC or CAT for COPD, ACT, DEXA)  Social History   Tobacco Use  Smoking Status Never  Smokeless Tobacco Never   BP Readings from Last 3 Encounters:  06/11/22 122/82  06/11/22 (!) 146/74  05/21/22 119/69   Pulse Readings from Last 3 Encounters:  06/11/22 64  05/21/22 62  04/16/22 70   Wt Readings from Last 3 Encounters:  06/11/22 249 lb 9 oz (113.2 kg)  05/21/22 252 lb (114.3 kg)  03/13/22 249 lb (112.9 kg)    Assessment: Review of patient past medical history, allergies, medications, health status, including review of consultants reports, laboratory and other test data, was performed as part of comprehensive evaluation and provision of chronic care management services.   SDOH:  (Social Determinants of Health) assessments and interventions performed:    CCM Care Plan  Allergies  Allergen Reactions   Sulfa Antibiotics     rash   Ramipril Cough    Medications Reviewed Today     Reviewed by Lavera Guise, [facility] (Pharmacist) on 06/25/22 at [phone]  Med List Status: <None>   Medication Order Taking? Sig Documenting Provider Last Dose Status Informant   acetaminophen (TYLENOL) 325 MG tablet [id number] No Take 2 tablets (650 mg total) by mouth every 6 (six) hours as needed for moderate pain or headache. Jaynee Eagles, PA-C Taking Active   amoxicillin (AMOXIL) 500 MG capsule [id number] No Take 500 mg by mouth 3 (three) times daily. [provider] Taking Active   aspirin EC 81 MG tablet [id number] No Take 81 mg by mouth every evening. [provider] Taking Active Self  ferrous sulfate 325 (65 FE) MG tablet [id number] No Take 325 mg by mouth daily with breakfast. [provider] Taking Active   gabapentin (NEURONTIN) 100 MG capsule [id number] No Take 2 capsules (200 mg total) by mouth at bedtime. Dettinger, Fransisca Kaufmann, MD Taking Active   glucose blood test strip [id number] No 1 each by Other route 2 (two) times daily. Use to check  blood glucose once daily at varying times.  Dispense Verio One Touch Test strips Dettinger, Fransisca Kaufmann, MD Taking Active   hydrOXYzine (ATARAX/VISTARIL) 25 MG tablet [id number] No Take 25 mg by mouth as needed. Cuts in half as needed [provider] Taking Active   irbesartan (AVAPRO) 150 MG tablet [id number] No Take 1 tablet (150 mg total) by mouth daily. Dettinger, Fransisca Kaufmann, MD Taking Active   metFORMIN (GLUCOPHAGE) 1000 MG tablet [id number] No Take 1 tablet (1,000 mg total) by mouth 2 (two) times daily with a meal. Dettinger, Fransisca Kaufmann, MD Taking Active   [facility] LANCETS [address] [place] [id number] No Use to check blood glucose once daily at varying times Eckard, Tammy, RPH-CPP Taking Active Self  Probiotic Product (PROBIOTIC PO) [id number] No Take 1 capsule by mouth 3 (three) times a week.  [provider] Taking Active Self  rosuvastatin (CRESTOR) 5 MG tablet [id number] No Take 1 tablet (5 mg total) by mouth at bedtime. Dettinger, Fransisca Kaufmann, MD Taking Active   Semaglutide, 1 MG/DOSE, 4 MG/3ML SOPN [id number] No Inject 1 mg as directed once a week. Dettinger, Fransisca Kaufmann, MD Taking Active   tamsulosin  [facility]) 0.4 MG CAPS capsule [id number] No Take 1 capsule (0.4 mg total) by mouth daily after supper. Dettinger, Fransisca Kaufmann, MD Taking Active   traZODone (DESYREL) 50 MG tablet [id number] No Take 0.5-1 tablets (25-50 mg total) by mouth at bedtime as needed for sleep (For insomnia as needed). Dettinger, Fransisca Kaufmann, MD Taking Active   vitamin B-12 (CYANOCOBALAMIN) 500 MCG tablet [id number] No Take 500 mcg by mouth daily. [provider] Taking Active             Patient Active Problem List   Diagnosis Date Noted   Polycythemia, secondary 04/12/2019   History of colonic polyps 01/23/2019   History of rectal bleeding 08/25/2018   OSA on CPAP 06/21/2017   Diabetes mellitus type 2 in obese ([place]) 10/22/2016   Elevated PSA 10/22/2016   Hypertension associated with diabetes ([place]) 02/03/2011    Immunization History  Administered Date(s) Administered   Fluad Quad(high Dose 65+) 10/10/2019, 08/20/2020, 10/14/2021   Influenza,inj,Quad PF,6+ Mos 09/25/2014, 10/22/2016, 09/22/2017, 09/23/2018   Influenza-Unspecified 08/20/2020   Moderna Sars-Covid-2 Vaccination 01/25/2020, 02/22/2020   Pneumococcal Conjugate-13 09/27/2020   Pneumococcal Polysaccharide-23 10/14/2021   Tdap 09/25/2014   Zoster Recombinat (Shingrix) 04/20/2018    Conditions to be addressed/monitored: HLD and DMII  Care Plan : PHARMD MEDICATION MANAGEMENT  Updates made by Lavera Guise, [place] since 06/25/2022 12:00 AM     Problem: DISEASE PROGRESSION PREVENTION      Long-Range Goal: T2DM PHARMD GOAL   Recent Progress: Not on track  Priority: High  Note:   Current Barriers:  Unable to independently afford treatment regimen Unable to achieve control of T2DM  Suboptimal therapeutic regimen for T2DM   Pharmacist Clinical Goal(s):  patient will verbalize ability to afford treatment regimen achieve control of T2DM as evidenced by GOAL A1C<7%, IMPROVED GLYCEMIC CONTROL  through collaboration with PharmD and provider.    Interventions: 1:1 collaboration with Dettinger, Fransisca Kaufmann, MD regarding development and update of comprehensive plan of care as evidenced by provider attestation and co-signature Inter-disciplinary care team collaboration (see longitudinal plan of care) Comprehensive medication review performed; medication list updated in electronic medical record  Diabetes: Goal on Track (progressing): YES. Uncontrolled--A1C >14%-->8.4%-->7.6% (MUCH IMPROVED); GFR >60 Current treatment: ozempic, METFORMIN 1G TWICE DAILY;  INCREASE to Ozempic 42m Patient assistance supply arrived; previously on trulicity,  but changed due to cost/patient assistance Denies personal and family history of Medullary thyroid cancer (MTC) Current glucose readings: fasting glucose: <150 (improved), post prandial glucose: NOT CHECKING Discussed meal planning options and Plate method for healthy eating PATIENT REPORTS HE HAS CUT OUT FAST FOOD, HE IS DRINKING MORE WATER, EATING LESS/HEALTHIER CHOICES (I/E SALADS) Avoid sugary drinks and desserts Incorporate balanced protein, non starchy veggies, 1 serving of carbohydrate with each meal Increase water intake Increase physical activity as able Current exercise: N/A; ENCOURAGED Recommended switch to ozempic, MAY ADD SGLT2 AT F/U AS WELL Assessed patient finances. Novo nordisk PATIENT ASSISTANCE  Hyperlipidemia:  Goal on Track (progressing): YES. controlled; current treatment:ROSUVASTATIN Medications previously tried: N/A  Current dietary patterns: ENCOURAGED HEART HEALTHY, MEDITERRANEAN  Counseled on DIETARY CHANGES; CONTINUE CURRENT THERAPY Lipid Panel     Component Value Date/Time   CHOL 102 11/05/2021 0801   TRIG 171 (H) 11/05/2021 0801   HDL 31 (L) 11/05/2021 0801   CHOLHDL 3.3 11/05/2021 0801   CHOLHDL 4.1 09/20/2020 0921   VLDL 15 09/20/2020 0921   LDLCALC 42 11/05/2021 0801   LABVLDL 29 11/05/2021 0801   Patient Goals/Self-Care Activities patient will:  - take  medications as prescribed as evidenced by patient report and record review check glucose DAILY, document, and provide at future appointments collaborate with provider on medication access solutions target a minimum of 150 minutes of moderate intensity exercise weekly engage in dietary modifications by HEART HEALTHY, HEALTHY PLATE METHOD FOR DIABETES      Medication Assistance:  Ozempic obtained through novo nordisk patient assistance medication assistance program.  Enrollment ends 12/13/22  Patient's preferred pharmacy is:  [facility], [place] [address] [place] [address] Phone: [phone] Fax: [phone]  Plan: Telephone follow up appointment with care management team member scheduled for:  6 months   Regina Eck, PharmD, BCPS Clinical Pharmacist, [place]  II Phone [phone]

## 2022-07-13 DIAGNOSIS — E669 Obesity, unspecified: Secondary | ICD-10-CM | POA: Diagnosis not present

## 2022-07-13 DIAGNOSIS — I152 Hypertension secondary to endocrine disorders: Secondary | ICD-10-CM | POA: Diagnosis not present

## 2022-07-13 DIAGNOSIS — E1169 Type 2 diabetes mellitus with other specified complication: Secondary | ICD-10-CM | POA: Diagnosis not present

## 2022-07-13 DIAGNOSIS — E1159 Type 2 diabetes mellitus with other circulatory complications: Secondary | ICD-10-CM | POA: Diagnosis not present

## 2022-08-05 ENCOUNTER — Other Ambulatory Visit: Payer: Self-pay

## 2022-08-06 ENCOUNTER — Inpatient Hospital Stay: Payer: Medicare Other

## 2022-08-06 ENCOUNTER — Inpatient Hospital Stay: Payer: Medicare Other | Attending: Hematology

## 2022-08-06 VITALS — BP 130/73 | HR 62 | Temp 97.8°F | Resp 18

## 2022-08-06 DIAGNOSIS — D751 Secondary polycythemia: Secondary | ICD-10-CM | POA: Insufficient documentation

## 2022-08-06 LAB — CBC WITH DIFFERENTIAL/PLATELET
Abs Immature Granulocytes: 0.02 10*3/uL (ref 0.00–0.07)
Basophils Absolute: 0.1 10*3/uL (ref 0.0–0.1)
Basophils Relative: 1 %
Eosinophils Absolute: 0.4 10*3/uL (ref 0.0–0.5)
Eosinophils Relative: 5 %
HCT: 46.5 % (ref 39.0–52.0)
Hemoglobin: 15.9 g/dL (ref 13.0–17.0)
Immature Granulocytes: 0 %
Lymphocytes Relative: 18 %
Lymphs Abs: 1.4 10*3/uL (ref 0.7–4.0)
MCH: 31.5 pg (ref 26.0–34.0)
MCHC: 34.2 g/dL (ref 30.0–36.0)
MCV: 92.3 fL (ref 80.0–100.0)
Monocytes Absolute: 0.5 10*3/uL (ref 0.1–1.0)
Monocytes Relative: 7 %
Neutro Abs: 5.3 10*3/uL (ref 1.7–7.7)
Neutrophils Relative %: 69 %
Platelets: 205 10*3/uL (ref 150–400)
RBC: 5.04 MIL/uL (ref 4.22–5.81)
RDW: 13.2 % (ref 11.5–15.5)
WBC: 7.6 10*3/uL (ref 4.0–10.5)
nRBC: 0 % (ref 0.0–0.2)

## 2022-08-06 NOTE — Patient Instructions (Signed)
[name]  Discharge Instructions: Thank you for choosing [facility] to provide your oncology and hematology care.  If you have a lab appointment with the [facility], please come in thru the Main Entrance and check in at the main information desk.  Wear comfortable clothing and clothing appropriate for easy access to any Portacath or PICC line.   We strive to give you quality time with your provider. You may need to reschedule your appointment if you arrive late (15 or more minutes).  Arriving late affects you and other patients whose appointments are after yours.  Also, if you miss three or more appointments without notifying the office, you may be dismissed from the clinic at the provider's discretion.      For prescription refill requests, have your pharmacy contact our office and allow 72 hours for refills to be completed.    Today you received the therapeutic phlebotomy   BELOW ARE SYMPTOMS THAT SHOULD BE REPORTED IMMEDIATELY: *FEVER GREATER THAN 100.4 F (38 C) OR HIGHER *CHILLS OR SWEATING *NAUSEA AND VOMITING THAT IS NOT CONTROLLED WITH YOUR NAUSEA MEDICATION *UNUSUAL SHORTNESS OF BREATH *UNUSUAL BRUISING OR BLEEDING *URINARY PROBLEMS (pain or burning when urinating, or frequent urination) *BOWEL PROBLEMS (unusual diarrhea, constipation, pain near the anus) TENDERNESS IN MOUTH AND THROAT WITH OR WITHOUT PRESENCE OF ULCERS (sore throat, sores in mouth, or a toothache) UNUSUAL RASH, SWELLING OR PAIN  UNUSUAL VAGINAL DISCHARGE OR ITCHING   Items with * indicate a potential emergency and should be followed up as soon as possible or go to the Emergency Department if any problems should occur.  Please show the CHEMOTHERAPY ALERT CARD or IMMUNOTHERAPY ALERT CARD at check-in to the Emergency Department and triage nurse.  Should you have questions after your visit or need to cancel or reschedule your appointment, please contact [facility] [phone]  and follow the prompts.  Office hours are 8:00 a.m. to 4:30 p.m. Monday - Friday. Please note that voicemails left after 4:00 p.m. may not be returned until the following business day.  We are closed weekends and major holidays. You have access to a nurse at all times for urgent questions. Please call the main number to the clinic [phone] and follow the prompts.  For any non-urgent questions, you may also contact your provider using MyChart. We now offer e-Visits for anyone 71 and older to request care online for non-urgent symptoms. For details visit [email or web address].   Also download the MyChart app! Go to the app store, search "MyChart", open the app, select [facility], and log in with your MyChart username and password.  Masks are optional in the cancer centers. If you would like for your care team to wear a mask while they are taking care of you, please let them know. You may have one support person who is at least 70 years old accompany you for your appointments.

## 2022-08-06 NOTE — Progress Notes (Signed)
[name] presents today for theraputic phlebotomy per MD orders. Last hgb 15.9/hct 46.5 was on 08/06/22. VSS prior to procedure. Pt reports eating before arrival. Procedure started at 1330 using patients right AC. 500 mL of blood removed. Procedure ended at 1334. Gauze and coban applied to [facility], site clean and dry. VSS upon completion of procedure. Pt denies dizziness, lightheadedness, or feeling faint. Discharged in satisfactory condition with follow up instructions.

## 2022-08-09 DIAGNOSIS — G4733 Obstructive sleep apnea (adult) (pediatric): Secondary | ICD-10-CM | POA: Diagnosis not present

## 2022-09-16 ENCOUNTER — Ambulatory Visit (INDEPENDENT_AMBULATORY_CARE_PROVIDER_SITE_OTHER): Payer: Medicare Other | Admitting: Pharmacist

## 2022-09-16 ENCOUNTER — Ambulatory Visit (INDEPENDENT_AMBULATORY_CARE_PROVIDER_SITE_OTHER): Payer: Medicare Other

## 2022-09-16 DIAGNOSIS — Z0000 Encounter for general adult medical examination without abnormal findings: Secondary | ICD-10-CM

## 2022-09-16 DIAGNOSIS — I152 Hypertension secondary to endocrine disorders: Secondary | ICD-10-CM

## 2022-09-16 DIAGNOSIS — E1169 Type 2 diabetes mellitus with other specified complication: Secondary | ICD-10-CM

## 2022-09-16 NOTE — Progress Notes (Signed)
MEDICARE ANNUAL WELLNESS VISIT  09/16/2022  Telephone Visit Disclaimer This Medicare AWV was conducted by telephone due to national recommendations for restrictions regarding the COVID-19 Pandemic (e.g. social distancing).  I verified, using two identifiers, that I am speaking with [name] or their authorized healthcare agent. I discussed the limitations, risks, security, and privacy concerns of performing an evaluation and management service by telephone and the potential availability of an in-person appointment in the future. The patient expressed understanding and agreed to proceed.  Location of Patient: HOME Location of Provider (nurse):  [facility]  Subjective:    [name] is a 71 y.o. male patient of Dettinger, Fransisca Kaufmann, MD who had a Medicare Annual Wellness Visit today via telephone. [name] is Working part time and lives alone. He has no children. He reports that he is socially active and does interact with friends/family regularly. He is minimally physically active and enjoys reading, watching television and spending time with his family.  Patient Care Team: Dettinger, Fransisca Kaufmann, MD as PCP - General (Family Medicine) Glennie Isle, NP-C (Inactive) as Nurse Practitioner (Hematology and Oncology) Franchot Gallo, MD as Consulting Physician (Urology) Lavera Guise, [facility] as Pharmacist (Family Medicine)     09/16/2022   11:17 AM 08/06/2022    2:15 PM 06/11/2022    2:49 PM 06/11/2022    1:23 PM 02/19/2022    1:54 PM 12/25/2021    1:38 PM 09/15/2021   11:07 AM  Advanced Directives  Does Patient Have a Medical Advance Directive? Yes Yes Yes Yes Yes Yes Yes  Type of Advance Directive Healthcare Power of Attorney Living will;Healthcare Power of [place];Living will [place];Living will [place];Living will [place];Living will [place];Living will  Does patient  want to make changes to medical advance directive? No - Patient declined No - Patient declined  No - Patient declined No - Patient declined No - Patient declined   Copy of [place] in Chart?   No - copy requested No - copy requested No - copy requested No - copy requested No - copy requested  Would patient like information on creating a medical advance directive?  No - Patient declined No - Patient declined No - Patient declined No - Patient declined No - Patient declined     Hospital Utilization Over the Past 12 Months: # of hospitalizations or ER visits: 1 # of surgeries: 0  Review of Systems    Patient reports that his overall health is better compared to last year.  History obtained from chart review and the patient  Patient Reported Readings (BP, Pulse, CBG, Weight, etc) none  Pain Assessment Pain : 0-10 Pain Score: 4  Pain Type: Chronic pain Pain Location: Knee Pain Orientation: Right, Left Pain Descriptors / Indicators: Constant Pain Onset: More than a month ago Pain Frequency: Constant Pain Relieving Factors: Gabapentin  Pain Relieving Factors: Gabapentin  Current Medications & Allergies (verified) Allergies as of 09/16/2022       Reactions   Sulfa Antibiotics    rash   Ramipril Cough        Medication List        Accurate as of September 16, 2022 11:22 AM. If you have any questions, ask your nurse or doctor.          STOP taking these medications    cyanocobalamin 500 MCG tablet Commonly known as: VITAMIN B12  TAKE these medications    acetaminophen 325 MG tablet Commonly known as: Tylenol Take 2 tablets (650 mg total) by mouth every 6 (six) hours as needed for moderate pain or headache.   aspirin EC 81 MG tablet Take 81 mg by mouth every evening.   ferrous sulfate 325 (65 FE) MG tablet Take 325 mg by mouth daily with breakfast.   gabapentin 100 MG capsule Commonly known as: NEURONTIN Take 2 capsules (200 mg total)  by mouth at bedtime. What changed: when to take this   glucose blood test strip 1 each by Other route 2 (two) times daily. Use to check blood glucose once daily at varying times.  Dispense Verio One Touch Test strips   hydrOXYzine 25 MG tablet Commonly known as: ATARAX Take 25 mg by mouth as needed. Cuts in half as needed   irbesartan 150 MG tablet Commonly known as: AVAPRO Take 1 tablet (150 mg total) by mouth daily.   metFORMIN 1000 MG tablet Commonly known as: GLUCOPHAGE Take 1 tablet (1,000 mg total) by mouth 2 (two) times daily with a meal.   multivitamin with minerals tablet Take 1 tablet by mouth daily.   OneTouch Delica Lancets 60A Misc Use to check blood glucose once daily at varying times   PROBIOTIC PO Take 1 capsule by mouth 3 (three) times a week.   rosuvastatin 5 MG tablet Commonly known as: Crestor Take 1 tablet (5 mg total) by mouth at bedtime.   Semaglutide (1 MG/DOSE) 4 MG/3ML Sopn Inject 1 mg as directed once a week.   tamsulosin 0.4 MG Caps capsule Commonly known as: FLOMAX Take 1 capsule (0.4 mg total) by mouth daily after supper.   traZODone 50 MG tablet Commonly known as: DESYREL Take 0.5-1 tablets (25-50 mg total) by mouth at bedtime as needed for sleep (For insomnia as needed).   Trulicity 1.5 VW/0.9WJ Sopn Generic drug: Dulaglutide Inject into the skin.        History (reviewed): Past Medical History:  Diagnosis Date   Diabetes mellitus without complication ([place])    GERD (gastroesophageal reflux disease)    Hx of [place] spotted fever [id number]   Hypertension    Sleep apnea    wears cpap    Past Surgical History:  Procedure Laterality Date   COLONOSCOPY  09/2012   Dr. Britta Mccreedy. Diverticulosis, descending adenomatous colon polyp, hyperplastic rectal polyp. ?mass in anal canal evaluated by surgery and consistent with internal hemorrhoid   COLONOSCOPY WITH PROPOFOL N/A 02/07/2019   Procedure: COLONOSCOPY WITH PROPOFOL;   Surgeon: Danie Binder, MD;  [facility];  Service: Endoscopy;  Laterality: N/A;  9:30am   KNEE SURGERY Left 10/2014   KNEE SURGERY Right 09/2015   POLYPECTOMY  02/07/2019   Procedure: POLYPECTOMY;  Surgeon: Danie Binder, MD;  Location: [facility];  Service: Endoscopy;;   Family History  Problem Relation Age of Onset   Heart disease Mother    Congestive Heart Failure Mother    Diabetes Father    Aneurysm Sister    Diabetes Paternal Uncle    Diabetes Paternal Grandmother    Colon cancer Neg Hx    Colon polyps Neg Hx    Social History   Socioeconomic History   Marital status: Single    Spouse name: Not on file   Number of children: 0   Years of education: 12   Highest education level: 12th grade  Occupational History   Occupation: Retired    Comment:  [employer]  Tobacco Use   Smoking status: Never   Smokeless tobacco: Never  Vaping Use   Vaping Use: Never used  Substance and Sexual Activity   Alcohol use: No   Drug use: No   Sexual activity: Not Currently  Other Topics Concern   Not on file  Social History Narrative   WORKED AT [employer] > 10 YRS. NOT MARRIED. FUR BABIES: 3 DOGS AND 5 CATS. SPENDS FREE TIME: [place], [place].   Social Determinants of Health   Financial Resource Strain: Low Risk  (09/15/2021)   Overall Financial Resource Strain (CARDIA)    Difficulty of Paying Living Expenses: Not hard at all  Food Insecurity: No Food Insecurity (09/15/2021)   Hunger Vital Sign    Worried About Running Out of Food in the Last Year: Never true    Ran Out of Food in the Last Year: Never true  Transportation Needs: No Transportation Needs (09/15/2021)   PRAPARE - Hydrologist (Medical): No    Lack of Transportation (Non-Medical): No  Physical Activity: Sufficiently Active (09/15/2021)   Exercise Vital Sign    Days of Exercise per Week: 7 days    Minutes of Exercise per Session: 30 min   Stress: No Stress Concern Present (09/15/2021)   [place]    Feeling of Stress : Not at all  Social Connections: Moderately Integrated (09/15/2021)   Social Connection and Isolation Panel [NHANES]    Frequency of Communication with Friends and Family: More than three times a week    Frequency of Social Gatherings with Friends and Family: More than three times a week    Attends Religious Services: More than 4 times per year    Active Member of [employer] or Organizations: Yes    Attends Archivist Meetings: More than 4 times per year    Marital Status: Never married    Activities of Daily Living    09/16/2022   11:18 AM 09/14/2022    9:45 AM  In your present state of health, do you have any difficulty performing the following activities:  Hearing? 1 0  Vision? 0 0  Difficulty concentrating or making decisions? 0 0  Walking or climbing stairs? 1 0  Comment knee pain   Dressing or bathing? 0 0  Doing errands, shopping? 0 0  Preparing Food and eating ? N N  Using the Toilet? N N  In the past six months, have you accidently leaked urine? N N  Do you have problems with loss of bowel control? N N  Managing your Medications? N N  Managing your Finances? N N  Housekeeping or managing your Housekeeping? N N    Patient Education/ Literacy How often do you need to have someone help you when you read instructions, pamphlets, or other written materials from your doctor or pharmacy?: 1 - Never What is the last grade level you completed in school?: 12th grade  Exercise Current Exercise Habits: Home exercise routine, Type of exercise: walking, Time (Minutes): 45, Frequency (Times/Week): 3, Weekly Exercise (Minutes/Week): 135, Exercise limited by: orthopedic condition(s)  Diet Patient reports consuming 3 meals a day and 1 snack(s) a day Patient reports that his primary diet is: Diabetic Patient reports that she does  have regular access to food.   Depression Screen    09/16/2022   11:22 AM 05/21/2022    8:01 AM 02/06/2022  8:07 AM 11/05/2021    7:58 AM 09/15/2021   11:07 AM 08/27/2021   11:43 AM 07/29/2021   10:55 AM  PHQ 2/9 Scores  PHQ - 2 Score 0 0 0 '2 1 1 '$ 0  PHQ- 9 Score    '5 4 4      '$ Fall Risk    09/16/2022   11:22 AM 09/14/2022    9:45 AM 05/21/2022    8:01 AM 02/06/2022    8:07 AM 11/05/2021    7:57 AM  Fall Risk   Falls in the past year? 0 0 0 0 0  Follow up Falls evaluation completed         Objective:  [name] seemed alert and oriented and he participated appropriately during our telephone visit.  Blood Pressure Weight BMI  BP Readings from Last 3 Encounters:  08/06/22 130/73  06/11/22 122/82  06/11/22 (!) 146/74   Wt Readings from Last 3 Encounters:  06/11/22 249 lb 9 oz (113.2 kg)  05/21/22 252 lb (114.3 kg)  03/13/22 249 lb (112.9 kg)   BMI Readings from Last 1 Encounters:  06/11/22 35.81 kg/m    *Unable to obtain current vital signs, weight, and BMI due to telephone visit type  Hearing/Vision  Joneen Boers did not seem to have difficulty with hearing/understanding during the telephone conversation Reports that he has not had a formal eye exam by an eye care professional within the past year Reports that he has not had a formal hearing evaluation within the past year *Unable to fully assess hearing and vision during telephone visit type  Cognitive Function:    09/16/2022   11:20 AM 09/13/2020   10:47 AM 09/13/2019   10:47 AM  6CIT Screen  What Year? 0 points 0 points 0 points  What month? 0 points 0 points 0 points  What time? 0 points  0 points  Count back from 20 0 points  0 points  Months in reverse 0 points  0 points  Repeat phrase 2 points  2 points  Total Score 2 points  2 points   (Normal:0-7, Significant for Dysfunction: >8)  Normal Cognitive Function Screening: Yes   Immunization & Health Maintenance Record Immunization History  Administered  Date(s) Administered   Fluad Quad(high Dose 65+) 10/10/2019, 08/20/2020, 10/14/2021   Influenza,inj,Quad PF,6+ Mos 09/25/2014, 10/22/2016, 09/22/2017, 09/23/2018   Influenza-Unspecified 08/20/2020   Moderna Sars-Covid-2 Vaccination 01/25/2020, 02/22/2020   Pneumococcal Conjugate-13 09/27/2020   Pneumococcal Polysaccharide-23 10/14/2021   Tdap 09/25/2014   Zoster Recombinat (Shingrix) 04/20/2018    Health Maintenance  Topic Date Due   COVID-19 Vaccine (3 - Moderna risk series) 03/21/2020   FOOT EXAM  09/07/2020   OPHTHALMOLOGY EXAM  09/17/2021   Diabetic kidney evaluation - Urine ACR  07/24/2022   INFLUENZA VACCINE  10/13/2022 (Originally 07/14/2022)   Zoster Vaccines- Shingrix (2 of 2) 05/03/2023 (Originally 06/15/2018)   HEMOGLOBIN A1C  11/20/2022   Diabetic kidney evaluation - GFR measurement  05/22/2023   TETANUS/TDAP  09/25/2024   COLONOSCOPY (Pts 45-38yr Insurance coverage will need to be confirmed)  02/07/2029   Pneumonia Vaccine 70 Years old  Completed   Hepatitis C Screening  Completed   HPV VACCINES  Aged Out       Assessment  This is a routine wellness examination for [facility]  Health Maintenance: Due or Overdue Health Maintenance Due  Topic Date Due   COVID-19 Vaccine (3 - Moderna risk series) 03/21/2020   FOOT EXAM  09/07/2020   OPHTHALMOLOGY  EXAM  09/17/2021   Diabetic kidney evaluation - Urine ACR  07/24/2022    [name] does not need a referral for Community Assistance: Care Management:   no Social Work:    no Prescription Assistance:  no Nutrition/Diabetes Education:  no   Plan:  Personalized Goals  Goals Addressed             This Visit's Progress    Patient Stated       09/16/2022 AWV Goal: Fall Prevention  Over the next year, patient will decrease their risk for falls by: Using assistive devices, such as a cane or walker, as needed Identifying fall risks within their home and correcting them by: Removing throw  rugs Adding handrails to stairs or ramps Removing clutter and keeping a clear pathway throughout the home Increasing light, especially at night Adding shower handles/bars Raising toilet seat Identifying potential personal risk factors for falls: Medication side effects Incontinence/urgency Vestibular dysfunction Hearing loss Musculoskeletal disorders Neurological disorders Orthostatic hypotension         Personalized Health Maintenance & Screening Recommendations  Eye exam Foot exam Urine microalbumin  Lung Cancer Screening Recommended: no (Low Dose CT Chest recommended if Age 9-80 years, 30 pack-year currently smoking OR have quit w/in past 15 years) Hepatitis C Screening recommended: no HIV Screening recommended: no  Advanced Directives: Written information was not prepared per patient's request.  Referrals & Orders No orders of the defined types were placed in this encounter.   Follow-up Plan Follow-up with Dettinger, Fransisca Kaufmann, MD as planned   I have personally reviewed and noted the following in the patient's chart:   Medical and social history Use of alcohol, tobacco or illicit drugs  Current medications and supplements Functional ability and status Nutritional status Physical activity Advanced directives List of other physicians Hospitalizations, surgeries, and ER visits in previous 12 months Vitals Screenings to include cognitive, depression, and falls Referrals and appointments  In addition, I have reviewed and discussed with [name] certain preventive protocols, quality metrics, and best practice recommendations. A written personalized care plan for preventive services as well as general preventive health recommendations is available and can be mailed to the patient at his request.      Felicity Coyer LPN    04/15/9766  Patient declined after visit summary

## 2022-09-25 NOTE — Progress Notes (Signed)
Chronic Care Management Pharmacy Note  09/16/2022 Name:  [name] MRN:  [id number] DOB:  06/11/1951  Summary:  Diabetes: Goal on Track (progressing): YES. Uncontrolled--A1C >14%-->8.4%-->7.6% (MUCH IMPROVED); GFR >60 Current treatment: ozempic, METFORMIN 1G TWICE DAILY;  Continue Ozempic 19m weekly-->will plan to increase to ozempic 22mweekly (dose change form submitted to novo nordisk patient assistance program) Patient assistance supply arrived; previously on trulicity, but changed due to cost/patient assistance Denies personal and family history of Medullary thyroid cancer (MTC) Current glucose readings: fasting glucose: <150 (improved), post prandial glucose: NOT CHECKING Discussed meal planning options and Plate method for healthy eating PATIENT REPORTS HE HAS CUT OUT FAST FOOD, HE IS DRINKING MORE WATER, EATING LESS/HEALTHIER CHOICES (I/E SALADS) Avoid sugary drinks and desserts Incorporate balanced protein, non starchy veggies, 1 serving of carbohydrate with each meal Increase water intake Increase physical activity as able Current exercise: N/A; ENCOURAGED Recommended switch to ozempic, MAY ADD SGLT2 AT F/U AS WELL Assessed patient finances. Novo nordisk PATIENT ASSISTANCE  Hyperlipidemia:  Goal on Track (progressing): YES. controlled; current treatment:ROSUVASTATIN Medications previously tried: N/A  Current dietary patterns: ENCOURAGED HEART HEALTHY, MEDITERRANEAN  Counseled on DIETARY CHANGES; CONTINUE CURRENT THERAPY Lipid Panel     Component Value Date/Time   CHOL 102 11/05/2021 0801   TRIG 171 (H) 11/05/2021 0801   HDL 31 (L) 11/05/2021 0801   CHOLHDL 3.3 11/05/2021 0801   CHOLHDL 4.1 09/20/2020 0921   VLDL 15 09/20/2020 0921   LDLCALC 42 11/05/2021 0801   LABVLDL 29 11/05/2021 0801    Patient Goals/Self-Care Activities patient will:  - take medications as prescribed as evidenced by patient report and record review check glucose DAILY, document,  and provide at future appointments collaborate with provider on medication access solutions target a minimum of 150 minutes of moderate intensity exercise weekly engage in dietary modifications by HEART HEALTHY, HEALTHY PLATE METHOD FOR DIABETES  Subjective: HaJarrid Lienhards an [age].o. year old male who is a primary patient of Dettinger, JoFransisca KaufmannMD.  The CCM team was consulted for assistance with disease management and care coordination needs.    Engaged with patient by telephone for follow up visit in response to provider referral for pharmacy case management and/or care coordination services.   Consent to Services:  The patient was given information about Chronic Care Management services, agreed to services, and gave verbal consent prior to initiation of services.  Please see initial visit note for detailed documentation.   Patient Care Team: Dettinger, JoFransisca KaufmannMD as PCP - General (Family Medicine) LoGlennie IsleNP-C (Inactive) as Nurse Practitioner (Hematology and Oncology) DaFranchot GalloMD as Consulting Physician (Urology) PrLavera GuiseRPJackson Norths Pharmacist (Family Medicine)  Objective:  Lab Results  Component Value Date   CREATININE 1.16 05/21/2022   CREATININE 1.38 (H) 11/05/2021   CREATININE 1.22 07/24/2021    Lab Results  Component Value Date   HGBA1C 7.6 (H) 05/21/2022   Last diabetic Eye exam:  Lab Results  Component Value Date/Time   HMDIABEYEEXA No Retinopathy 09/17/2020 12:00 AM       Component Value Date/Time   CHOL 99 (L) 05/21/2022 0806   TRIG 93 05/21/2022 0806   HDL 35 (L) 05/21/2022 0806   CHOLHDL 2.8 05/21/2022 0806   CHOLHDL 4.1 09/20/2020 0921   VLDL 15 09/20/2020 0921   LDLCALC 46 05/21/2022 0806       Latest Ref Rng & Units 05/21/2022    8:06 AM 11/05/2021  8:01 AM 07/24/2021    8:19 AM  Hepatic Function  Total Protein 6.0 - 8.5 g/dL 7.1  7.2  7.4   Albumin 3.8 - 4.8 g/dL 4.2  4.3  4.6   AST 0 - 40 IU/L 34  16  25   ALT  0 - 44 IU/L _0 Alk Phosphatase 44 - 121 IU/L 79  126  74   Total Bilirubin 0.0 - 1.2 mg/dL 1.0  0.5  0.7     Lab Results  Component Value Date/Time   TSH 2.389 10/19/2019 12:40 PM   TSH 4.010 09/08/2019 10:54 AM   TSH 4.370 05/03/2018 11:21 AM       Latest Ref Rng & Units 08/06/2022   12:32 PM 06/11/2022   11:59 AM 05/21/2022    8:06 AM  CBC  WBC 4.0 - 10.5 K/uL 7.6  6.1  6.0   Hemoglobin 13.0 - 17.0 g/dL 15.9  16.5  16.7   Hematocrit 39.0 - 52.0 % 46.5  47.8  49.5   Platelets 150 - 400 K/uL 205  231  220     Lab Results  Component Value Date/Time   VD25OH 36.36 09/20/2020 09:21 AM   VD25OH 31.92 08/02/2020 09:30 AM    Clinical ASCVD: No  The ASCVD Risk score (Arnett DK, et al., 2019) failed to calculate for the following reasons:   The valid total cholesterol range is 130 to 320 mg/dL    Other: (CHADS2VASc if Afib, PHQ9 if depression, MMRC or CAT for COPD, ACT, DEXA)  Social History   Tobacco Use  Smoking Status Never  Smokeless Tobacco Never   BP Readings from Last 3 Encounters:  08/06/22 130/73  06/11/22 122/82  06/11/22 (!) 146/74   Pulse Readings from Last 3 Encounters:  08/06/22 62  06/11/22 64  05/21/22 62   Wt Readings from Last 3 Encounters:  06/11/22 249 lb 9 oz (113.2 kg)  05/21/22 252 lb (114.3 kg)  03/13/22 249 lb (112.9 kg)    Assessment: Review of patient past medical history, allergies, medications, health status, including review of consultants reports, laboratory and other test data, was performed as part of comprehensive evaluation and provision of chronic care management services.   SDOH:  (Social Determinants of Health) assessments and interventions performed:  SDOH Interventions    Flowsheet Row Clinical Support from 09/15/2021 in [place] Visit from 07/29/2021 in [place] Interventions    Food Insecurity Interventions Intervention Not Indicated --  Housing  Interventions Intervention Not Indicated --  Transportation Interventions Intervention Not Indicated --  Depression Interventions/Treatment  PHQ2-9 Score <4 Follow-up Not Indicated Counseling  Financial Strain Interventions Intervention Not Indicated --  Physical Activity Interventions Intervention Not Indicated --  Stress Interventions Intervention Not Indicated --  Social Connections Interventions Intervention Not Indicated --       CCM Care Plan  Allergies  Allergen Reactions   Sulfa Antibiotics     rash   Ramipril Cough    Medications Reviewed Today     Reviewed by Lavera Guise, [facility] (Pharmacist) on 09/25/22 at 1029  Med List Status: <None>   Medication Order Taking? Sig Documenting Provider Last Dose Status Informant  acetaminophen (TYLENOL) 325 MG tablet [id number] No Take 2 tablets (650 mg total) by mouth every 6 (six) hours as needed for moderate pain or headache. Jaynee Eagles, PA-C Taking Active   aspirin EC 81 MG tablet [id number] No Take  81 mg by mouth every evening. [provider] Taking Active Self  ferrous sulfate 325 (65 FE) MG tablet [id number] No Take 325 mg by mouth daily with breakfast. [provider] Taking Active   gabapentin (NEURONTIN) 100 MG capsule [id number] No Take 2 capsules (200 mg total) by mouth at bedtime.  Patient taking differently: Take 200 mg by mouth 2 (two) times daily.   Dettinger, Fransisca Kaufmann, MD Taking Active   glucose blood test strip [id number] No 1 each by Other route 2 (two) times daily. Use to check blood glucose once daily at varying times.  Dispense Verio One Touch Test strips Dettinger, Fransisca Kaufmann, MD Taking Active   hydrOXYzine (ATARAX/VISTARIL) 25 MG tablet [id number] No Take 25 mg by mouth as needed. Cuts in half as needed [provider] Taking Active   irbesartan (AVAPRO) 150 MG tablet [id number] No Take 1 tablet (150 mg total) by mouth daily. Dettinger, Fransisca Kaufmann, MD Taking Active   metFORMIN (GLUCOPHAGE) 1000  MG tablet [id number] No Take 1 tablet (1,000 mg total) by mouth 2 (two) times daily with a meal. Dettinger, Fransisca Kaufmann, MD Taking Active   Multiple Vitamins-Minerals (MULTIVITAMIN WITH MINERALS) tablet [id number] No Take 1 tablet by mouth daily. [provider] Taking Active   [facility] LANCETS 99B MISC [id number] No Use to check blood glucose once daily at varying times Eckard, Tammy, RPH-CPP Taking Active Self  Probiotic Product (PROBIOTIC PO) [id number] No Take 1 capsule by mouth 3 (three) times a week.  [provider] Taking Active Self  rosuvastatin (CRESTOR) 5 MG tablet [id number] No Take 1 tablet (5 mg total) by mouth at bedtime. Dettinger, Fransisca Kaufmann, MD Taking Active   Semaglutide, 1 MG/DOSE, 4 MG/3ML SOPN [id number] No Inject 1 mg as directed once a week. Dettinger, Fransisca Kaufmann, MD Taking Active            Med Note Parthenia Ames Jun 25, 2022  8:43 AM) Via novo nordisk patient assistance program    tamsulosin (FLOMAX) 0.4 MG CAPS capsule [id number] No Take 1 capsule (0.4 mg total) by mouth daily after supper. Dettinger, Fransisca Kaufmann, MD Taking Active   traZODone (DESYREL) 50 MG tablet [id number] No Take 0.5-1 tablets (25-50 mg total) by mouth at bedtime as needed for sleep (For insomnia as needed). Dettinger, Fransisca Kaufmann, MD Taking Active   Patient not taking:  Discontinued 09/25/22 1029 (Change in therapy)             Patient Active Problem List   Diagnosis Date Noted   Polycythemia, secondary 04/12/2019   History of colonic polyps 01/23/2019   History of rectal bleeding 08/25/2018   OSA on CPAP 06/21/2017   Diabetes mellitus type 2 in obese ([place]) 10/22/2016   Elevated PSA 10/22/2016   Hypertension associated with diabetes ([place]) 02/03/2011    Immunization History  Administered Date(s) Administered   Fluad Quad(high Dose 65+) 10/10/2019, 08/20/2020, 10/14/2021   Influenza,inj,Quad PF,6+ Mos 09/25/2014, 10/22/2016, 09/22/2017, 09/23/2018   Influenza-Unspecified  08/20/2020   Moderna Sars-Covid-2 Vaccination 01/25/2020, 02/22/2020   Pneumococcal Conjugate-13 09/27/2020   Pneumococcal Polysaccharide-23 10/14/2021   Tdap 09/25/2014   Zoster Recombinat (Shingrix) 04/20/2018    Conditions to be addressed/monitored: HLD and DMII  Care Plan : PHARMD MEDICATION MANAGEMENT  Updates made by Lavera Guise, [place] since 09/25/2022 12:00 AM     Problem: DISEASE PROGRESSION PREVENTION      Long-Range Goal: T2DM PHARMD GOAL   This Visit's Progress: On track  Recent Progress: Not on track  Priority: High  Note:   Current Barriers:  Unable to independently afford treatment regimen Unable to achieve control of T2DM  Suboptimal therapeutic regimen for T2DM  Pharmacist Clinical Goal(s):  patient will verbalize ability to afford treatment regimen achieve control of T2DM as evidenced by GOAL A1C<7%, IMPROVED GLYCEMIC CONTROL  through collaboration with PharmD and provider.   Interventions: 1:1 collaboration with Dettinger, Fransisca Kaufmann, MD regarding development and update of comprehensive plan of care as evidenced by provider attestation and co-signature Inter-disciplinary care team collaboration (see longitudinal plan of care) Comprehensive medication review performed; medication list updated in electronic medical record  Diabetes: Goal on Track (progressing): YES. Uncontrolled--A1C >14%-->8.4%-->7.6% (MUCH IMPROVED); GFR >60 Current treatment: ozempic, METFORMIN 1G TWICE DAILY;  Continue Ozempic 66m weekly-->will plan to increase to ozempic 2109mweekly (dose change form submitted to novo nordisk patient assistance program) Patient assistance supply arrived; previously on trulicity, but changed due to cost/patient assistance Denies personal and family history of Medullary thyroid cancer (MTC) Current glucose readings: fasting glucose: <150 (improved), post prandial glucose: NOT CHECKING Discussed meal planning options and Plate method for healthy  eating PATIENT REPORTS HE HAS CUT OUT FAST FOOD, HE IS DRINKING MORE WATER, EATING LESS/HEALTHIER CHOICES (I/E SALADS) Avoid sugary drinks and desserts Incorporate balanced protein, non starchy veggies, 1 serving of carbohydrate with each meal Increase water intake Increase physical activity as able Current exercise: N/A; ENCOURAGED Recommended switch to ozempic, MAY ADD SGLT2 AT F/U AS WELL Assessed patient finances. Novo nordisk PATIENT ASSISTANCE  Hyperlipidemia:  Goal on Track (progressing): YES. controlled; current treatment:ROSUVASTATIN Medications previously tried: N/A  Current dietary patterns: ENCOURAGED HEART HEALTHY, MEDITERRANEAN  Counseled on DIETARY CHANGES; CONTINUE CURRENT THERAPY Lipid Panel     Component Value Date/Time   CHOL 102 11/05/2021 0801   TRIG 171 (H) 11/05/2021 0801   HDL 31 (L) 11/05/2021 0801   CHOLHDL 3.3 11/05/2021 0801   CHOLHDL 4.1 09/20/2020 0921   VLDL 15 09/20/2020 0921   LDLCALC 42 11/05/2021 0801   LABVLDL 29 11/05/2021 0801   Patient Goals/Self-Care Activities patient will:  - take medications as prescribed as evidenced by patient report and record review check glucose DAILY, document, and provide at future appointments collaborate with provider on medication access solutions target a minimum of 150 minutes of moderate intensity exercise weekly engage in dietary modifications by HEART HEALTHY, HEALTHY PLATE METHOD FOR DIABETES      Medication Assistance:  ozempic obtained through novo nordisk medication assistance program.  Enrollment ends 12/13/22  [facility]  [facility] [place]hone: [phone]: [phone] Up:  Patient agrees to Care Plan and Follow-up.  Plan: Telephone follow up appointment with care management team member scheduled for:  11/2022  JuRegina EckPharmD, BCPS Clinical Pharmacist, [place] Phone [phone]

## 2022-09-25 NOTE — Patient Instructions (Signed)
Visit Information  Following are the goals we discussed today:  Current Barriers:  Unable to independently afford treatment regimen Unable to achieve control of T2DM  Suboptimal therapeutic regimen for T2DM  Pharmacist Clinical Goal(s):  patient will verbalize ability to afford treatment regimen achieve control of T2DM as evidenced by GOAL A1C<7%, IMPROVED GLYCEMIC CONTROL through collaboration with PharmD and provider.   Interventions: 1:1 collaboration with Dettinger, Fransisca Kaufmann, MD regarding development and update of comprehensive plan of care as evidenced by provider attestation and co-signature Inter-disciplinary care team collaboration (see longitudinal plan of care) Comprehensive medication review performed; medication list updated in electronic medical record  Diabetes: Goal on Track (progressing): YES. Uncontrolled--A1C >14%-->8.4%-->7.6% (MUCH IMPROVED); GFR >60 Current treatment: ozempic, METFORMIN 1G TWICE DAILY;  Continue Ozempic '1mg'$  weekly-->will plan to increase to ozempic '2mg'$  weekly (dose change form submitted to novo nordisk patient assistance program) Patient assistance supply arrived; previously on trulicity, but changed due to cost/patient assistance Denies personal and family history of Medullary thyroid cancer (MTC) Current glucose readings: fasting glucose: <150 (improved), post prandial glucose: NOT CHECKING Discussed meal planning options and Plate method for healthy eating PATIENT REPORTS HE HAS CUT OUT FAST FOOD, HE IS DRINKING MORE WATER, EATING LESS/HEALTHIER CHOICES (I/E SALADS) Avoid sugary drinks and desserts Incorporate balanced protein, non starchy veggies, 1 serving of carbohydrate with each meal Increase water intake Increase physical activity as able Current exercise: N/A; ENCOURAGED Recommended switch to ozempic, MAY ADD SGLT2 AT F/U AS WELL Assessed patient finances. Novo nordisk PATIENT ASSISTANCE  Hyperlipidemia:  Goal on Track (progressing):  YES. controlled; current treatment:ROSUVASTATIN Medications previously tried: N/A  Current dietary patterns: ENCOURAGED HEART HEALTHY, MEDITERRANEAN  Counseled on DIETARY CHANGES; CONTINUE CURRENT THERAPY Lipid Panel     Component Value Date/Time   CHOL 102 11/05/2021 0801   TRIG 171 (H) 11/05/2021 0801   HDL 31 (L) 11/05/2021 0801   CHOLHDL 3.3 11/05/2021 0801   CHOLHDL 4.1 09/20/2020 0921   VLDL 15 09/20/2020 0921   LDLCALC 42 11/05/2021 0801   LABVLDL 29 11/05/2021 0801    Patient Goals/Self-Care Activities patient will:  - take medications as prescribed as evidenced by patient report and record review check glucose DAILY, document, and provide at future appointments collaborate with provider on medication access solutions target a minimum of 150 minutes of moderate intensity exercise weekly engage in dietary modifications by HEART HEALTHY, HEALTHY PLATE METHOD FOR DIABETES   Plan: Telephone follow up appointment with care management team member scheduled for:  11/2022  Signature Regina Eck, PharmD, BCPS Clinical Pharmacist, [place] Phone [phone]   Please call the care guide team at [phone] if you need to cancel or reschedule your appointment.   The patient verbalized understanding of instructions, educational materials, and care plan provided today and DECLINED offer to receive copy of patient instructions, educational materials, and care plan.

## 2022-10-01 ENCOUNTER — Inpatient Hospital Stay: Payer: Medicare Other

## 2022-10-01 ENCOUNTER — Inpatient Hospital Stay: Payer: Medicare Other | Attending: Hematology

## 2022-10-01 DIAGNOSIS — D751 Secondary polycythemia: Secondary | ICD-10-CM | POA: Insufficient documentation

## 2022-10-01 LAB — CBC WITH DIFFERENTIAL/PLATELET
Abs Immature Granulocytes: 0.01 10*3/uL (ref 0.00–0.07)
Basophils Absolute: 0.1 10*3/uL (ref 0.0–0.1)
Basophils Relative: 1 %
Eosinophils Absolute: 0.1 10*3/uL (ref 0.0–0.5)
Eosinophils Relative: 2 %
HCT: 48.2 % (ref 39.0–52.0)
Hemoglobin: 16.6 g/dL (ref 13.0–17.0)
Immature Granulocytes: 0 %
Lymphocytes Relative: 22 %
Lymphs Abs: 1.4 10*3/uL (ref 0.7–4.0)
MCH: 31.1 pg (ref 26.0–34.0)
MCHC: 34.4 g/dL (ref 30.0–36.0)
MCV: 90.3 fL (ref 80.0–100.0)
Monocytes Absolute: 0.5 10*3/uL (ref 0.1–1.0)
Monocytes Relative: 8 %
Neutro Abs: 4.3 10*3/uL (ref 1.7–7.7)
Neutrophils Relative %: 67 %
Platelets: 243 10*3/uL (ref 150–400)
RBC: 5.34 MIL/uL (ref 4.22–5.81)
RDW: 13.2 % (ref 11.5–15.5)
WBC: 6.3 10*3/uL (ref 4.0–10.5)
nRBC: 0 % (ref 0.0–0.2)

## 2022-10-01 NOTE — Patient Instructions (Signed)
[facility]  Discharge Instructions: Thank you for choosing [facility] to provide your oncology and hematology care.  If you have a lab appointment with the Cancer Center, please come in thru the Main Entrance and check in at the main information desk.  Wear comfortable clothing and clothing appropriate for easy access to any Portacath or PICC line.   We strive to give you quality time with your provider. You may need to reschedule your appointment if you arrive late (15 or more minutes).  Arriving late affects you and other patients whose appointments are after yours.  Also, if you miss three or more appointments without notifying the office, you may be dismissed from the clinic at the provider's discretion.      For prescription refill requests, have your pharmacy contact our office and allow 72 hours for refills to be completed.    Today you received a phlebotomy.       To help prevent nausea and vomiting after your treatment, we encourage you to take your nausea medication as directed.  BELOW ARE SYMPTOMS THAT SHOULD BE REPORTED IMMEDIATELY: *FEVER GREATER THAN 100.4 F (38 C) OR HIGHER *CHILLS OR SWEATING *NAUSEA AND VOMITING THAT IS NOT CONTROLLED WITH YOUR NAUSEA MEDICATION *UNUSUAL SHORTNESS OF BREATH *UNUSUAL BRUISING OR BLEEDING *URINARY PROBLEMS (pain or burning when urinating, or frequent urination) *BOWEL PROBLEMS (unusual diarrhea, constipation, pain near the anus) TENDERNESS IN MOUTH AND THROAT WITH OR WITHOUT PRESENCE OF ULCERS (sore throat, sores in mouth, or a toothache) UNUSUAL RASH, SWELLING OR PAIN  UNUSUAL VAGINAL DISCHARGE OR ITCHING   Items with * indicate a potential emergency and should be followed up as soon as possible or go to the Emergency Department if any problems should occur.  Please show the CHEMOTHERAPY ALERT CARD or IMMUNOTHERAPY ALERT CARD at check-in to the Emergency Department and triage nurse.  Should you have  questions after your visit or need to cancel or reschedule your appointment, please contact [facility] [phone]  and follow the prompts.  Office hours are 8:00 a.m. to 4:30 p.m. Monday - Friday. Please note that voicemails left after 4:00 p.m. may not be returned until the following business day.  We are closed weekends and major holidays. You have access to a nurse at all times for urgent questions. Please call the main number to the clinic [phone] and follow the prompts.  For any non-urgent questions, you may also contact your provider using MyChart. We now offer e-Visits for anyone 18 and older to request care online for non-urgent symptoms. For details visit [email or web address].   Also download the MyChart app! Go to the app store, search "MyChart", open the app, select [facility], and log in with your MyChart username and password.  Masks are optional in the cancer centers. If you would like for your care team to wear a mask while they are taking care of you, please let them know. You may have one support person who is at least 70 years old accompany you for your appointments.  

## 2022-10-01 NOTE — Progress Notes (Signed)
[name] presents today for phlebotomy per MD orders. Phlebotomy procedure started at 1454 and ended at 1458. 500 ml removed. Patient did not want to wait his observation period.  Patient tolerated procedure well. IV needle removed intact.  Vitals stable and discharged home from clinic ambulatory. Follow up as scheduled.

## 2022-10-13 DIAGNOSIS — Z7984 Long term (current) use of oral hypoglycemic drugs: Secondary | ICD-10-CM | POA: Diagnosis not present

## 2022-10-13 DIAGNOSIS — Z7985 Long-term (current) use of injectable non-insulin antidiabetic drugs: Secondary | ICD-10-CM | POA: Diagnosis not present

## 2022-10-13 DIAGNOSIS — E1169 Type 2 diabetes mellitus with other specified complication: Secondary | ICD-10-CM

## 2022-10-13 DIAGNOSIS — E785 Hyperlipidemia, unspecified: Secondary | ICD-10-CM

## 2022-11-03 ENCOUNTER — Ambulatory Visit (INDEPENDENT_AMBULATORY_CARE_PROVIDER_SITE_OTHER): Payer: Medicare Other

## 2022-11-03 DIAGNOSIS — Z23 Encounter for immunization: Secondary | ICD-10-CM | POA: Diagnosis not present

## 2022-11-23 NOTE — Progress Notes (Signed)
[facility] Quality Team Note  Name: [name] Date of Birth: [birth year] MRN: [id number] Date: 11/23/2022  [facility] Quality Team has reviewed this patient's chart, please see recommendations below:  [facility] Quality Other; (KED: Kidney Health Evaluation Gap- Patient needs Urine Albumin Creatinine Ratio Test completed for gap closure. EGFR has already been completed).

## 2022-11-25 ENCOUNTER — Telehealth: Payer: Self-pay | Admitting: Pharmacist

## 2022-11-25 ENCOUNTER — Ambulatory Visit (INDEPENDENT_AMBULATORY_CARE_PROVIDER_SITE_OTHER): Payer: Medicare Other | Admitting: Pharmacist

## 2022-11-25 VITALS — BP 130/75 | HR 75

## 2022-11-25 DIAGNOSIS — E669 Obesity, unspecified: Secondary | ICD-10-CM

## 2022-11-25 DIAGNOSIS — I152 Hypertension secondary to endocrine disorders: Secondary | ICD-10-CM

## 2022-11-25 NOTE — Telephone Encounter (Signed)
Please make sure patient is re-enrolled for ozempic '2mg'$   Thank you!

## 2022-11-26 NOTE — Progress Notes (Signed)
[name], [name]   CLINIC:  Medical Oncology/Hematology  PCP:  Dettinger, Fransisca Kaufmann, MD [place] [address] [phone]   REASON FOR VISIT:  Follow-up for secondary polycythemia   PRIOR THERAPY: None   CURRENT THERAPY: Intermittent phlebotomies, last on 04/16/2022   INTERVAL HISTORY:  [name] 71 y.o. male returns for routine follow-up of secondary polycythemia.  He was last seen by Tarri Abernethy PA-C on 06/11/2022.    At today's visit, he reports feeling well.  No recent hospitalizations, surgeries, or changes in baseline health status.  He had phlebotomy x 3 since his last visit (every 2 months), most recently on 10/01/2022.  He has been tolerating his phlebotomies well.  He reports that when he misses phlebotomy, he has increased symptoms of thirst and excessive heat.   No erythromelalgia, aquagenic pruritus, Raynaud's, or vasomotor symptoms.  No fever, chills, or unintentional weight loss.  He remains compliant with CPAP.  He is taking oral ferrous sulfate once daily.  He has 80% energy and 80% appetite. He endorses that he has been working on some intentional weight loss via diet and exercise, and has also been placed on Ozempic by his PCP.   REVIEW OF SYSTEMS:    Review of Systems  Constitutional:  Positive for fatigue (mild, stable, energy 80%). Negative for appetite change, chills, diaphoresis, fever and unexpected weight change.  HENT:   Negative for lump/mass and nosebleeds.   Eyes:  Negative for eye problems.  Respiratory:  Positive for cough (sinus drainage). Negative for hemoptysis and shortness of breath.   Cardiovascular:  Negative for chest pain, leg swelling and palpitations.  Gastrointestinal:  Negative for abdominal pain, blood in stool, constipation, diarrhea, nausea and vomiting.  Endocrine: Positive for hot flashes (feels hot at night).  Genitourinary:  Negative for hematuria.    Musculoskeletal:  Positive for arthralgias.  Skin: Negative.   Neurological:  Negative for dizziness, headaches and light-headedness.  Hematological:  Does not bruise/bleed easily.     PAST MEDICAL/SURGICAL HISTORY:  Past Medical History:  Diagnosis Date   Diabetes mellitus without complication ([place])    GERD (gastroesophageal reflux disease)    Hx of [place] spotted fever 1-1-12011   Hypertension    Sleep apnea    wears cpap    Past Surgical History:  Procedure Laterality Date   COLONOSCOPY  09/2012   Dr. Britta Mccreedy. Diverticulosis, descending adenomatous colon polyp, hyperplastic rectal polyp. ?mass in anal canal evaluated by surgery and consistent with internal hemorrhoid   COLONOSCOPY WITH PROPOFOL N/A 02/07/2019   Procedure: COLONOSCOPY WITH PROPOFOL;  Surgeon: Danie Binder, MD;  Location: [facility];  Service: Endoscopy;  Laterality: N/A;  9:30am   KNEE SURGERY Left 10/2014   KNEE SURGERY Right 09/2015   POLYPECTOMY  02/07/2019   Procedure: POLYPECTOMY;  Surgeon: Danie Binder, MD;  Location: [facility];  Service: Endoscopy;;     SOCIAL HISTORY:  Social History   Socioeconomic History   Marital status: Single    Spouse name: Not on file   Number of children: 0   Years of education: 12   Highest education level: 12th grade  Occupational History   Occupation: Retired    Comment: Pensions consultant and gam[employer]  Tobacco Use   Smoking status: Never   Smokeless tobacco: Never  Vaping Use   Vaping Use: Never used  Substance and Sexual Activity   Alcohol use: No  Drug use: No   Sexual activity: Not Currently  Other Topics Concern   Not on file  Social History Narrative   WORKED AT [employer] > 10 YRS. NOT MARRIED. FUR BABIES: 3 DOGS AND 5 CATS. SPENDS FREE TIME: [place], [place], [place].   Social Determinants of Health   Financial Resource Strain: Low Risk  (09/15/2021)   Overall Financial Resource Strain (CARDIA)    Difficulty of Paying  Living Expenses: Not hard at all  Food Insecurity: No Food Insecurity (09/15/2021)   Hunger Vital Sign    Worried About Running Out of Food in the Last Year: Never true    Ran Out of Food in the Last Year: Never true  Transportation Needs: No Transportation Needs (09/15/2021)   PRAPARE - Hydrologist (Medical): No    Lack of Transportation (Non-Medical): No  Physical Activity: Sufficiently Active (09/15/2021)   Exercise Vital Sign    Days of Exercise per Week: 7 days    Minutes of Exercise per Session: 30 min  Stress: No Stress Concern Present (09/15/2021)   [place]    Feeling of Stress : Not at all  Social Connections: Moderately Integrated (09/15/2021)   Social Connection and Isolation Panel [NHANES]    Frequency of Communication with Friends and Family: More than three times a week    Frequency of Social Gatherings with Friends and Family: More than three times a week    Attends Religious Services: More than 4 times per year    Active Member of [employer] or Organizations: Yes    Attends Music therapist: More than 4 times per year    Marital Status: Never married  Intimate Partner Violence: Not At Risk (09/15/2021)   Humiliation, Afraid, Rape, and Kick questionnaire    Fear of Current or Ex-Partner: No    Emotionally Abused: No    Physically Abused: No    Sexually Abused: No    FAMILY HISTORY:  Family History  Problem Relation Age of Onset   Heart disease Mother    Congestive Heart Failure Mother    Diabetes Father    Aneurysm Sister    Diabetes Paternal Uncle    Diabetes Paternal Grandmother    Colon cancer Neg Hx    Colon polyps Neg Hx     CURRENT MEDICATIONS:  Outpatient Encounter Medications as of 11/27/2022  Medication Sig Note   acetaminophen (TYLENOL) 325 MG tablet Take 2 tablets (650 mg total) by mouth every 6 (six) hours as needed for moderate pain or  headache.    aspirin EC 81 MG tablet Take 81 mg by mouth every evening.    ferrous sulfate 325 (65 FE) MG tablet Take 325 mg by mouth daily with breakfast.    gabapentin (NEURONTIN) 100 MG capsule Take 2 capsules (200 mg total) by mouth at bedtime. (Patient taking differently: Take 200 mg by mouth 2 (two) times daily.)    glucose blood test strip 1 each by Other route 2 (two) times daily. Use to check blood glucose once daily at varying times.  Dispense Verio One Touch Test strips    hydrOXYzine (ATARAX/VISTARIL) 25 MG tablet Take 25 mg by mouth as needed. Cuts in half as needed    irbesartan (AVAPRO) 150 MG tablet Take 1 tablet (150 mg total) by mouth daily.    metFORMIN (GLUCOPHAGE) 1000 MG tablet Take 1 tablet (1,000 mg total) by mouth 2 (  two) times daily with a meal.    Multiple Vitamins-Minerals (MULTIVITAMIN WITH MINERALS) tablet Take 1 tablet by mouth daily.    ONETOUCH DELICA LANCETS 89V MISC Use to check blood glucose once daily at varying times    Probiotic Product (PROBIOTIC PO) Take 1 capsule by mouth 3 (three) times a week.     rosuvastatin (CRESTOR) 5 MG tablet Take 1 tablet (5 mg total) by mouth at bedtime.    Semaglutide, 1 MG/DOSE, 4 MG/3ML SOPN Inject 1 mg as directed once a week. 06/25/2022: Via [employer] [facility] patient assistance program     tamsulosin (FLOMAX) 0.4 MG CAPS capsule Take 1 capsule (0.4 mg total) by mouth daily after supper.    traZODone (DESYREL) 50 MG tablet Take 0.5-1 tablets (25-50 mg total) by mouth at bedtime as needed for sleep (For insomnia as needed).    No facility-administered encounter medications on file as of 11/27/2022.    ALLERGIES:  Allergies  Allergen Reactions   Sulfa Antibiotics     rash   Ramipril Cough     PHYSICAL EXAM:  ECOG PERFORMANCE STATUS: 1 - Symptomatic but completely ambulatory    There were no vitals filed for this visit. There were no vitals filed for this visit. Physical Exam Constitutional:      Appearance: Normal  appearance. He is obese.  HENT:     Head: Normocephalic and atraumatic.     Mouth/Throat:     Mouth: Mucous membranes are moist.  Eyes:     Extraocular Movements: Extraocular movements intact.     Pupils: Pupils are equal, round, and reactive to light.  Cardiovascular:     Rate and Rhythm: Normal rate and regular rhythm.     Pulses: Normal pulses.     Heart sounds: Normal heart sounds.  Pulmonary:     Effort: Pulmonary effort is normal.     Breath sounds: Normal breath sounds.  Abdominal:     General: Bowel sounds are normal.     Palpations: Abdomen is soft.     Tenderness: There is no abdominal tenderness.  Musculoskeletal:        General: No swelling.     Right lower leg: No edema.     Left lower leg: No edema.  Lymphadenopathy:     Cervical: No cervical adenopathy.  Skin:    General: Skin is warm and dry.  Neurological:     General: No focal deficit present.     Mental Status: He is alert and oriented to person, place, and time.  Psychiatric:        Mood and Affect: Mood normal.        Behavior: Behavior normal.      LABORATORY DATA:  I have reviewed the labs as listed.  CBC    Component Value Date/Time   WBC 6.3 10/01/2022 1240   RBC 5.34 10/01/2022 1240   HGB 16.6 10/01/2022 1240   HGB 16.7 05/21/2022 0806   HCT 48.2 10/01/2022 1240   HCT 49.5 05/21/2022 0806   PLT 243 10/01/2022 1240   PLT 220 05/21/2022 0806   MCV 90.3 10/01/2022 1240   MCV 92 05/21/2022 0806   MCH 31.1 10/01/2022 1240   MCHC 34.4 10/01/2022 1240   RDW 13.2 10/01/2022 1240   RDW 13.2 05/21/2022 0806   LYMPHSABS 1.4 10/01/2022 1240   LYMPHSABS 1.3 05/21/2022 0806   MONOABS 0.5 10/01/2022 1240   EOSABS 0.1 10/01/2022 1240   EOSABS 0.2 05/21/2022 0806   BASOSABS 0.1  10/01/2022 1240   BASOSABS 0.1 05/21/2022 0806      Latest Ref Rng & Units 05/21/2022    8:06 AM 11/05/2021    8:01 AM 07/24/2021    8:19 AM  CMP  Glucose 70 - 99 mg/dL 167  502  136   BUN 8 - 27 mg/dL 19  32  27    Creatinine 0.76 - 1.27 mg/dL 1.16  1.38  1.22   Sodium 134 - 144 mmol/L 138  132  138   Potassium 3.5 - 5.2 mmol/L 4.2  4.5  5.0   Chloride 96 - 106 mmol/L 100  92  102   CO2 20 - 29 mmol/L _0 Calcium 8.6 - 10.2 mg/dL 9.4  9.9  9.6   Total Protein 6.0 - 8.5 g/dL 7.1  7.2  7.4   Total Bilirubin 0.0 - 1.2 mg/dL 1.0  0.5  0.7   Alkaline Phos 44 - 121 IU/L 79  126  74   AST 0 - 40 IU/L 34  16  25   ALT 0 - 44 IU/L _1 DIAGNOSTIC IMAGING:  I have independently reviewed the relevant imaging and discussed with the patient.  ASSESSMENT & PLAN: 1.  Secondary polycythemia (JAK2 negative): - Patient tested NEGATIVE for JAK2 mutations, CALR, and MPL. - Erythropoietin level was normal at 10 - Other work-up was normal (BCR/ABL, HIV, SPEP, ANA, CRP, RF) - He has sleep apnea and uses CPAP machine.  Lifelong non-smoker. - He started phlebotomies in February 2020, averaging phlebotomy every 6 to 8 weeks. - Most recent phlebotomy on 10/01/2022 - Reports excessive heat, night sweats, and thirst when his Hgb/HCT is elevated.    - Denies aquagenic pruritus or other vasomotor symptoms.     - Patient reports that he feels better when HCT < 40 - Differential diagnosis favors secondary polycythemia in the setting of OSA, but it is also possible that he may have triple negative primary polycythemia - Labs today (11/27/2022): Normal CBC with Hgb 15.5 and HCT 46 point - PLAN: Proceed with phlebotomy today.  Continue CBC and phlebotomy every 8 weeks if HCT >40.  Office visit in 24 weeks. - Would consider bone marrow biopsy if he had any major changes in his blood counts.   2.  Sleep apnea - Patient reports compliance with CPAP nightly - PLAN: Continue CPAP   3.  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 - Significant iron deficiency noted in January 2023 with ferritin 7, iron saturation 7%,  and TIBC 464 - He has been taking ferrous sulfate 325 mg daily since January 2023 - Most recent iron panel (11/27/2022) with ferritin 17, iron saturation 17% - PLAN: Continue with phlebotomy for polycythemia as above, will periodically check iron. - Mnagement of iron deficiency in the setting of secondary polycythemia is poorly defined, but it is reasonable to consider combination of phlebotomy in addition to oral iron therapy in order to improve iron stores while safely maintaining an asymptomatic hematocrit level. - Continue taking ferrous sulfate 325 mg OTC daily. - We will recheck iron panel at his follow-up visit in 24 weeks.   4.   Health Maintenance & Other: - Patient had a colonoscopy on 02/07/2019 that showed a 5 mm polyp in the ascending colon that showed tubular adenoma.  Diverticulosis in the rectosigmoid colon and the  ascending colon.  Torturous colon.  External and internal hemorrhoids. - Elevated PSA:  Patient has a PSA of 9.2, followed by [facility].  Prostate biopsy on 03/30/2017 with 12 cores submitted that were benign. - Positive hep B serology:  Patient has positive hep B surface antibody and positive hep B core antibody. Surface antigen and IgM are negative.  Hep C and HIV are negative. Patient is immune based on prior exposure.   PLAN SUMMARY: >> CBC + phlebotomy every 8 weeks (proceed with phlebotomy if HCT >40) >> Same-day labs (CBC/D, ferritin, iron/TIBC) + office visit + phlebotomy in 6 months   All questions were answered. The patient knows to call the clinic with any problems, questions or concerns.  Medical decision making: Low  Time spent on visit: I spent 15 minutes counseling the patient face to face. The total time spent in the appointment was 22 minutes and more than 50% was on counseling.   Harriett Rush, PA-C  11/27/2022 10:16 AM

## 2022-11-27 ENCOUNTER — Encounter: Payer: Self-pay | Admitting: Pharmacist

## 2022-11-27 ENCOUNTER — Inpatient Hospital Stay: Payer: Medicare Other

## 2022-11-27 ENCOUNTER — Telehealth: Payer: Self-pay | Admitting: Family Medicine

## 2022-11-27 ENCOUNTER — Inpatient Hospital Stay: Payer: Medicare Other | Admitting: Physician Assistant

## 2022-11-27 ENCOUNTER — Other Ambulatory Visit: Payer: Self-pay

## 2022-11-27 ENCOUNTER — Inpatient Hospital Stay: Payer: Medicare Other | Attending: Hematology

## 2022-11-27 VITALS — BP 118/68 | HR 65 | Resp 18

## 2022-11-27 VITALS — BP 143/81 | HR 64 | Temp 97.9°F | Resp 17 | Ht 70.0 in | Wt 246.0 lb

## 2022-11-27 DIAGNOSIS — K219 Gastro-esophageal reflux disease without esophagitis: Secondary | ICD-10-CM | POA: Diagnosis not present

## 2022-11-27 DIAGNOSIS — Z7982 Long term (current) use of aspirin: Secondary | ICD-10-CM | POA: Diagnosis not present

## 2022-11-27 DIAGNOSIS — Z148 Genetic carrier of other disease: Secondary | ICD-10-CM

## 2022-11-27 DIAGNOSIS — Z79899 Other long term (current) drug therapy: Secondary | ICD-10-CM | POA: Diagnosis not present

## 2022-11-27 DIAGNOSIS — I10 Essential (primary) hypertension: Secondary | ICD-10-CM | POA: Insufficient documentation

## 2022-11-27 DIAGNOSIS — E669 Obesity, unspecified: Secondary | ICD-10-CM

## 2022-11-27 DIAGNOSIS — G4730 Sleep apnea, unspecified: Secondary | ICD-10-CM | POA: Diagnosis not present

## 2022-11-27 DIAGNOSIS — D751 Secondary polycythemia: Secondary | ICD-10-CM | POA: Diagnosis not present

## 2022-11-27 DIAGNOSIS — E119 Type 2 diabetes mellitus without complications: Secondary | ICD-10-CM | POA: Insufficient documentation

## 2022-11-27 DIAGNOSIS — Z7984 Long term (current) use of oral hypoglycemic drugs: Secondary | ICD-10-CM | POA: Diagnosis not present

## 2022-11-27 LAB — CBC WITH DIFFERENTIAL/PLATELET
Abs Immature Granulocytes: 0.04 10*3/uL (ref 0.00–0.07)
Basophils Absolute: 0.1 10*3/uL (ref 0.0–0.1)
Basophils Relative: 1 %
Eosinophils Absolute: 0.2 10*3/uL (ref 0.0–0.5)
Eosinophils Relative: 3 %
HCT: 46.1 % (ref 39.0–52.0)
Hemoglobin: 15.5 g/dL (ref 13.0–17.0)
Immature Granulocytes: 1 %
Lymphocytes Relative: 14 %
Lymphs Abs: 1.1 10*3/uL (ref 0.7–4.0)
MCH: 29.8 pg (ref 26.0–34.0)
MCHC: 33.6 g/dL (ref 30.0–36.0)
MCV: 88.5 fL (ref 80.0–100.0)
Monocytes Absolute: 0.5 10*3/uL (ref 0.1–1.0)
Monocytes Relative: 6 %
Neutro Abs: 6.3 10*3/uL (ref 1.7–7.7)
Neutrophils Relative %: 75 %
Platelets: 258 10*3/uL (ref 150–400)
RBC: 5.21 MIL/uL (ref 4.22–5.81)
RDW: 13.3 % (ref 11.5–15.5)
WBC: 8.3 10*3/uL (ref 4.0–10.5)
nRBC: 0 % (ref 0.0–0.2)

## 2022-11-27 LAB — IRON AND TIBC
Iron: 64 ug/dL (ref 45–182)
Saturation Ratios: 17 % — ABNORMAL LOW (ref 17.9–39.5)
TIBC: 387 ug/dL (ref 250–450)
UIBC: 323 ug/dL

## 2022-11-27 LAB — FERRITIN: Ferritin: 17 ng/mL — ABNORMAL LOW (ref 24–336)

## 2022-11-27 NOTE — Patient Instructions (Signed)
[facility]  Discharge Instructions: Thank you for choosing [facility] to provide your oncology and hematology care.  If you have a lab appointment with the [facility], please come in thru the Main Entrance and check in at the main information desk.  Wear comfortable clothing and clothing appropriate for easy access to any Portacath or PICC line.   We strive to give you quality time with your provider. You may need to reschedule your appointment if you arrive late (15 or more minutes).  Arriving late affects you and other patients whose appointments are after yours.  Also, if you miss three or more appointments without notifying the office, you may be dismissed from the clinic at the provider's discretion.      For prescription refill requests, have your pharmacy contact our office and allow 72 hours for refills to be completed.    Today you received the following chemotherapy and/or immunotherapy agents therapeutic phlebotomy     BELOW ARE SYMPTOMS THAT SHOULD BE REPORTED IMMEDIATELY: *FEVER GREATER THAN 100.4 F (38 C) OR HIGHER *CHILLS OR SWEATING *NAUSEA AND VOMITING THAT IS NOT CONTROLLED WITH YOUR NAUSEA MEDICATION *UNUSUAL SHORTNESS OF BREATH *UNUSUAL BRUISING OR BLEEDING *URINARY PROBLEMS (pain or burning when urinating, or frequent urination) *BOWEL PROBLEMS (unusual diarrhea, constipation, pain near the anus) TENDERNESS IN MOUTH AND THROAT WITH OR WITHOUT PRESENCE OF ULCERS (sore throat, sores in mouth, or a toothache) UNUSUAL RASH, SWELLING OR PAIN  UNUSUAL VAGINAL DISCHARGE OR ITCHING   Items with * indicate a potential emergency and should be followed up as soon as possible or go to the Emergency Department if any problems should occur.  Please show the CHEMOTHERAPY ALERT CARD or IMMUNOTHERAPY ALERT CARD at check-in to the Emergency Department and triage nurse.  Should you have questions after your visit or need to cancel or reschedule  your appointment, please contact [facility] [phone]  and follow the prompts.  Office hours are 8:00 a.m. to 4:30 p.m. Monday - Friday. Please note that voicemails left after 4:00 p.m. may not be returned until the following business day.  We are closed weekends and major holidays. You have access to a nurse at all times for urgent questions. Please call the main number to the clinic [phone] and follow the prompts.  For any non-urgent questions, you may also contact your provider using MyChart. We now offer e-Visits for anyone 63 and older to request care online for non-urgent symptoms. For details visit [email or web address].   Also download the MyChart app! Go to the app store, search "MyChart", open the app, select [facility], and log in with your MyChart username and password.  Masks are optional in the cancer centers. If you would like for your care team to wear a mask while they are taking care of you, please let them know. You may have one support person who is at least 70 years old accompany you for your appointments.

## 2022-11-27 NOTE — Patient Instructions (Signed)
[name] at [facility] Discharge Instructions  You were seen today by Tarri Abernethy PA-C for your elevated blood counts.  We will schedule you for labs and possible phlebotomy every 8 weeks.    You should CONTINUE taking iron pills daily due to your low iron.  We will check your iron levels again at your next office visit.  LABS: CBC and possible phlebotomy every 8 weeks   MEDICATIONS: Daily iron tablet (ferrous sulfate)  FOLLOW-UP APPOINTMENT: Office visit in about 6 months   - - - - - - - - - - - - - - - - - -    Thank you for choosing [facility] at [facility] to provide your oncology and hematology care.  To afford each patient quality time with our provider, please arrive at least 15 minutes before your scheduled appointment time.   If you have a lab appointment with the [facility] please come in thru the Main Entrance and check in at the main information desk.  You need to re-schedule your appointment should you arrive 10 or more minutes late.  We strive to give you quality time with our providers, and arriving late affects you and other patients whose appointments are after yours.  Also, if you no show three or more times for appointments you may be dismissed from the clinic at the providers discretion.     Again, thank you for choosing [facility].  Our hope is that these requests will decrease the amount of time that you wait before being seen by our physicians.       _____________________________________________________________  Should you have questions after your visit to [facility], please contact our office at [phone] and follow the prompts.  Our office hours are 8:00 a.m. and 4:30 p.m. Monday - Friday.  Please note that voicemails left after 4:00 p.m. may not be returned until the following business day.  We are closed weekends and major holidays.  You do have access to a nurse 24-7,  just call the main number to the clinic [phone] and do not press any options, hold on the line and a nurse will answer the phone.    For prescription refill requests, have your pharmacy contact our office and allow 72 hours.    Due to Covid, you will need to wear a mask upon entering the hospital. If you do not have a mask, a mask will be given to you at the Main Entrance upon arrival. For doctor visits, patients may have 1 support person age 41 or older with them. For treatment visits, patients can not have anyone with them due to social distancing guidelines and our immunocompromised population.

## 2022-11-27 NOTE — Progress Notes (Signed)
Chronic Care Management Pharmacy Note  11/25/2022 Name:  [name] MRN:  [id number] DOB:  [birth year]  Summary:  Diabetes: Goal on Track (progressing): YES. Uncontrolled--A1C >14%-->8.4%-->7.6% (MUCH IMPROVED); GFR >60 Current treatment: Ozempic, METFORMIN 1G TWICE DAILY;  Increase to Ozempic '2mg'$  weekly Patient assistance supply arrived; previously on trulicity, but changed due to cost/patient assistance Denies personal and family history of Medullary thyroid cancer (MTC) Current glucose readings: fasting glucose: <120, 95 this morning, post prandial glucose: N/a Discussed meal planning options and Plate method for healthy eating PATIENT REPORTS HE HAS CUT OUT FAST FOOD, HE IS DRINKING MORE WATER, EATING LESS/HEALTHIER CHOICES (I/E SALADS) Avoid sugary drinks and desserts Incorporate balanced protein, non starchy veggies, 1 serving of carbohydrate with each meal Increase water intake Increase physical activity as able Current exercise: N/A; ENCOURAGED Recommended continue Ozempic, MAY ADD SGLT2 AT F/U AS WELL Assessed patient finances. Novo nordisk PATIENT ASSISTANCE  Hyperlipidemia:  Goal on Track (progressing): YES. controlled; current treatment:ROSUVASTATIN Medications previously tried: N/A  Current dietary patterns: ENCOURAGED [place] on DIETARY CHANGES; CONTINUE CURRENT THERAPY  Lipid Panel     Component Value Date/Time   CHOL 99 (L) 05/21/2022 0806   TRIG 93 05/21/2022 0806   HDL 35 (L) 05/21/2022 0806   CHOLHDL 2.8 05/21/2022 0806   CHOLHDL 4.1 09/20/2020 0921   VLDL 15 09/20/2020 0921   LDLCALC 46 05/21/2022 0806   LABVLDL 18 05/21/2022 0806    Patient Goals/Self-Care Activities patient will:  - take medications as prescribed as evidenced by patient report and record review check glucose DAILY, document, and provide at future appointments collaborate with provider on medication access solutions target a minimum of 150  minutes of moderate intensity exercise weekly engage in dietary modifications by HEART HEALTHY, HEALTHY PLATE METHOD FOR DIABETES   Subjective: [name] is an 71 y.o. year old male who is a primary patient of Dettinger, Fransisca Kaufmann, MD.  The patient was referred to the Chronic Care Management team for assistance with care management needs subsequent to provider initiation of CCM services and plan of care.    Engaged with patient by telephone for follow up visit in response to provider referral for CCM services.   Objective:  LABS:   Lab Results  Component Value Date   CREATININE 1.16 05/21/2022   CREATININE 1.38 (H) 11/05/2021   CREATININE 1.22 07/24/2021     Lab Results  Component Value Date   HGBA1C 7.6 (H) 05/21/2022         Component Value Date/Time   CHOL 99 (L) 05/21/2022 0806   TRIG 93 05/21/2022 0806   HDL 35 (L) 05/21/2022 0806   CHOLHDL 2.8 05/21/2022 0806   CHOLHDL 4.1 09/20/2020 0921   VLDL 15 09/20/2020 0921   LDLCALC 46 05/21/2022 0806     Clinical ASCVD: No   The ASCVD Risk score (Arnett DK, et al., 2019) failed to calculate for the following reasons:   The valid total cholesterol range is 130 to 320 mg/dL    Other: (CHADS2VASc if Afib, PHQ9 if depression, MMRC or CAT for COPD, ACT, DEXA)    BP Readings from Last 3 Encounters:  10/01/22 126/79  08/06/22 130/73  06/11/22 122/82      SDOH:  (Social Determinants of Health) assessments and interventions performed:    Allergies  Allergen Reactions   Sulfa Antibiotics     rash   Ramipril Cough    Medications Reviewed Today     Reviewed by Blanca Friend,  Royce Macadamia, [facility] (Pharmacist) on 11/27/22 at 0828  Med List Status: <None>   Medication Order Taking? Sig Documenting Provider Last Dose Status Informant  acetaminophen (TYLENOL) 325 MG tablet [id number] No Take 2 tablets (650 mg total) by mouth every 6 (six) hours as needed for moderate pain or headache. Jaynee Eagles, PA-C Taking Active   aspirin EC 81  MG tablet [id number] No Take 81 mg by mouth every evening. [provider] Taking Active Self  ferrous sulfate 325 (65 FE) MG tablet [id number] No Take 325 mg by mouth daily with breakfast. [provider] Taking Active   gabapentin (NEURONTIN) 100 MG capsule [id number] No Take 2 capsules (200 mg total) by mouth at bedtime.  Patient taking differently: Take 200 mg by mouth 2 (two) times daily.   Dettinger, Fransisca Kaufmann, MD Taking Active   glucose blood test strip [id number] No 1 each by Other route 2 (two) times daily. Use to check blood glucose once daily at varying times.  Dispense Verio One Touch Test strips Dettinger, Fransisca Kaufmann, MD Taking Active   hydrOXYzine (ATARAX/VISTARIL) 25 MG tablet [id number] No Take 25 mg by mouth as needed. Cuts in half as needed [provider] Taking Active   irbesartan (AVAPRO) 150 MG tablet [id number] No Take 1 tablet (150 mg total) by mouth daily. Dettinger, Fransisca Kaufmann, MD Taking Active   metFORMIN (GLUCOPHAGE) 1000 MG tablet [id number] No Take 1 tablet (1,000 mg total) by mouth 2 (two) times daily with a meal. Dettinger, Fransisca Kaufmann, MD Taking Active   Multiple Vitamins-Minerals (MULTIVITAMIN WITH MINERALS) tablet [id number] No Take 1 tablet by mouth daily. [provider] Taking Active   [facility] LANCETS 22V MISC [id number] No Use to check blood glucose once daily at varying times Eckard, Tammy, RPH-CPP Taking Active Self  Probiotic Product (PROBIOTIC PO) [id number] No Take 1 capsule by mouth 3 (three) times a week.  [provider] Taking Active Self  rosuvastatin (CRESTOR) 5 MG tablet [id number] No Take 1 tablet (5 mg total) by mouth at bedtime. Dettinger, Fransisca Kaufmann, MD Taking Active   Semaglutide, 1 MG/DOSE, 4 MG/3ML SOPN [id number] No Inject 1 mg as directed once a week. Dettinger, Fransisca Kaufmann, MD Taking Active            Med Note Parthenia Ames Jun 25, 2022  8:43 AM) Via novo nordisk patient assistance program     tamsulosin (FLOMAX) 0.4 MG CAPS capsule [id number] No Take 1 capsule (0.4 mg total) by mouth daily after supper. Dettinger, Fransisca Kaufmann, MD Taking Active   traZODone (DESYREL) 50 MG tablet [id number] No Take 0.5-1 tablets (25-50 mg total) by mouth at bedtime as needed for sleep (For insomnia as needed). Dettinger, Fransisca Kaufmann, MD Taking Active               Goals Addressed   None     Plan: Telephone follow up appointment with care management team member scheduled for:  3-6 months     Regina Eck, PharmD, BCPS, [place] Clinical Pharmacist, [place]  II  T [phone]

## 2022-11-27 NOTE — Patient Instructions (Signed)
Visit Information  Following are the goals we discussed today:  Current Barriers:  Unable to independently afford treatment regimen Unable to achieve control of T2DM  Suboptimal therapeutic regimen for T2DM  Pharmacist Clinical Goal(s):  patient will verbalize ability to afford treatment regimen achieve control of T2DM as evidenced by GOAL A1C<7%, IMPROVED GLYCEMIC CONTROL through collaboration with PharmD and provider.   Interventions: 1:1 collaboration with Dettinger, Fransisca Kaufmann, MD regarding development and update of comprehensive plan of care as evidenced by provider attestation and co-signature Inter-disciplinary care team collaboration (see longitudinal plan of care) Comprehensive medication review performed; medication list updated in electronic medical record  Diabetes: Goal on Track (progressing): YES. Uncontrolled--A1C >14%-->8.4%-->7.6% (MUCH IMPROVED); GFR >60 Current treatment: Ozempic, METFORMIN 1G TWICE DAILY;  Increase to Ozempic '2mg'$  weekly Patient assistance supply arrived; previously on trulicity, but changed due to cost/patient assistance Denies personal and family history of Medullary thyroid cancer (MTC) Current glucose readings: fasting glucose: <120, 95 this morning, post prandial glucose: N/a Discussed meal planning options and Plate method for healthy eating PATIENT REPORTS HE HAS CUT OUT FAST FOOD, HE IS DRINKING MORE WATER, EATING LESS/HEALTHIER CHOICES (I/E SALADS) Avoid sugary drinks and desserts Incorporate balanced protein, non starchy veggies, 1 serving of carbohydrate with each meal Increase water intake Increase physical activity as able Current exercise: N/A; ENCOURAGED Recommended continue Ozempic, MAY ADD SGLT2 AT F/U AS WELL Assessed patient finances. Novo nordisk PATIENT ASSISTANCE  Hyperlipidemia:  Goal on Track (progressing): YES. controlled; current treatment:ROSUVASTATIN Medications previously tried: N/A  Current dietary patterns:  ENCOURAGED [place] on DIETARY CHANGES; CONTINUE CURRENT THERAPY  Lipid Panel     Component Value Date/Time   CHOL 99 (L) 05/21/2022 0806   TRIG 93 05/21/2022 0806   HDL 35 (L) 05/21/2022 0806   CHOLHDL 2.8 05/21/2022 0806   CHOLHDL 4.1 09/20/2020 0921   VLDL 15 09/20/2020 0921   LDLCALC 46 05/21/2022 0806   LABVLDL 18 05/21/2022 0806    Patient Goals/Self-Care Activities patient will:  - take medications as prescribed as evidenced by patient report and record review check glucose DAILY, document, and provide at future appointments collaborate with provider on medication access solutions target a minimum of 150 minutes of moderate intensity exercise weekly engage in dietary modifications by HEART HEALTHY, HEALTHY PLATE METHOD FOR DIABETES   Plan: Telephone follow up appointment with care management team member scheduled for:  6 months  Signature Regina Eck, PharmD, BCPS, BCACP Clinical Pharmacist, [place]  II  T [phone]   Please call the care guide team at [phone] if you need to cancel or reschedule your appointment.   The patient verbalized understanding of instructions, educational materials, and care plan provided today and DECLINED offer to receive copy of patient instructions, educational materials, and care plan.

## 2022-11-27 NOTE — Telephone Encounter (Signed)
-----   Message from [place] sent at 11/23/2022 11:48 AM EST ----- Please review [facility] Quality team note.

## 2022-11-27 NOTE — Progress Notes (Signed)
[name] presents today for theraputic phlebotomy per MD orders. Last hgb 15.5 /hct 46.1 was on 11/27/22. VSS prior to procedure. Pt reports eating before arrival. Procedure started at 1011 using patients left AC. 500 mL of blood removed. Procedure ended at 1016. Gauze and coban applied to [facility], site clean and dry. VSS upon completion of procedure. Pt denies dizziness, lightheadedness, or feeling faint. Discharged in satisfactory condition with follow up instructions.

## 2022-12-03 ENCOUNTER — Other Ambulatory Visit: Payer: Self-pay | Admitting: Family Medicine

## 2022-12-03 DIAGNOSIS — E1159 Type 2 diabetes mellitus with other circulatory complications: Secondary | ICD-10-CM

## 2022-12-13 DIAGNOSIS — E1159 Type 2 diabetes mellitus with other circulatory complications: Secondary | ICD-10-CM | POA: Diagnosis not present

## 2022-12-13 DIAGNOSIS — I10 Essential (primary) hypertension: Secondary | ICD-10-CM | POA: Diagnosis not present

## 2023-01-04 ENCOUNTER — Encounter: Payer: Self-pay | Admitting: Family Medicine

## 2023-01-04 ENCOUNTER — Other Ambulatory Visit: Payer: Self-pay | Admitting: Family Medicine

## 2023-01-04 DIAGNOSIS — I152 Hypertension secondary to endocrine disorders: Secondary | ICD-10-CM

## 2023-01-04 NOTE — Telephone Encounter (Signed)
LMTCB TO SCHEDULE APPT LETTER MAILED 

## 2023-01-04 NOTE — Telephone Encounter (Signed)
Dettinger NTBS 30 days given 12/03/22

## 2023-01-19 ENCOUNTER — Other Ambulatory Visit: Payer: Self-pay

## 2023-01-19 ENCOUNTER — Encounter: Payer: Self-pay | Admitting: Family Medicine

## 2023-01-19 DIAGNOSIS — E1169 Type 2 diabetes mellitus with other specified complication: Secondary | ICD-10-CM

## 2023-01-19 DIAGNOSIS — E1159 Type 2 diabetes mellitus with other circulatory complications: Secondary | ICD-10-CM

## 2023-01-19 MED ORDER — IRBESARTAN 150 MG PO TABS: 150.0000 mg | ORAL_TABLET | Freq: Every day | ORAL | 0 refills | Status: DC

## 2023-01-28 ENCOUNTER — Inpatient Hospital Stay: Payer: Medicare Other | Attending: Hematology

## 2023-01-28 ENCOUNTER — Inpatient Hospital Stay: Payer: Medicare Other

## 2023-01-28 ENCOUNTER — Other Ambulatory Visit: Payer: Medicare Other

## 2023-01-28 DIAGNOSIS — Z148 Genetic carrier of other disease: Secondary | ICD-10-CM

## 2023-01-28 DIAGNOSIS — E1159 Type 2 diabetes mellitus with other circulatory complications: Secondary | ICD-10-CM

## 2023-01-28 DIAGNOSIS — D751 Secondary polycythemia: Secondary | ICD-10-CM

## 2023-01-28 DIAGNOSIS — E669 Obesity, unspecified: Secondary | ICD-10-CM

## 2023-01-28 DIAGNOSIS — I152 Hypertension secondary to endocrine disorders: Secondary | ICD-10-CM | POA: Diagnosis not present

## 2023-01-28 DIAGNOSIS — E1169 Type 2 diabetes mellitus with other specified complication: Secondary | ICD-10-CM | POA: Diagnosis not present

## 2023-01-28 LAB — CBC WITH DIFFERENTIAL/PLATELET
Abs Immature Granulocytes: 0.01 10*3/uL (ref 0.00–0.07)
Basophils Absolute: 0.1 10*3/uL (ref 0.0–0.1)
Basophils Relative: 1 %
Eosinophils Absolute: 0.2 10*3/uL (ref 0.0–0.5)
Eosinophils Relative: 3 %
HCT: 43.3 % (ref 39.0–52.0)
Hemoglobin: 13.9 g/dL (ref 13.0–17.0)
Immature Granulocytes: 0 %
Lymphocytes Relative: 27 %
Lymphs Abs: 1.3 10*3/uL (ref 0.7–4.0)
MCH: 28 pg (ref 26.0–34.0)
MCHC: 32.1 g/dL (ref 30.0–36.0)
MCV: 87.1 fL (ref 80.0–100.0)
Monocytes Absolute: 0.4 10*3/uL (ref 0.1–1.0)
Monocytes Relative: 9 %
Neutro Abs: 2.9 10*3/uL (ref 1.7–7.7)
Neutrophils Relative %: 60 %
Platelets: 237 10*3/uL (ref 150–400)
RBC: 4.97 MIL/uL (ref 4.22–5.81)
RDW: 13.2 % (ref 11.5–15.5)
WBC: 4.8 10*3/uL (ref 4.0–10.5)
nRBC: 0 % (ref 0.0–0.2)

## 2023-01-28 LAB — LIPID PANEL

## 2023-01-28 LAB — BAYER DCA HB A1C WAIVED: HB A1C (BAYER DCA - WAIVED): 8 % — ABNORMAL HIGH (ref 4.8–5.6)

## 2023-01-28 NOTE — Patient Instructions (Signed)
[facility]  Discharge Instructions: Thank you for choosing [facility] to provide your oncology and hematology care.  If you have a lab appointment with the Cancer Center, please come in thru the Main Entrance and check in at the main information desk.  Wear comfortable clothing and clothing appropriate for easy access to any Portacath or PICC line.   We strive to give you quality time with your provider. You may need to reschedule your appointment if you arrive late (15 or more minutes).  Arriving late affects you and other patients whose appointments are after yours.  Also, if you miss three or more appointments without notifying the office, you may be dismissed from the clinic at the provider's discretion.      For prescription refill requests, have your pharmacy contact our office and allow 72 hours for refills to be completed.    To help prevent nausea and vomiting after your treatment, we encourage you to take your nausea medication as directed.  BELOW ARE SYMPTOMS THAT SHOULD BE REPORTED IMMEDIATELY: *FEVER GREATER THAN 100.4 F (38 C) OR HIGHER *CHILLS OR SWEATING *NAUSEA AND VOMITING THAT IS NOT CONTROLLED WITH YOUR NAUSEA MEDICATION *UNUSUAL SHORTNESS OF BREATH *UNUSUAL BRUISING OR BLEEDING *URINARY PROBLEMS (pain or burning when urinating, or frequent urination) *BOWEL PROBLEMS (unusual diarrhea, constipation, pain near the anus) TENDERNESS IN MOUTH AND THROAT WITH OR WITHOUT PRESENCE OF ULCERS (sore throat, sores in mouth, or a toothache) UNUSUAL RASH, SWELLING OR PAIN  UNUSUAL VAGINAL DISCHARGE OR ITCHING   Items with * indicate a potential emergency and should be followed up as soon as possible or go to the Emergency Department if any problems should occur.  Please show the CHEMOTHERAPY ALERT CARD or IMMUNOTHERAPY ALERT CARD at check-in to the Emergency Department and triage nurse.  Should you have questions after your visit or need to  cancel or reschedule your appointment, please contact [facility] [phone]  and follow the prompts.  Office hours are 8:00 a.m. to 4:30 p.m. Monday - Friday. Please note that voicemails left after 4:00 p.m. may not be returned until the following business day.  We are closed weekends and major holidays. You have access to a nurse at all times for urgent questions. Please call the main number to the clinic [phone] and follow the prompts.  For any non-urgent questions, you may also contact your provider using MyChart. We now offer e-Visits for anyone 18 and older to request care online for non-urgent symptoms. For details visit [email or web address].   Also download the MyChart app! Go to the app store, search "MyChart", open the app, select [place], and log in with your MyChart username and password.   

## 2023-01-28 NOTE — Progress Notes (Signed)
Patient presents today for therapeutic phlebotomy.  Patient is in satisfactory condition with no complaints voiced.  Vital signs are stable.  Hemoglobin today is 13.9 and hematocrit is 43.3.  We will proceed with phlebotomy per provider orders.    Therapeutic phlebotomy started at 1434 and ended at 1439.  500 mL of blood was drawn from R arm.  Patient tolerated procedure well with no complaints voiced.  Vital signs remained stable.  Patient refused to wait the recommended 30 minute post phlebotomy wait time.  Patient left ambulatory in stable condition.

## 2023-01-29 LAB — LIPID PANEL
Chol/HDL Ratio: 2.6 ratio (ref 0.0–5.0)
Cholesterol, Total: 93 mg/dL — ABNORMAL LOW (ref 100–199)
HDL: 36 mg/dL — ABNORMAL LOW (ref >39)
LDL Chol Calc (NIH): 36 mg/dL (ref 0–99)
Triglycerides: 112 mg/dL (ref 0–149)
VLDL Cholesterol Cal: 21 mg/dL (ref 5–40)

## 2023-01-29 LAB — CMP14+EGFR
ALT: 21 IU/L (ref 0–44)
AST: 23 IU/L (ref 0–40)
Albumin/Globulin Ratio: 1.5 (ref 1.2–2.2)
Albumin: 4.1 g/dL (ref 3.9–4.9)
Alkaline Phosphatase: 82 IU/L (ref 44–121)
BUN/Creatinine Ratio: 15 (ref 10–24)
BUN: 17 mg/dL (ref 8–27)
Bilirubin Total: 0.6 mg/dL (ref 0.0–1.2)
CO2: 20 mmol/L (ref 20–29)
Calcium: 9.3 mg/dL (ref 8.6–10.2)
Chloride: 102 mmol/L (ref 96–106)
Creatinine, Ser: 1.13 mg/dL (ref 0.76–1.27)
Globulin, Total: 2.8 g/dL (ref 1.5–4.5)
Glucose: 144 mg/dL — ABNORMAL HIGH (ref 70–99)
Potassium: 4.4 mmol/L (ref 3.5–5.2)
Sodium: 138 mmol/L (ref 134–144)
Total Protein: 6.9 g/dL (ref 6.0–8.5)
eGFR: 70 mL/min/{1.73_m2} (ref >59)

## 2023-01-29 LAB — CBC WITH DIFFERENTIAL/PLATELET
Basophils Absolute: 0.1 10*3/uL (ref 0.0–0.2)
Basos: 1 %
EOS (ABSOLUTE): 0.1 10*3/uL (ref 0.0–0.4)
Eos: 3 %
Hematocrit: 45.1 % (ref 37.5–51.0)
Hemoglobin: 14.6 g/dL (ref 13.0–17.7)
Immature Grans (Abs): 0 10*3/uL (ref 0.0–0.1)
Immature Granulocytes: 0 %
Lymphocytes Absolute: 1.3 10*3/uL (ref 0.7–3.1)
Lymphs: 29 %
MCH: 28.1 pg (ref 26.6–33.0)
MCHC: 32.4 g/dL (ref 31.5–35.7)
MCV: 87 fL (ref 79–97)
Monocytes Absolute: 0.4 10*3/uL (ref 0.1–0.9)
Monocytes: 8 %
Neutrophils Absolute: 2.7 10*3/uL (ref 1.4–7.0)
Neutrophils: 59 %
Platelets: 248 10*3/uL (ref 150–450)
RBC: 5.2 x10E6/uL (ref 4.14–5.80)
RDW: 13.6 % (ref 11.6–15.4)
WBC: 4.5 10*3/uL (ref 3.4–10.8)

## 2023-02-04 ENCOUNTER — Ambulatory Visit: Payer: Medicare Other | Admitting: Family Medicine

## 2023-02-08 ENCOUNTER — Other Ambulatory Visit: Payer: Self-pay | Admitting: Family Medicine

## 2023-02-08 DIAGNOSIS — I152 Hypertension secondary to endocrine disorders: Secondary | ICD-10-CM

## 2023-02-12 ENCOUNTER — Encounter: Payer: Self-pay | Admitting: Family Medicine

## 2023-02-12 ENCOUNTER — Ambulatory Visit (INDEPENDENT_AMBULATORY_CARE_PROVIDER_SITE_OTHER): Payer: Medicare Other | Admitting: Family Medicine

## 2023-02-12 VITALS — BP 121/69 | HR 61 | Ht 70.0 in | Wt 251.0 lb

## 2023-02-12 DIAGNOSIS — E669 Obesity, unspecified: Secondary | ICD-10-CM

## 2023-02-12 DIAGNOSIS — I152 Hypertension secondary to endocrine disorders: Secondary | ICD-10-CM

## 2023-02-12 DIAGNOSIS — E1159 Type 2 diabetes mellitus with other circulatory complications: Secondary | ICD-10-CM | POA: Diagnosis not present

## 2023-02-12 DIAGNOSIS — E1169 Type 2 diabetes mellitus with other specified complication: Secondary | ICD-10-CM | POA: Diagnosis not present

## 2023-02-12 DIAGNOSIS — G4709 Other insomnia: Secondary | ICD-10-CM

## 2023-02-12 DIAGNOSIS — R972 Elevated prostate specific antigen [PSA]: Secondary | ICD-10-CM

## 2023-02-12 DIAGNOSIS — Z6836 Body mass index (BMI) 36.0-36.9, adult: Secondary | ICD-10-CM

## 2023-02-12 MED ORDER — GABAPENTIN 100 MG PO CAPS: 200.0000 mg | ORAL_CAPSULE | Freq: Every day | ORAL | 3 refills | Status: DC

## 2023-02-12 MED ORDER — TAMSULOSIN HCL 0.4 MG PO CAPS: 0.4000 mg | ORAL_CAPSULE | Freq: Every day | ORAL | 3 refills | Status: DC

## 2023-02-12 MED ORDER — METFORMIN HCL 1000 MG PO TABS: 1000.0000 mg | ORAL_TABLET | Freq: Two times a day (BID) | ORAL | 3 refills | Status: DC

## 2023-02-12 MED ORDER — IRBESARTAN 150 MG PO TABS: 150.0000 mg | ORAL_TABLET | Freq: Every day | ORAL | 0 refills | Status: DC

## 2023-02-12 MED ORDER — HYDROXYZINE HCL 25 MG PO TABS: 25.0000 mg | ORAL_TABLET | ORAL | 3 refills | Status: DC | PRN

## 2023-02-12 MED ORDER — TRAZODONE HCL 50 MG PO TABS: 25.0000 mg | ORAL_TABLET | Freq: Every evening | ORAL | 3 refills | Status: DC | PRN

## 2023-02-12 MED ORDER — SEMAGLUTIDE (2 MG/DOSE) 8 MG/3ML ~~LOC~~ SOPN: 2.0000 mg | PEN_INJECTOR | SUBCUTANEOUS | 3 refills | Status: DC

## 2023-02-12 NOTE — Progress Notes (Signed)
BP 121/69   Pulse 61   Ht '5\' 10"'$  (1.778 m)   Wt 113.9 kg   SpO2 96%   BMI 36.01 kg/m    Subjective:   Patient ID: [name], male    DOB: 1951-10-10, 72 y.o.   MRN: [id number]  HPI: [name] is a 71 y.o. male presenting on 02/12/2023 for Medical Management of Chronic Issues, Diabetes, Hyperlipidemia, and Hypertension  Type 2 diabetes mellitus Patient comes in today for recheck of his diabetes. Patient has been currently taking Metformin 1000 mg PO BID and Semaglutide 1 mg injection once weekly. Patient is currently on an ACE inhibitor/ARB. Patient has not seen an ophthalmologist this year. Patient denies any issues with their feet. The symptom started onset as an adult. Hypertension and hyperlipidemia ARE RELATED TO DM.   Hypertension Patient is currently on Irbesartan, and their blood pressure today is 121/69. Patient denies any lightheadedness or dizziness. Patient denies headaches, blurred vision, chest pains, shortness of breath, or weakness. Denies any side effects from medication and is content with current medication.   Hyperlipidemia Patient is coming in for recheck of his hyperlipidemia. The patient is currently taking Rosuvastatin. They deny any issues with myalgias or history of liver damage from it. They deny any focal numbness or weakness or chest pain.   Patient also inquired about a flesh-colored nevus on his posterior neck that he is concerned about.  Relevant past medical, surgical, family and social history reviewed and updated as indicated. Interim medical history since our last visit reviewed. Allergies and medications reviewed and updated.  Review of Systems  Constitutional:  Negative for fatigue and fever.  Eyes:  Negative for visual disturbance.  Respiratory:  Negative for chest tightness and shortness of breath.   Cardiovascular:  Negative for chest pain and leg swelling.  Gastrointestinal:  Negative for abdominal pain, constipation, diarrhea,  nausea and vomiting.  Endocrine: Negative for polydipsia, polyphagia and polyuria.  Genitourinary:  Negative for flank pain.  Musculoskeletal:  Negative for myalgias and neck pain.  Neurological:  Negative for dizziness, weakness, light-headedness, numbness and headaches.  Psychiatric/Behavioral:  Negative for behavioral problems and confusion.   All other systems reviewed and are negative.   Per HPI unless specifically indicated above   Allergies as of 02/12/2023       Reactions   Sulfa Antibiotics    rash   Ramipril Cough        Medication List        Accurate as of February 12, 2023  8:58 AM. If you have any questions, ask your nurse or doctor.          STOP taking these medications    Semaglutide (1 MG/DOSE) 4 MG/3ML Sopn Replaced by: Semaglutide (2 MG/DOSE) 8 MG/3ML Sopn Stopped by: Fransisca Kaufmann Emrik Erhard, MD       TAKE these medications    acetaminophen 325 MG tablet Commonly known as: Tylenol Take 2 tablets (650 mg total) by mouth every 6 (six) hours as needed for moderate pain or headache.   aspirin EC 81 MG tablet Take 81 mg by mouth every evening.   ferrous sulfate 325 (65 FE) MG tablet Take 325 mg by mouth daily with breakfast.   gabapentin 100 MG capsule Commonly known as: NEURONTIN Take 2 capsules (200 mg total) by mouth at bedtime. What changed: when to take this   glucose blood test strip 1 each by Other route 2 (two) times daily. Use to check blood glucose once daily  at varying times.  Dispense Verio One Touch Test strips   hydrOXYzine 25 MG tablet Commonly known as: ATARAX Take 1 tablet (25 mg total) by mouth as needed. Cuts in half as needed   irbesartan 150 MG tablet Commonly known as: AVAPRO Take 1 tablet (150 mg total) by mouth daily. What changed: additional instructions Changed by: Fransisca Kaufmann Raizy Auzenne, MD   metFORMIN 1000 MG tablet Commonly known as: GLUCOPHAGE Take 1 tablet (1,000 mg total) by mouth 2 (two) times daily with a  meal.   multivitamin with minerals tablet Take 1 tablet by mouth daily.   OneTouch [place] Lancets [id number] Misc Use to check blood glucose once daily at varying times   PROBIOTIC PO Take 1 capsule by mouth 3 (three) times a week.   rosuvastatin 5 MG tablet Commonly known as: Crestor Take 1 tablet (5 mg total) by mouth at bedtime.   Semaglutide (2 MG/DOSE) 8 MG/3ML Sopn Inject 2 mg as directed once a week. Replaces: Semaglutide (1 MG/DOSE) 4 MG/3ML Sopn Started by: Fransisca Kaufmann Ronisha Herringshaw, MD   tamsulosin 0.4 MG Caps capsule Commonly known as: FLOMAX Take 1 capsule (0.4 mg total) by mouth daily after supper.   traZODone 50 MG tablet Commonly known as: DESYREL Take 0.5-1 tablets (25-50 mg total) by mouth at bedtime as needed for sleep (For insomnia as needed).         Objective:   BP 121/69   Pulse 61   Ht '5\' 10"'$  (1.778 m)   Wt 113.9 kg   SpO2 96%   BMI 36.01 kg/m   Wt Readings from Last 3 Encounters:  02/12/23 113.9 kg  11/27/22 111.6 kg  06/11/22 113.2 kg    Physical Exam Vitals reviewed.  Constitutional:      Appearance: Normal appearance.  Neck:      Comments: 0.5 cm flesh-colored nevus present on posterior left neck. No central ulceration or erythema.  Cardiovascular:     Rate and Rhythm: Normal rate.     Pulses: Normal pulses.     Heart sounds: Normal heart sounds.  Pulmonary:     Effort: Pulmonary effort is normal.     Breath sounds: Normal breath sounds. No wheezing, rhonchi or rales.  Abdominal:     Palpations: Abdomen is soft.     Tenderness: There is no right CVA tenderness, left CVA tenderness or guarding.  Musculoskeletal:     Cervical back: Neck supple. No edema, erythema, signs of trauma or tenderness.     Right lower leg: No edema.     Left lower leg: No edema.  Lymphadenopathy:     Cervical: No cervical adenopathy.     Right cervical: No posterior cervical adenopathy.    Left cervical: No posterior cervical adenopathy.  Skin:    General:  Skin is warm and dry.  Neurological:     Mental Status: He is alert.     Sensory: No sensory deficit.  Psychiatric:        Mood and Affect: Mood normal.        Behavior: Behavior normal.       Assessment & Plan:   Problem List Items Addressed This Visit       Cardiovascular and Mediastinum   Hypertension associated with diabetes ([place])   Relevant Medications   metFORMIN (GLUCOPHAGE) 1000 MG tablet   irbesartan (AVAPRO) 150 MG tablet   Semaglutide, 2 MG/DOSE, 8 MG/3ML SOPN     Endocrine   Diabetes mellitus type 2 in obese ([place]) -  Primary   Relevant Medications   metFORMIN (GLUCOPHAGE) 1000 MG tablet   irbesartan (AVAPRO) 150 MG tablet   gabapentin (NEURONTIN) 100 MG capsule   hydrOXYzine (ATARAX) 25 MG tablet   Semaglutide, 2 MG/DOSE, 8 MG/3ML SOPN     Other   Elevated PSA   Relevant Medications   tamsulosin (FLOMAX) 0.4 MG CAPS capsule   Other Visit Diagnoses     Other insomnia       Relevant Medications   traZODone (DESYREL) 50 MG tablet       HbA1c is 8.0 today, elevated 0.4 since last visit. Patient is going to reduce carbohydrate intake and Semaglutide dose is being increased to 2 mg injection once per week.  Continue all other medications as prescribed. Patient will make a separate appointment for removal of skin nevus.  Follow up plan: Return in about 3 months (around 05/15/2023), or if symptoms worsen or fail to improve, for Diabetes and hypertension and cholesterol.  Counseling provided for all of the vaccine components No orders of the defined types were placed in this encounter.  Summer Suncook, PA-S2 [facility] 02/12/2023, 9:10 AM  Patient seen and examined with PA student, agree with assessment and plan above.  He says since the nevus is irritating him at his neckline we will go ahead and remove it.  Not concerning for cancer based on visualization.  Focus on diet.  We also will increase Ozempic to 2 mg Caryl Pina, MD [facility] 02/12/2023, 8:58 AM

## 2023-02-25 ENCOUNTER — Other Ambulatory Visit (HOSPITAL_COMMUNITY)
Admission: RE | Admit: 2023-02-25 | Discharge: 2023-02-25 | Disposition: A | Discharge status: Home / Self Care | Payer: Medicare Other | Source: Ambulatory Visit | Attending: Family Medicine | Admitting: Family Medicine

## 2023-02-25 ENCOUNTER — Ambulatory Visit (INDEPENDENT_AMBULATORY_CARE_PROVIDER_SITE_OTHER): Payer: Medicare Other | Admitting: Family Medicine

## 2023-02-25 ENCOUNTER — Encounter: Payer: Self-pay | Admitting: Family Medicine

## 2023-02-25 VITALS — BP 109/65 | HR 67 | Ht 70.0 in | Wt 251.0 lb

## 2023-02-25 DIAGNOSIS — D3612 Benign neoplasm of peripheral nerves and autonomic nervous system, upper limb, including shoulder: Secondary | ICD-10-CM | POA: Diagnosis not present

## 2023-02-25 DIAGNOSIS — D229 Melanocytic nevi, unspecified: Secondary | ICD-10-CM | POA: Insufficient documentation

## 2023-02-25 DIAGNOSIS — L918 Other hypertrophic disorders of the skin: Secondary | ICD-10-CM

## 2023-02-25 DIAGNOSIS — D2261 Melanocytic nevi of right upper limb, including shoulder: Secondary | ICD-10-CM

## 2023-02-25 DIAGNOSIS — D225 Melanocytic nevi of trunk: Secondary | ICD-10-CM | POA: Diagnosis not present

## 2023-02-25 DIAGNOSIS — D2361 Other benign neoplasm of skin of right upper limb, including shoulder: Secondary | ICD-10-CM | POA: Diagnosis not present

## 2023-02-25 NOTE — Progress Notes (Signed)
BP 109/65   Pulse 67   Ht '5\' 10"'$  (1.778 m)   Wt 251 lb (113.9 kg)   SpO2 96%   BMI 36.01 kg/m    Subjective:   Patient ID: [name], male    DOB: 1951/05/26, 72 y.o.   MRN: [id number]  HPI: [name] is a 72 y.o. male presenting on 02/25/2023 for skin excision   HPI Skin lesions, atypical nevus Patient has 3 skin lesions, 1 on his right upper shoulder and 1 on his left mid axillary line overlying the lowest rib.  He has another skin tag on the right lateral abdomen.  He says is been bothering him the catch and he is just ready to get rid of them.  Relevant past medical, surgical, family and social history reviewed and updated as indicated. Interim medical history since our last visit reviewed. Allergies and medications reviewed and updated.  Review of Systems  Constitutional:  Negative for chills and fever.  Skin:  Negative for color change, rash and wound.    Per HPI unless specifically indicated above   Allergies as of 02/25/2023       Reactions   Sulfa Antibiotics    rash   Ramipril Cough        Medication List        Accurate as of February 25, 2023  2:42 PM. If you have any questions, ask your nurse or doctor.          acetaminophen 325 MG tablet Commonly known as: Tylenol Take 2 tablets (650 mg total) by mouth every 6 (six) hours as needed for moderate pain or headache.   aspirin EC 81 MG tablet Take 81 mg by mouth every evening.   ferrous sulfate 325 (65 FE) MG tablet Take 325 mg by mouth daily with breakfast.   gabapentin 100 MG capsule Commonly known as: NEURONTIN Take 2 capsules (200 mg total) by mouth at bedtime.   glucose blood test strip 1 each by Other route 2 (two) times daily. Use to check blood glucose once daily at varying times.  Dispense Verio One Touch Test strips   hydrOXYzine 25 MG tablet Commonly known as: ATARAX Take 1 tablet (25 mg total) by mouth as needed. Cuts in half as needed   irbesartan 150 MG  tablet Commonly known as: AVAPRO Take 1 tablet (150 mg total) by mouth daily.   metFORMIN 1000 MG tablet Commonly known as: GLUCOPHAGE Take 1 tablet (1,000 mg total) by mouth 2 (two) times daily with a meal.   multivitamin with minerals tablet Take 1 tablet by mouth daily.   OneTouch Delica Lancets 99991111 Misc Use to check blood glucose once daily at varying times   PROBIOTIC PO Take 1 capsule by mouth 3 (three) times a week.   rosuvastatin 5 MG tablet Commonly known as: Crestor Take 1 tablet (5 mg total) by mouth at bedtime.   Semaglutide (2 MG/DOSE) 8 MG/3ML Sopn Inject 2 mg as directed once a week.   tamsulosin 0.4 MG Caps capsule Commonly known as: FLOMAX Take 1 capsule (0.4 mg total) by mouth daily after supper.   traZODone 50 MG tablet Commonly known as: DESYREL Take 0.5-1 tablets (25-50 mg total) by mouth at bedtime as needed for sleep (For insomnia as needed).         Objective:   BP 109/65   Pulse 67   Ht '5\' 10"'$  (1.778 m)   Wt 251 lb (113.9 kg)   SpO2 96%  BMI 36.01 kg/m   Wt Readings from Last 3 Encounters:  02/25/23 251 lb (113.9 kg)  02/12/23 251 lb (113.9 kg)  11/27/22 246 lb (111.6 kg)    Physical Exam Vitals and nursing note reviewed.  Constitutional:      Appearance: Normal appearance.  Skin:    General: Skin is warm and dry.     Findings: Lesion (First lesion on right shoulder, flesh-colored nevus second lesion on left abdomen, flesh nevus) present.     Comments: Third lesion is a skin tag on the right lateral abdomen  Neurological:     Mental Status: He is alert.    Skin lesion x3 removal: Verbal consent was obtained.  Betadine was used for cleansing.  3cc of 2% lidocaine without epinephrine was used for anesthesia.  Shave biopsy was performed with good margins.  Compression bandage was used to achieve hemostasis.  Procedure was tolerated well.  Bleeding was minimal    Assessment & Plan:   Problem List Items Addressed This Visit    None Visit Diagnoses     Fleshy nevus of skin    -  Primary   Relevant Orders   Surgical pathology   Surgical pathology   Inflamed skin tag            Follow up plan: Return if symptoms worsen or fail to improve.  Counseling provided for all of the vaccine components No orders of the defined types were placed in this encounter.   Caryl Pina, MD [facility] 02/25/2023, 2:42 PM

## 2023-03-02 LAB — SURGICAL PATHOLOGY

## 2023-03-13 DIAGNOSIS — G4733 Obstructive sleep apnea (adult) (pediatric): Secondary | ICD-10-CM | POA: Diagnosis not present

## 2023-03-30 ENCOUNTER — Other Ambulatory Visit: Payer: Self-pay

## 2023-03-30 ENCOUNTER — Inpatient Hospital Stay: Payer: Medicare Other | Attending: Hematology

## 2023-03-30 DIAGNOSIS — D751 Secondary polycythemia: Secondary | ICD-10-CM

## 2023-03-30 LAB — CBC
HCT: 44 % (ref 39.0–52.0)
Hemoglobin: 14 g/dL (ref 13.0–17.0)
MCH: 26.9 pg (ref 26.0–34.0)
MCHC: 31.8 g/dL (ref 30.0–36.0)
MCV: 84.5 fL (ref 80.0–100.0)
Platelets: 233 10*3/uL (ref 150–400)
RBC: 5.21 MIL/uL (ref 4.22–5.81)
RDW: 14.3 % (ref 11.5–15.5)
WBC: 5.3 10*3/uL (ref 4.0–10.5)
nRBC: 0 % (ref 0.0–0.2)

## 2023-04-01 ENCOUNTER — Inpatient Hospital Stay: Payer: Medicare Other

## 2023-04-01 DIAGNOSIS — D751 Secondary polycythemia: Secondary | ICD-10-CM | POA: Diagnosis not present

## 2023-04-01 NOTE — Patient Instructions (Signed)
[facility]  Discharge Instructions: Thank you for choosing [facility] to provide your oncology and hematology care.  If you have a lab appointment with the Cancer Center - please note that after April 8th, 2024, all labs will be drawn in the cancer center.  You do not have to check in or register with the main entrance as you have in the past but will complete your check-in in the cancer center.  Wear comfortable clothing and clothing appropriate for easy access to any Portacath or PICC line.   We strive to give you quality time with your provider. You may need to reschedule your appointment if you arrive late (15 or more minutes).  Arriving late affects you and other patients whose appointments are after yours.  Also, if you miss three or more appointments without notifying the office, you may be dismissed from the clinic at the provider's discretion.      For prescription refill requests, have your pharmacy contact our office and allow 72 hours for refills to be completed.    Today you received the following chemotherapy and/or immunotherapy agents Phlebotomy      To help prevent nausea and vomiting after your treatment, we encourage you to take your nausea medication as directed.  BELOW ARE SYMPTOMS THAT SHOULD BE REPORTED IMMEDIATELY: *FEVER GREATER THAN 100.4 F (38 C) OR HIGHER *CHILLS OR SWEATING *NAUSEA AND VOMITING THAT IS NOT CONTROLLED WITH YOUR NAUSEA MEDICATION *UNUSUAL SHORTNESS OF BREATH *UNUSUAL BRUISING OR BLEEDING *URINARY PROBLEMS (pain or burning when urinating, or frequent urination) *BOWEL PROBLEMS (unusual diarrhea, constipation, pain near the anus) TENDERNESS IN MOUTH AND THROAT WITH OR WITHOUT PRESENCE OF ULCERS (sore throat, sores in mouth, or a toothache) UNUSUAL RASH, SWELLING OR PAIN  UNUSUAL VAGINAL DISCHARGE OR ITCHING   Items with * indicate a potential emergency and should be followed up as soon as possible or go to the  Emergency Department if any problems should occur.  Please show the CHEMOTHERAPY ALERT CARD or IMMUNOTHERAPY ALERT CARD at check-in to the Emergency Department and triage nurse.  Should you have questions after your visit or need to cancel or reschedule your appointment, please contact [facility] [phone]  and follow the prompts.  Office hours are 8:00 a.m. to 4:30 p.m. Monday - Friday. Please note that voicemails left after 4:00 p.m. may not be returned until the following business day.  We are closed weekends and major holidays. You have access to a nurse at all times for urgent questions. Please call the main number to the clinic [phone] and follow the prompts.  For any non-urgent questions, you may also contact your provider using MyChart. We now offer e-Visits for anyone 91 and older to request care online for non-urgent symptoms. For details visit [email or web address].   Also download the MyChart app! Go to the app store, search "MyChart", open the app, select [place], and log in with your MyChart username and password.

## 2023-04-01 NOTE — Progress Notes (Signed)
Patient presents today for phlebotomy per MD orders. Phlebotomy procedure started at 1350 and ended at  1356   . 500 cc removed. Patient tolerated procedure well. Procedure tolerated well and without incident. Discharged ambulatory in stable condition.

## 2023-04-02 ENCOUNTER — Inpatient Hospital Stay: Payer: Medicare Other

## 2023-05-04 ENCOUNTER — Other Ambulatory Visit: Payer: Self-pay | Admitting: Family Medicine

## 2023-05-04 DIAGNOSIS — E669 Obesity, unspecified: Secondary | ICD-10-CM

## 2023-05-04 DIAGNOSIS — E1169 Type 2 diabetes mellitus with other specified complication: Secondary | ICD-10-CM

## 2023-05-04 DIAGNOSIS — I152 Hypertension secondary to endocrine disorders: Secondary | ICD-10-CM

## 2023-05-20 ENCOUNTER — Encounter: Payer: Self-pay | Admitting: Family Medicine

## 2023-05-20 ENCOUNTER — Ambulatory Visit (INDEPENDENT_AMBULATORY_CARE_PROVIDER_SITE_OTHER): Payer: Medicare Other | Admitting: Family Medicine

## 2023-05-20 VITALS — BP 125/74 | HR 56 | Ht 70.0 in | Wt 250.0 lb

## 2023-05-20 DIAGNOSIS — Z23 Encounter for immunization: Secondary | ICD-10-CM

## 2023-05-20 DIAGNOSIS — E669 Obesity, unspecified: Secondary | ICD-10-CM

## 2023-05-20 DIAGNOSIS — I152 Hypertension secondary to endocrine disorders: Secondary | ICD-10-CM

## 2023-05-20 DIAGNOSIS — R7309 Other abnormal glucose: Secondary | ICD-10-CM

## 2023-05-20 DIAGNOSIS — G4709 Other insomnia: Secondary | ICD-10-CM

## 2023-05-20 DIAGNOSIS — E1169 Type 2 diabetes mellitus with other specified complication: Secondary | ICD-10-CM

## 2023-05-20 DIAGNOSIS — G4733 Obstructive sleep apnea (adult) (pediatric): Secondary | ICD-10-CM | POA: Diagnosis not present

## 2023-05-20 DIAGNOSIS — E1159 Type 2 diabetes mellitus with other circulatory complications: Secondary | ICD-10-CM | POA: Diagnosis not present

## 2023-05-20 LAB — BAYER DCA HB A1C WAIVED: HB A1C (BAYER DCA - WAIVED): 7.2 % — ABNORMAL HIGH (ref 4.8–5.6)

## 2023-05-20 MED ORDER — ROSUVASTATIN CALCIUM 5 MG PO TABS: 5.0000 mg | ORAL_TABLET | Freq: Every day | ORAL | 3 refills | Status: DC

## 2023-05-20 MED ORDER — IRBESARTAN 150 MG PO TABS: 150.0000 mg | ORAL_TABLET | Freq: Every day | ORAL | 3 refills | Status: DC

## 2023-05-20 MED ORDER — TRAZODONE HCL 50 MG PO TABS
50.0000 mg | ORAL_TABLET | Freq: Every evening | ORAL | 3 refills | Status: DC | PRN
Start: 2023-05-20 — End: 2024-02-28

## 2023-05-20 NOTE — Progress Notes (Signed)
BP 125/74   Pulse (!) 56   Ht 5\' 10"  (1.778 m)   Wt 250 lb (113.4 kg)   SpO2 95%   BMI 35.87 kg/m    Subjective:   Patient ID: [name], male    DOB: 22-Jun-1951, 72 y.o.   MRN: [id number]  HPI: Jehovah Eskra is a 72 y.o. male presenting on 05/20/2023 for Medical Management of Chronic Issues and Diabetes   HPI Type 2 diabetes mellitus Patient comes in today for recheck of his diabetes. Patient has been currently taking metformin. Patient is currently on an ACE inhibitor/ARB. Patient has not seen an ophthalmologist this year. Patient denies any new issues with their feet. The symptom started onset as an adult hypertension sleep apnea obesity ARE RELATED TO DM   Hypertension Patient is currently on irbesartan, and their blood pressure today is also. Patient denies any lightheadedness or dizziness. Patient denies headaches, blurred vision, chest pains, shortness of breath, or weakness. Denies any side effects from medication and is content with current medication.   Sleep apnea Patient uses his sleep apnea machine 90 percent of the night Patient uses a sleep apnea machine 30 days of the month Patient feels like his CPAP makes a significant difference in his sleep and energy. Patient is on CPAP settings auto.   Relevant past medical, surgical, family and social history reviewed and updated as indicated. Interim medical history since our last visit reviewed. Allergies and medications reviewed and updated.  Review of Systems  Constitutional:  Negative for chills and fever.  Respiratory:  Negative for shortness of breath and wheezing.   Cardiovascular:  Negative for chest pain and leg swelling.  Musculoskeletal:  Negative for back pain and gait problem.  Skin:  Negative for rash.  All other systems reviewed and are negative.   Per HPI unless specifically indicated above   Allergies as of 05/20/2023       Reactions   Sulfa Antibiotics    rash   Ramipril Cough         Medication List        Accurate as of May 20, 2023  8:57 AM. If you have any questions, ask your nurse or doctor.          acetaminophen 325 MG tablet Commonly known as: Tylenol Take 2 tablets (650 mg total) by mouth every 6 (six) hours as needed for moderate pain or headache.   aspirin EC 81 MG tablet Take 81 mg by mouth every evening.   ferrous sulfate 325 (65 FE) MG tablet Take 325 mg by mouth daily with breakfast.   gabapentin 100 MG capsule Commonly known as: NEURONTIN Take 2 capsules (200 mg total) by mouth at bedtime.   glucose blood test strip 1 each by Other route 2 (two) times daily. Use to check blood glucose once daily at varying times.  Dispense Verio One Touch Test strips   hydrOXYzine 25 MG tablet Commonly known as: ATARAX Take 1 tablet (25 mg total) by mouth as needed. Cuts in half as needed   irbesartan 150 MG tablet Commonly known as: AVAPRO Take 1 tablet (150 mg total) by mouth daily.   metFORMIN 1000 MG tablet Commonly known as: GLUCOPHAGE Take 1 tablet (1,000 mg total) by mouth 2 (two) times daily with a meal.   multivitamin with minerals tablet Take 1 tablet by mouth daily.   OneTouch Delica Lancets 33G Misc Use to check blood glucose once daily at varying times   PROBIOTIC PO  Take 1 capsule by mouth 3 (three) times a week.   rosuvastatin 5 MG tablet Commonly known as: CRESTOR Take 1 tablet (5 mg total) by mouth at bedtime.   Semaglutide (2 MG/DOSE) 8 MG/3ML Sopn Inject 2 mg as directed once a week.   tamsulosin 0.4 MG Caps capsule Commonly known as: FLOMAX Take 1 capsule (0.4 mg total) by mouth daily after supper.   traZODone 50 MG tablet Commonly known as: DESYREL Take 1-2 tablets (50-100 mg total) by mouth at bedtime as needed for sleep (For insomnia as needed). What changed: how much to take Changed by: Elige Radon Dewaun Kinzler, MD         Objective:   BP 125/74   Pulse (!) 56   Ht 5\' 10"  (1.778 m)   Wt 250 lb (113.4 kg)    SpO2 95%   BMI 35.87 kg/m   Wt Readings from Last 3 Encounters:  05/20/23 250 lb (113.4 kg)  02/25/23 251 lb (113.9 kg)  02/12/23 251 lb (113.9 kg)    Physical Exam Vitals and nursing note reviewed.  Constitutional:      General: He is not in acute distress.    Appearance: He is well-developed. He is not diaphoretic.  Eyes:     General: No scleral icterus.       Right eye: No discharge.     Conjunctiva/sclera: Conjunctivae normal.     Pupils: Pupils are equal, round, and reactive to light.  Neck:     Thyroid: No thyromegaly.  Cardiovascular:     Rate and Rhythm: Normal rate and regular rhythm.     Heart sounds: Normal heart sounds. No murmur heard. Pulmonary:     Effort: Pulmonary effort is normal. No respiratory distress.     Breath sounds: Normal breath sounds. No wheezing.  Musculoskeletal:        General: Normal range of motion.     Cervical back: Neck supple.  Lymphadenopathy:     Cervical: No cervical adenopathy.  Skin:    General: Skin is warm and dry.     Findings: No rash.  Neurological:     Mental Status: He is alert and oriented to person, place, and time.     Coordination: Coordination normal.  Psychiatric:        Behavior: Behavior normal.       Assessment & Plan:   Problem List Items Addressed This Visit       Cardiovascular and Mediastinum   Hypertension associated with diabetes (HCC)   Relevant Medications   irbesartan (AVAPRO) 150 MG tablet   rosuvastatin (CRESTOR) 5 MG tablet     Respiratory   OSA on CPAP     Endocrine   Type 2 diabetes mellitus with obesity (HCC) - Primary   Relevant Medications   irbesartan (AVAPRO) 150 MG tablet   rosuvastatin (CRESTOR) 5 MG tablet   Other Relevant Orders   Bayer DCA Hb A1c Waived   Microalbumin / creatinine urine ratio   Other Visit Diagnoses     Other insomnia       Relevant Medications   traZODone (DESYREL) 50 MG tablet     Discussed dietary changes and what foods to avoid again that  he has made some improvements but definitely still has some improvements to make.  A1c 7.2 which is much improved.  Follow up plan: Return in about 3 months (around 08/20/2023), or if symptoms worsen or fail to improve, for Diabetes hypertension and sleep apnea.  Counseling provided  for all of the vaccine components Orders Placed This Encounter  Procedures   Bayer DCA Hb A1c Waived   Microalbumin / creatinine urine ratio    Arville Care, MD [facility] 05/20/2023, 8:57 AM

## 2023-05-20 NOTE — Addendum Note (Signed)
Addended by: Dorene Sorrow on: 05/20/2023 10:41 AM   Modules accepted: Orders

## 2023-05-23 LAB — MICROALBUMIN / CREATININE URINE RATIO
Creatinine, Urine: 123.7 mg/dL
Microalb/Creat Ratio: 23 mg/g creat (ref 0–29)
Microalbumin, Urine: 28.1 ug/mL

## 2023-06-02 ENCOUNTER — Other Ambulatory Visit: Payer: Self-pay

## 2023-06-02 DIAGNOSIS — D751 Secondary polycythemia: Secondary | ICD-10-CM

## 2023-06-02 NOTE — Progress Notes (Unsigned)
[facility] [address], [place] [address]   CLINIC:  Medical Oncology/Hematology  PCP:  Dettinger, Elige Radon, MD [address] [place] [address] [phone]   REASON FOR VISIT:  Follow-up for secondary polycythemia   PRIOR THERAPY: None   CURRENT THERAPY: Intermittent phlebotomies, last on 04/01/2023  INTERVAL HISTORY:   Mr. Bonser 72 y.o. male returns for routine follow-up of secondary polycythemia.  He was last seen by Rojelio Brenner PA-C on 11/27/2022.     At today's visit, he reports feeling well.  *** No recent hospitalizations, surgeries, or changes in baseline health status. *** He had phlebotomy x 3 since his last visit (every 2 months), most recently on 04/01/2023. *** He has been tolerating his phlebotomies well.  He reports that when he misses phlebotomy, he has increased symptoms of thirst and excessive heat. *** No erythromelalgia, aquagenic pruritus, Raynaud's, or vasomotor symptoms. *** No fever, chills, or unintentional weight loss. *** He remains compliant with CPAP. *** He is taking oral ferrous sulfate once daily.   He has ***% energy and ***% appetite. He endorses that he has been working on some intentional weight loss via diet and exercise, and has also been placed on Ozempic by his PCP.***   ASSESSMENT & PLAN:  1.  Secondary polycythemia (JAK2 negative): - Patient tested NEGATIVE for JAK2 mutations, CALR, and MPL. - Erythropoietin level was normal at 10 - Other work-up was normal (BCR/ABL, HIV, SPEP, ANA, CRP, RF) - He has sleep apnea and uses CPAP machine.  Lifelong non-smoker. - He started phlebotomies in February 2020, averaging phlebotomy every 6 to 8 weeks. - Most recent phlebotomy on 04/01/2023 - Reports excessive heat, night sweats, and thirst when his Hgb/HCT is elevated.   *** - Denies aquagenic pruritus or other vasomotor symptoms.    *** - Patient reports that he feels better when HCT < 40 - Differential  diagnosis favors secondary polycythemia in the setting of OSA, but it is also possible that he may have triple negative primary polycythemia - Labs today *** - PLAN: ***Proceed with phlebotomy today.  Continue CBC and phlebotomy every 8 weeks if HCT >40.  Office visit in 24 weeks. - Would consider bone marrow biopsy if he had any major changes in his blood counts.   2.  Sleep apnea*** - Patient reports compliance with CPAP nightly - PLAN: Continue CPAP   3.  Hemochromatosis carrier state*** - Hemochromatosis gene evaluation showed he had a single C282Y gene and is a carrier for hemochromatosis. - Significant iron deficiency noted in January 2023 with ferritin 7, iron saturation 7%, and TIBC 464 - He has been taking ferrous sulfate 325 mg daily since January 2023 - Most recent iron panel *** - PLAN: Continue with phlebotomy for polycythemia as above, will periodically check iron. - Mnagement of iron deficiency in the setting of secondary polycythemia is poorly defined, but it is reasonable to consider combination of phlebotomy in addition to oral iron therapy in order to improve iron stores while safely maintaining an asymptomatic hematocrit level. - Continue taking ferrous sulfate 325 mg OTC daily. - We will recheck iron panel at his follow-up visit in 24 weeks.   4.   Health Maintenance & Other: - Patient had a colonoscopy on 02/07/2019 that showed a 5 mm polyp in the ascending colon that showed tubular adenoma.  Diverticulosis in the rectosigmoid colon and the ascending colon.  Torturous colon.  External and internal hemorrhoids. - Elevated  PSA:  Patient has a PSA of 9.2, followed by [facility].  Prostate biopsy on 03/30/2017 with 12 cores submitted that were benign. - Positive hep B serology:  Patient has positive hep B surface antibody and positive hep B core antibody. Surface antigen and IgM are negative.  Hep C and HIV are negative. Patient is immune based on prior exposure.   PLAN  SUMMARY: *** >> CBC + phlebotomy every 8 weeks (proceed with phlebotomy if HCT >40) >> Same-day labs (CBC/D, ferritin, iron/TIBC) + office visit + phlebotomy in 6 months >> ***   [facility] **VISIT SUMMARY & IMPORTANT INSTRUCTIONS **   You were seen today by Rojelio Brenner PA-C for your ***.    *** ***  *** ***  LABS: Return in ***   OTHER TESTS: ***  MEDICATIONS: ***  FOLLOW-UP APPOINTMENT: ***     REVIEW OF SYSTEMS: ***  Review of Systems - Oncology   PHYSICAL EXAM:  ECOG PERFORMANCE STATUS: {CHL ONC ECOG IO:[id number]} *** There were no vitals filed for this visit. There were no vitals filed for this visit. Physical Exam  PAST MEDICAL/SURGICAL HISTORY:  Past Medical History:  Diagnosis Date   Diabetes mellitus without complication (HCC)    GERD (gastroesophageal reflux disease)    Hx of [place] spotted fever 1-1-12011   Hypertension    Sleep apnea    wears cpap    Past Surgical History:  Procedure Laterality Date   COLONOSCOPY  09/2012   Dr. Teena Dunk. Diverticulosis, descending adenomatous colon polyp, hyperplastic rectal polyp. ?mass in anal canal evaluated by surgery and consistent with internal hemorrhoid   COLONOSCOPY WITH PROPOFOL N/A 02/07/2019   Procedure: COLONOSCOPY WITH PROPOFOL;  Surgeon: West Bali, MD;  Location: [facility];  Service: Endoscopy;  Laterality: N/A;  9:30am   KNEE SURGERY Left 10/2014   KNEE SURGERY Right 09/2015   POLYPECTOMY  02/07/2019   Procedure: POLYPECTOMY;  Surgeon: West Bali, MD;  Location: [facility];  Service: Endoscopy;;    SOCIAL HISTORY:  Social History   Socioeconomic History   Marital status: Single    Spouse name: Not on file   Number of children: 0   Years of education: 12   Highest education level: 12th grade  Occupational History   Occupation: Retired    Comment: [employer]  Tobacco Use   Smoking status: Never   Smokeless  tobacco: Never  Vaping Use   Vaping Use: Never used  Substance and Sexual Activity   Alcohol use: No   Drug use: No   Sexual activity: Not Currently  Other Topics Concern   Not on file  Social History Narrative   WORKED AT [employer] AND [employer] FOR > 10 YRS. NOT MARRIED. FUR BABIES: 3 DOGS AND 5 CATS. SPENDS FREE TIME: GARDENS, BEE KEEPER, WALKS.   Social Determinants of Health   Financial Resource Strain: Low Risk  (05/16/2023)   Overall Financial Resource Strain (CARDIA)    Difficulty of Paying Living Expenses: Not hard at all  Food Insecurity: No Food Insecurity (05/16/2023)   Hunger Vital Sign    Worried About Running Out of Food in the Last Year: Never true    Ran Out of Food in the Last Year: Never true  Transportation Needs: No Transportation Needs (05/16/2023)   PRAPARE - Administrator, Civil Service (Medical): No    Lack of Transportation (Non-Medical): No  Physical Activity: Insufficiently Active (  05/16/2023)   Exercise Vital Sign    Days of Exercise per Week: 3 days    Minutes of Exercise per Session: 30 min  Stress: No Stress Concern Present (05/16/2023)   [employer] of Occupational Health - Occupational Stress Questionnaire    Feeling of Stress : Not at all  Social Connections: Moderately Isolated (05/16/2023)   Social Connection and Isolation Panel [NHANES]    Frequency of Communication with Friends and Family: More than three times a week    Frequency of Social Gatherings with Friends and Family: Once a week    Attends Religious Services: More than 4 times per year    Active Member of [employer] or Organizations: No    Attends Engineer, structural: Not on file    Marital Status: Never married  Intimate Partner Violence: Not At Risk (09/15/2021)   Humiliation, Afraid, Rape, and Kick questionnaire    Fear of Current or Ex-Partner: No    Emotionally Abused: No    Physically Abused: No    Sexually Abused: No    FAMILY HISTORY:  Family History   Problem Relation Age of Onset   Heart disease Mother    Congestive Heart Failure Mother    Diabetes Father    Aneurysm Sister    Diabetes Paternal Uncle    Diabetes Paternal Grandmother    Colon cancer Neg Hx    Colon polyps Neg Hx     CURRENT MEDICATIONS:  Outpatient Encounter Medications as of 06/03/2023  Medication Sig   acetaminophen (TYLENOL) 325 MG tablet Take 2 tablets (650 mg total) by mouth every 6 (six) hours as needed for moderate pain or headache.   aspirin EC 81 MG tablet Take 81 mg by mouth every evening.   ferrous sulfate 325 (65 FE) MG tablet Take 325 mg by mouth daily with breakfast.   gabapentin (NEURONTIN) 100 MG capsule Take 2 capsules (200 mg total) by mouth at bedtime.   glucose blood test strip 1 each by Other route 2 (two) times daily. Use to check blood glucose once daily at varying times.  Dispense Verio One Touch Test strips   hydrOXYzine (ATARAX) 25 MG tablet Take 1 tablet (25 mg total) by mouth as needed. Cuts in half as needed   irbesartan (AVAPRO) 150 MG tablet Take 1 tablet (150 mg total) by mouth daily.   metFORMIN (GLUCOPHAGE) 1000 MG tablet Take 1 tablet (1,000 mg total) by mouth 2 (two) times daily with a meal.   Multiple Vitamins-Minerals (MULTIVITAMIN WITH MINERALS) tablet Take 1 tablet by mouth daily.   ONETOUCH DELICA LANCETS 33G MISC Use to check blood glucose once daily at varying times   Probiotic Product (PROBIOTIC PO) Take 1 capsule by mouth 3 (three) times a week.    rosuvastatin (CRESTOR) 5 MG tablet Take 1 tablet (5 mg total) by mouth at bedtime.   Semaglutide, 2 MG/DOSE, 8 MG/3ML SOPN Inject 2 mg as directed once a week.   tamsulosin (FLOMAX) 0.4 MG CAPS capsule Take 1 capsule (0.4 mg total) by mouth daily after supper.   traZODone (DESYREL) 50 MG tablet Take 1-2 tablets (50-100 mg total) by mouth at bedtime as needed for sleep (For insomnia as needed).   No facility-administered encounter medications on file as of 06/03/2023.     ALLERGIES:  Allergies  Allergen Reactions   Sulfa Antibiotics     rash   Ramipril Cough    LABORATORY DATA:  I have reviewed the labs as listed.  CBC  Component Value Date/Time   WBC 5.3 03/30/2023 1000   RBC 5.21 03/30/2023 1000   HGB 14.0 03/30/2023 1000   HGB 14.6 01/28/2023 0804   HCT 44.0 03/30/2023 1000   HCT 45.1 01/28/2023 0804   PLT 233 03/30/2023 1000   PLT 248 01/28/2023 0804   MCV 84.5 03/30/2023 1000   MCV 87 01/28/2023 0804   MCH 26.9 03/30/2023 1000   MCHC 31.8 03/30/2023 1000   RDW 14.3 03/30/2023 1000   RDW 13.6 01/28/2023 0804   LYMPHSABS 1.3 01/28/2023 1351   LYMPHSABS 1.3 01/28/2023 0804   MONOABS 0.4 01/28/2023 1351   EOSABS 0.2 01/28/2023 1351   EOSABS 0.1 01/28/2023 0804   BASOSABS 0.1 01/28/2023 1351   BASOSABS 0.1 01/28/2023 0804      Latest Ref Rng & Units 01/28/2023    8:04 AM 05/21/2022    8:06 AM 11/05/2021    8:01 AM  CMP  Glucose 70 - 99 mg/dL 161  096  045   BUN 8 - 27 mg/dL 17  19  32   Creatinine 0.76 - 1.27 mg/dL 4.09  8.11  9.14   Sodium 134 - 144 mmol/L 138  138  132   Potassium 3.5 - 5.2 mmol/L 4.4  4.2  4.5   Chloride 96 - 106 mmol/L 102  100  92   CO2 20 - 29 mmol/L 20  21  24    Calcium 8.6 - 10.2 mg/dL 9.3  9.4  9.9   Total Protein 6.0 - 8.5 g/dL 6.9  7.1  7.2   Total Bilirubin 0.0 - 1.2 mg/dL 0.6  1.0  0.5   Alkaline Phos 44 - 121 IU/L 82  79  126   AST 0 - 40 IU/L 23  34  16   ALT 0 - 44 IU/L 21  29  20      DIAGNOSTIC IMAGING:  I have independently reviewed the relevant imaging and discussed with the patient.   WRAP UP:  All questions were answered. The patient knows to call the clinic with any problems, questions or concerns.  Medical decision making: ***  Time spent on visit: I spent *** minutes counseling the patient face to face. The total time spent in the appointment was *** minutes and more than 50% was on counseling.  Carnella Guadalajara, PA-C  ***

## 2023-06-03 ENCOUNTER — Inpatient Hospital Stay: Payer: Medicare Other

## 2023-06-03 ENCOUNTER — Inpatient Hospital Stay: Payer: Medicare Other | Attending: Hematology | Admitting: Physician Assistant

## 2023-06-03 VITALS — BP 126/73 | HR 64 | Temp 97.7°F | Resp 18 | Ht 71.0 in | Wt 247.0 lb

## 2023-06-03 DIAGNOSIS — R61 Generalized hyperhidrosis: Secondary | ICD-10-CM | POA: Diagnosis not present

## 2023-06-03 DIAGNOSIS — Z79899 Other long term (current) drug therapy: Secondary | ICD-10-CM | POA: Insufficient documentation

## 2023-06-03 DIAGNOSIS — Z148 Genetic carrier of other disease: Secondary | ICD-10-CM | POA: Diagnosis not present

## 2023-06-03 DIAGNOSIS — K219 Gastro-esophageal reflux disease without esophagitis: Secondary | ICD-10-CM | POA: Diagnosis not present

## 2023-06-03 DIAGNOSIS — G4730 Sleep apnea, unspecified: Secondary | ICD-10-CM | POA: Insufficient documentation

## 2023-06-03 DIAGNOSIS — E119 Type 2 diabetes mellitus without complications: Secondary | ICD-10-CM | POA: Diagnosis not present

## 2023-06-03 DIAGNOSIS — Z800 Family history of malignant neoplasm of digestive organs: Secondary | ICD-10-CM | POA: Diagnosis not present

## 2023-06-03 DIAGNOSIS — Z7982 Long term (current) use of aspirin: Secondary | ICD-10-CM | POA: Insufficient documentation

## 2023-06-03 DIAGNOSIS — D751 Secondary polycythemia: Secondary | ICD-10-CM | POA: Diagnosis not present

## 2023-06-03 DIAGNOSIS — D122 Benign neoplasm of ascending colon: Secondary | ICD-10-CM | POA: Diagnosis not present

## 2023-06-03 DIAGNOSIS — Z7984 Long term (current) use of oral hypoglycemic drugs: Secondary | ICD-10-CM | POA: Diagnosis not present

## 2023-06-03 DIAGNOSIS — I10 Essential (primary) hypertension: Secondary | ICD-10-CM | POA: Insufficient documentation

## 2023-06-03 LAB — CBC WITH DIFFERENTIAL/PLATELET
Abs Immature Granulocytes: 0.02 10*3/uL (ref 0.00–0.07)
Basophils Absolute: 0.1 10*3/uL (ref 0.0–0.1)
Basophils Relative: 1 %
Eosinophils Absolute: 0.2 10*3/uL (ref 0.0–0.5)
Eosinophils Relative: 2 %
HCT: 44.6 % (ref 39.0–52.0)
Hemoglobin: 14.5 g/dL (ref 13.0–17.0)
Immature Granulocytes: 0 %
Lymphocytes Relative: 22 %
Lymphs Abs: 1.8 10*3/uL (ref 0.7–4.0)
MCH: 26.5 pg (ref 26.0–34.0)
MCHC: 32.5 g/dL (ref 30.0–36.0)
MCV: 81.5 fL (ref 80.0–100.0)
Monocytes Absolute: 0.7 10*3/uL (ref 0.1–1.0)
Monocytes Relative: 9 %
Neutro Abs: 5.5 10*3/uL (ref 1.7–7.7)
Neutrophils Relative %: 66 %
Platelets: 297 10*3/uL (ref 150–400)
RBC: 5.47 MIL/uL (ref 4.22–5.81)
RDW: 15.6 % — ABNORMAL HIGH (ref 11.5–15.5)
WBC: 8.3 10*3/uL (ref 4.0–10.5)
nRBC: 0 % (ref 0.0–0.2)

## 2023-06-03 LAB — IRON AND TIBC
Iron: 49 ug/dL (ref 45–182)
Saturation Ratios: 12 % — ABNORMAL LOW (ref 17.9–39.5)
TIBC: 418 ug/dL (ref 250–450)
UIBC: 369 ug/dL

## 2023-06-03 LAB — FERRITIN: Ferritin: 10 ng/mL — ABNORMAL LOW (ref 24–336)

## 2023-06-03 NOTE — Patient Instructions (Signed)
[facility]  Discharge Instructions: Thank you for choosing [facility] to provide your oncology and hematology care.  If you have a lab appointment with the Cancer Center - please note that after April 8th, 2024, all labs will be drawn in the cancer center.  You do not have to check in or register with the main entrance as you have in the past but will complete your check-in in the cancer center.  Wear comfortable clothing and clothing appropriate for easy access to any Portacath or PICC line.   We strive to give you quality time with your provider. You may need to reschedule your appointment if you arrive late (15 or more minutes).  Arriving late affects you and other patients whose appointments are after yours.  Also, if you miss three or more appointments without notifying the office, you may be dismissed from the clinic at the provider's discretion.      For prescription refill requests, have your pharmacy contact our office and allow 72 hours for refills to be completed.    Today you received the following phlebotomy.     To help prevent nausea and vomiting after your treatment, we encourage you to take your nausea medication as directed.  BELOW ARE SYMPTOMS THAT SHOULD BE REPORTED IMMEDIATELY: *FEVER GREATER THAN 100.4 F (38 C) OR HIGHER *CHILLS OR SWEATING *NAUSEA AND VOMITING THAT IS NOT CONTROLLED WITH YOUR NAUSEA MEDICATION *UNUSUAL SHORTNESS OF BREATH *UNUSUAL BRUISING OR BLEEDING *URINARY PROBLEMS (pain or burning when urinating, or frequent urination) *BOWEL PROBLEMS (unusual diarrhea, constipation, pain near the anus) TENDERNESS IN MOUTH AND THROAT WITH OR WITHOUT PRESENCE OF ULCERS (sore throat, sores in mouth, or a toothache) UNUSUAL RASH, SWELLING OR PAIN  UNUSUAL VAGINAL DISCHARGE OR ITCHING   Items with * indicate a potential emergency and should be followed up as soon as possible or go to the Emergency Department if any problems  should occur.  Please show the CHEMOTHERAPY ALERT CARD or IMMUNOTHERAPY ALERT CARD at check-in to the Emergency Department and triage nurse.  Should you have questions after your visit or need to cancel or reschedule your appointment, please contact [facility] [phone]  and follow the prompts.  Office hours are 8:00 a.m. to 4:30 p.m. Monday - Friday. Please note that voicemails left after 4:00 p.m. may not be returned until the following business day.  We are closed weekends and major holidays. You have access to a nurse at all times for urgent questions. Please call the main number to the clinic [phone] and follow the prompts.  For any non-urgent questions, you may also contact your provider using MyChart. We now offer e-Visits for anyone 18 and older to request care online for non-urgent symptoms. For details visit [email or web address].   Also download the MyChart app! Go to the app store, search "MyChart", open the app, select [facility], and log in with your MyChart username and password.   

## 2023-06-03 NOTE — Patient Instructions (Signed)
[facility] Discharge Instructions  You were seen today by Fernando Torry PA-C for your elevated blood counts.  We will schedule you for labs and possible phlebotomy every 8 weeks.    You should CONTINUE taking iron pills daily due to your low iron.  We will check your iron levels again at your next office visit.  LABS: CBC and possible phlebotomy every 8 weeks   MEDICATIONS: Daily iron tablet (ferrous sulfate)  FOLLOW-UP APPOINTMENT: Office visit in about 6 months   - - - - - - - - - - - - - - - - - -    Thank you for choosing [facility] to provide your oncology and hematology care.  To afford each patient quality time with our provider, please arrive at least 15 minutes before your scheduled appointment time.   If you have a lab appointment with the Cancer Center please come in thru the Main Entrance and check in at the main information desk.  You need to re-schedule your appointment should you arrive 10 or more minutes late.  We strive to give you quality time with our providers, and arriving late affects you and other patients whose appointments are after yours.  Also, if you no show three or more times for appointments you may be dismissed from the clinic at the providers discretion.     Again, thank you for choosing [facility].  Our hope is that these requests will decrease the amount of time that you wait before being seen by our physicians.       _____________________________________________________________  Should you have questions after your visit to [facility], please contact our office at [phone] and follow the prompts.  Our office hours are 8:00 a.m. and 4:30 p.m. Monday - Friday.  Please note that voicemails left after 4:00 p.m. may not be returned until the following business day.  We are closed weekends and major holidays.  You do have access to a nurse 24-7,  just call the main number to the clinic [phone] and do not press any options, hold on the line and a nurse will answer the phone.    For prescription refill requests, have your pharmacy contact our office and allow 72 hours.    Due to Covid, you will need to wear a mask upon entering the hospital. If you do not have a mask, a mask will be given to you at the Main Entrance upon arrival. For doctor visits, patients may have 1 support person age 18 or older with them. For treatment visits, patients can not have anyone with them due to social distancing guidelines and our immunocompromised population.   

## 2023-06-03 NOTE — Progress Notes (Signed)
[name] presents today for theraputic phlebotomy per MD orders. Last hgb/hct  was 14.5/44.6 .VSS prior to procedure. Pt reports eating before arrival. Procedure started at 1442 using patients right AC. 500 mL of blood removed. Procedure ended at 1450. Gauze and coban applied to [facility], site clean and dry. VSS upon completion of procedure. Pt denies dizziness, lightheadedness, or feeling faint. Patient refused to wait recommended weight time.  Discharged in satisfactory condition with follow up instructions.

## 2023-07-11 DIAGNOSIS — G4733 Obstructive sleep apnea (adult) (pediatric): Secondary | ICD-10-CM | POA: Diagnosis not present

## 2023-07-29 ENCOUNTER — Inpatient Hospital Stay: Payer: Medicare Other

## 2023-07-29 ENCOUNTER — Inpatient Hospital Stay: Payer: Medicare Other | Attending: Hematology

## 2023-07-29 DIAGNOSIS — D751 Secondary polycythemia: Secondary | ICD-10-CM | POA: Diagnosis not present

## 2023-07-29 LAB — CBC
HCT: 43.2 % (ref 39.0–52.0)
Hemoglobin: 13.6 g/dL (ref 13.0–17.0)
MCH: 25.9 pg — ABNORMAL LOW (ref 26.0–34.0)
MCHC: 31.5 g/dL (ref 30.0–36.0)
MCV: 82.3 fL (ref 80.0–100.0)
Platelets: 246 10*3/uL (ref 150–400)
RBC: 5.25 MIL/uL (ref 4.22–5.81)
RDW: 15.2 % (ref 11.5–15.5)
WBC: 5.6 10*3/uL (ref 4.0–10.5)
nRBC: 0 % (ref 0.0–0.2)

## 2023-07-29 NOTE — Progress Notes (Signed)
Patient presents today for therapeutic phlebotomy.  Patient is in satisfactory condition with no new complaints voiced.  Vital signs are stable.  We will proceed with phlebotomy per provider orders.  Therapeutic phlebotomy started at 0948 and ended at 0959.  500 mL of blood removed from R arm.  Vital signs remained stable.  Patient denies dizziness or lightheadedness.  Bandaid and gauze applied to site. Patient refused to wait the recommended 30 minute post phlebotomy wait time.  Patient left ambulatory in stable condition.

## 2023-07-29 NOTE — Patient Instructions (Signed)

## 2023-08-09 ENCOUNTER — Other Ambulatory Visit: Payer: Self-pay | Admitting: Family Medicine

## 2023-08-09 DIAGNOSIS — E669 Obesity, unspecified: Secondary | ICD-10-CM

## 2023-08-24 ENCOUNTER — Telehealth: Payer: Self-pay | Admitting: Family Medicine

## 2023-08-26 ENCOUNTER — Ambulatory Visit (INDEPENDENT_AMBULATORY_CARE_PROVIDER_SITE_OTHER): Payer: Medicare Other | Admitting: Family Medicine

## 2023-08-26 ENCOUNTER — Ambulatory Visit (INDEPENDENT_AMBULATORY_CARE_PROVIDER_SITE_OTHER): Payer: Medicare Other

## 2023-08-26 ENCOUNTER — Encounter: Payer: Self-pay | Admitting: Family Medicine

## 2023-08-26 VITALS — BP 122/66 | HR 56 | Ht 71.0 in | Wt 255.0 lb

## 2023-08-26 DIAGNOSIS — E7800 Pure hypercholesterolemia, unspecified: Secondary | ICD-10-CM | POA: Diagnosis not present

## 2023-08-26 DIAGNOSIS — E1169 Type 2 diabetes mellitus with other specified complication: Secondary | ICD-10-CM

## 2023-08-26 DIAGNOSIS — I152 Hypertension secondary to endocrine disorders: Secondary | ICD-10-CM

## 2023-08-26 DIAGNOSIS — Z23 Encounter for immunization: Secondary | ICD-10-CM | POA: Diagnosis not present

## 2023-08-26 DIAGNOSIS — Z7984 Long term (current) use of oral hypoglycemic drugs: Secondary | ICD-10-CM

## 2023-08-26 DIAGNOSIS — E669 Obesity, unspecified: Secondary | ICD-10-CM | POA: Diagnosis not present

## 2023-08-26 DIAGNOSIS — E1159 Type 2 diabetes mellitus with other circulatory complications: Secondary | ICD-10-CM

## 2023-08-26 DIAGNOSIS — R972 Elevated prostate specific antigen [PSA]: Secondary | ICD-10-CM | POA: Diagnosis not present

## 2023-08-26 LAB — HM DIABETES EYE EXAM

## 2023-08-26 LAB — BAYER DCA HB A1C WAIVED: HB A1C (BAYER DCA - WAIVED): 7.4 % — ABNORMAL HIGH (ref 4.8–5.6)

## 2023-08-26 NOTE — Progress Notes (Signed)
BP 122/66   Pulse (!) 56   Ht 5\' 11"  (1.803 m)   Wt 255 lb (115.7 kg)   SpO2 94%   BMI 35.57 kg/m    Subjective:   Patient ID: [name], male    DOB: 03/25/1951, 72 y.o.   MRN: [id number]  HPI: [name] is a 72 y.o. male presenting on 08/26/2023 for Medical Management of Chronic Issues, Diabetes, and Hypertension   HPI Type 2 diabetes mellitus Patient comes in today for recheck of his diabetes. Patient has been currently taking metformin and Ozempic. Patient is currently on an ACE inhibitor/ARB. Patient has not seen an ophthalmologist this year. Patient denies any new issues with their feet. The symptom started onset as an adult hypertension ARE RELATED TO DM   Hypertension Patient is currently on irbesartan, and their blood pressure today is 122/66. Patient denies any lightheadedness or dizziness. Patient denies headaches, blurred vision, chest pains, shortness of breath, or weakness. Denies any side effects from medication and is content with current medication.   Elevated PSA Patient is coming in for recheck on PSA, recheck today Symptoms: None currently Medication: Flomax Last PSA: 09/10/2019, sees urology  Relevant past medical, surgical, family and social history reviewed and updated as indicated. Interim medical history since our last visit reviewed. Allergies and medications reviewed and updated.  Review of Systems  Constitutional:  Negative for chills and fever.  Eyes:  Negative for visual disturbance.  Respiratory:  Negative for shortness of breath and wheezing.   Cardiovascular:  Negative for chest pain and leg swelling.  Musculoskeletal:  Negative for back pain and gait problem.  Skin:  Negative for rash.  Neurological:  Negative for dizziness and light-headedness.  All other systems reviewed and are negative.   Per HPI unless specifically indicated above   Allergies as of 08/26/2023       Reactions   Sulfa Antibiotics    rash   Ramipril  Cough        Medication List        Accurate as of August 26, 2023 11:37 AM. If you have any questions, ask your nurse or doctor.          acetaminophen 325 MG tablet Commonly known as: Tylenol Take 2 tablets (650 mg total) by mouth every 6 (six) hours as needed for moderate pain or headache.   aspirin EC 81 MG tablet Take 81 mg by mouth every evening.   ferrous sulfate 325 (65 FE) MG tablet Take 325 mg by mouth daily with breakfast.   gabapentin 100 MG capsule Commonly known as: NEURONTIN Take 2 capsules (200 mg total) by mouth at bedtime.   hydrOXYzine 25 MG tablet Commonly known as: ATARAX Take 1 tablet (25 mg total) by mouth as needed. Cuts in half as needed   irbesartan 150 MG tablet Commonly known as: AVAPRO Take 1 tablet (150 mg total) by mouth daily.   metFORMIN 1000 MG tablet Commonly known as: GLUCOPHAGE Take 1 tablet (1,000 mg total) by mouth 2 (two) times daily with a meal.   multivitamin with minerals tablet Take 1 tablet by mouth daily.   OneTouch Delica Lancets 33G Misc Use to check blood glucose once daily at varying times   OneTouch Verio test strip Generic drug: glucose blood Check BS once daily at varying times Dx E11.69   PROBIOTIC PO Take 1 capsule by mouth 3 (three) times a week.   rosuvastatin 5 MG tablet Commonly known as: CRESTOR Take 1  tablet (5 mg total) by mouth at bedtime.   Semaglutide (2 MG/DOSE) 8 MG/3ML Sopn Inject 2 mg as directed once a week.   tamsulosin 0.4 MG Caps capsule Commonly known as: FLOMAX Take 1 capsule (0.4 mg total) by mouth daily after supper.   traZODone 50 MG tablet Commonly known as: DESYREL Take 1-2 tablets (50-100 mg total) by mouth at bedtime as needed for sleep (For insomnia as needed).         Objective:   BP 122/66   Pulse (!) 56   Ht 5\' 11"  (1.803 m)   Wt 255 lb (115.7 kg)   SpO2 94%   BMI 35.57 kg/m   Wt Readings from Last 3 Encounters:  08/26/23 255 lb (115.7 kg)   06/03/23 247 lb (112 kg)  05/20/23 250 lb (113.4 kg)    Physical Exam Vitals and nursing note reviewed.  Constitutional:      General: He is not in acute distress.    Appearance: He is well-developed. He is not diaphoretic.  Eyes:     General: No scleral icterus.    Conjunctiva/sclera: Conjunctivae normal.  Neck:     Thyroid: No thyromegaly.  Cardiovascular:     Rate and Rhythm: Normal rate and regular rhythm.     Heart sounds: Normal heart sounds. No murmur heard. Pulmonary:     Effort: Pulmonary effort is normal. No respiratory distress.     Breath sounds: Normal breath sounds. No wheezing.  Musculoskeletal:        General: No swelling. Normal range of motion.     Cervical back: Neck supple.  Lymphadenopathy:     Cervical: No cervical adenopathy.  Skin:    General: Skin is warm and dry.     Findings: No rash.  Neurological:     Mental Status: He is alert and oriented to person, place, and time.     Coordination: Coordination normal.  Psychiatric:        Behavior: Behavior normal.       Assessment & Plan:   Problem List Items Addressed This Visit       Cardiovascular and Mediastinum   Hypertension associated with diabetes (HCC) - Primary   Relevant Orders   CBC with Differential/Platelet   CMP14+EGFR   Lipid panel   Bayer DCA Hb A1c Waived     Endocrine   Type 2 diabetes mellitus with obesity (HCC)   Relevant Orders   Bayer DCA Hb A1c Waived     Other   Elevated PSA   Relevant Orders   PSA, total and free   Other Visit Diagnoses     Pure hypercholesterolemia       Relevant Orders   CBC with Differential/Platelet   CMP14+EGFR   Lipid panel   Bayer DCA Hb A1c Waived   Encounter for immunization       Relevant Orders   Flu Vaccine Trivalent High Dose (Fluad) (Completed)       A1c slightly up at 7.4, focus on diet. Blood pressure and everything else looks good, no changes Follow up plan: Return in about 3 months (around 11/25/2023), or if  symptoms worsen or fail to improve, for Diabetes recheck.  Counseling provided for all of the vaccine components Orders Placed This Encounter  Procedures   Flu Vaccine Trivalent High Dose (Fluad)   CBC with Differential/Platelet   CMP14+EGFR   Lipid panel   Bayer DCA Hb A1c Waived   PSA, total and free    Ivin Booty  Aliyha Fornes, MD [facility] 08/26/2023, 11:37 AM

## 2023-08-26 NOTE — Progress Notes (Signed)
[name] arrived 08/26/2023 and has given verbal consent to obtain images and complete their overdue diabetic retinal screening.  The images have been sent to an ophthalmologist or optometrist for review and interpretation.  Results will be sent back to Dettinger, Elige Radon, MD for review.  Patient has been informed they will be contacted when we receive the results via telephone or MyChart

## 2023-08-27 LAB — CBC WITH DIFFERENTIAL/PLATELET
Basophils Absolute: 0.1 10*3/uL (ref 0.0–0.2)
Basos: 1 %
EOS (ABSOLUTE): 0.2 10*3/uL (ref 0.0–0.4)
Eos: 3 %
Hematocrit: 41.3 % (ref 37.5–51.0)
Hemoglobin: 12.8 g/dL — ABNORMAL LOW (ref 13.0–17.7)
Immature Grans (Abs): 0 10*3/uL (ref 0.0–0.1)
Immature Granulocytes: 0 %
Lymphocytes Absolute: 1.4 10*3/uL (ref 0.7–3.1)
Lymphs: 24 %
MCH: 26 pg — ABNORMAL LOW (ref 26.6–33.0)
MCHC: 31 g/dL — ABNORMAL LOW (ref 31.5–35.7)
MCV: 84 fL (ref 79–97)
Monocytes Absolute: 0.5 10*3/uL (ref 0.1–0.9)
Monocytes: 8 %
Neutrophils Absolute: 3.7 10*3/uL (ref 1.4–7.0)
Neutrophils: 64 %
Platelets: 266 10*3/uL (ref 150–450)
RBC: 4.92 x10E6/uL (ref 4.14–5.80)
RDW: 14.9 % (ref 11.6–15.4)
WBC: 5.7 10*3/uL (ref 3.4–10.8)

## 2023-08-27 LAB — PSA, TOTAL AND FREE
PSA, Free Pct: 37.9 %
PSA, Free: 2.84 ng/mL
Prostate Specific Ag, Serum: 7.5 ng/mL — ABNORMAL HIGH (ref 0.0–4.0)

## 2023-08-27 LAB — CMP14+EGFR
ALT: 21 IU/L (ref 0–44)
AST: 20 IU/L (ref 0–40)
Albumin: 4 g/dL (ref 3.8–4.8)
Alkaline Phosphatase: 91 IU/L (ref 44–121)
BUN/Creatinine Ratio: 14 (ref 10–24)
BUN: 17 mg/dL (ref 8–27)
Bilirubin Total: 0.6 mg/dL (ref 0.0–1.2)
CO2: 21 mmol/L (ref 20–29)
Calcium: 9.2 mg/dL (ref 8.6–10.2)
Chloride: 101 mmol/L (ref 96–106)
Creatinine, Ser: 1.24 mg/dL (ref 0.76–1.27)
Globulin, Total: 2.8 g/dL (ref 1.5–4.5)
Glucose: 190 mg/dL — ABNORMAL HIGH (ref 70–99)
Potassium: 4.7 mmol/L (ref 3.5–5.2)
Sodium: 136 mmol/L (ref 134–144)
Total Protein: 6.8 g/dL (ref 6.0–8.5)
eGFR: 62 mL/min/{1.73_m2} (ref >59)

## 2023-08-27 LAB — LIPID PANEL
Chol/HDL Ratio: 3 ratio (ref 0.0–5.0)
Cholesterol, Total: 103 mg/dL (ref 100–199)
HDL: 34 mg/dL — ABNORMAL LOW (ref >39)
LDL Chol Calc (NIH): 41 mg/dL (ref 0–99)
Triglycerides: 164 mg/dL — ABNORMAL HIGH (ref 0–149)
VLDL Cholesterol Cal: 28 mg/dL (ref 5–40)

## 2023-09-21 ENCOUNTER — Ambulatory Visit (INDEPENDENT_AMBULATORY_CARE_PROVIDER_SITE_OTHER): Payer: Medicare Other

## 2023-09-21 VITALS — Ht 71.0 in | Wt 270.0 lb

## 2023-09-21 DIAGNOSIS — Z0000 Encounter for general adult medical examination without abnormal findings: Secondary | ICD-10-CM

## 2023-09-21 NOTE — Patient Instructions (Signed)
Mr. Sena , Thank you for taking time to come for your Medicare Wellness Visit. I appreciate your ongoing commitment to your health goals. Please review the following plan we discussed and let me know if I can assist you in the future.   Referrals/Orders/Follow-Ups/Clinician Recommendations: Aim for 30 minutes of exercise or brisk walking, 6-8 glasses of water, and 5 servings of fruits and vegetables each day.   This is a list of the screening recommended for you and due dates:  Health Maintenance  Topic Date Due   COVID-19 Vaccine (3 - Moderna risk series) 03/21/2020   Complete foot exam   02/12/2024   Hemoglobin A1C  02/23/2024   Yearly kidney health urinalysis for diabetes  05/19/2024   Yearly kidney function blood test for diabetes  08/25/2024   Eye exam for diabetics  08/25/2024   Medicare Annual Wellness Visit  09/20/2024   DTaP/Tdap/Td vaccine (2 - Td or Tdap) 09/25/2024   Colon Cancer Screening  02/07/2029   Pneumonia Vaccine  Completed   Flu Shot  Completed   Hepatitis C Screening  Completed   Zoster (Shingles) Vaccine  Completed   HPV Vaccine  Aged Out    Advanced directives: (Copy Requested) Please bring a copy of your health care power of attorney and living will to the office to be added to your chart at your convenience.  Next Medicare Annual Wellness Visit scheduled for next year: Yes  insert Preventive Care attachment Insert FALL PREVENTION attachment if needed

## 2023-09-21 NOTE — Progress Notes (Signed)
Subjective:   [name] is a 72 y.o. male who presents for Medicare Annual/Subsequent preventive examination.  Visit Complete: Virtual I connected with  [name] on 09/21/23 by a audio enabled telemedicine application and verified that I am speaking with the correct person using two identifiers.  Patient Location: Home  Provider Location: Home Office  I discussed the limitations of evaluation and management by telemedicine. The patient expressed understanding and agreed to proceed.  Vital Signs: Because this visit was a virtual/telehealth visit, some criteria may be missing or patient reported. Any vitals not documented were not able to be obtained and vitals that have been documented are patient reported.  Patient Medicare AWV questionnaire was completed by the patient on 09/21/2023; I have confirmed that all information answered by patient is correct and no changes since this date.        Objective:    Today's Vitals   09/21/23 1305  Weight: 270 lb (122.5 kg)  Height: 5\' 11"  (1.803 m)   Body mass index is 37.66 kg/m.     09/21/2023    1:10 PM 06/03/2023    3:04 PM 06/03/2023    1:51 PM 04/01/2023    2:09 PM 11/27/2022   10:54 AM 11/27/2022    9:19 AM 09/16/2022   11:17 AM  Advanced Directives  Does Patient Have a Medical Advance Directive? Yes Yes Yes Yes Yes Yes Yes  Type of Estate agent of [place];Living will Healthcare Power of [place];Living will Healthcare Power of [place];Living will Living will;Healthcare Power of Attorney Living will;Healthcare Power of [employer] Power of [place];Living will Healthcare Power of Attorney  Does patient want to make changes to medical advance directive?   No - Patient declined  No - Patient declined No - Patient declined No - Patient declined  Copy of Healthcare Power of Attorney in Chart? No - copy requested  No - copy requested No - copy requested  No - copy requested   Would patient  like information on creating a medical advance directive?   No - Patient declined No - Patient declined No - Patient declined No - Patient declined     Current Medications (verified) Outpatient Encounter Medications as of 09/21/2023  Medication Sig   acetaminophen (TYLENOL) 325 MG tablet Take 2 tablets (650 mg total) by mouth every 6 (six) hours as needed for moderate pain or headache.   aspirin EC 81 MG tablet Take 81 mg by mouth every evening.   ferrous sulfate 325 (65 FE) MG tablet Take 325 mg by mouth daily with breakfast.   gabapentin (NEURONTIN) 100 MG capsule Take 2 capsules (200 mg total) by mouth at bedtime.   glucose blood (ONETOUCH VERIO) test strip Check BS once daily at varying times Dx E11.69   hydrOXYzine (ATARAX) 25 MG tablet Take 1 tablet (25 mg total) by mouth as needed. Cuts in half as needed   irbesartan (AVAPRO) 150 MG tablet Take 1 tablet (150 mg total) by mouth daily.   metFORMIN (GLUCOPHAGE) 1000 MG tablet Take 1 tablet (1,000 mg total) by mouth 2 (two) times daily with a meal.   Multiple Vitamins-Minerals (MULTIVITAMIN WITH MINERALS) tablet Take 1 tablet by mouth daily.   ONETOUCH DELICA LANCETS 33G MISC Use to check blood glucose once daily at varying times   Probiotic Product (PROBIOTIC PO) Take 1 capsule by mouth 3 (three) times a week.    rosuvastatin (CRESTOR) 5 MG tablet Take 1 tablet (5 mg total) by mouth at  bedtime.   Semaglutide, 2 MG/DOSE, 8 MG/3ML SOPN Inject 2 mg as directed once a week.   tamsulosin (FLOMAX) 0.4 MG CAPS capsule Take 1 capsule (0.4 mg total) by mouth daily after supper.   traZODone (DESYREL) 50 MG tablet Take 1-2 tablets (50-100 mg total) by mouth at bedtime as needed for sleep (For insomnia as needed).   No facility-administered encounter medications on file as of 09/21/2023.    Allergies (verified) Sulfa antibiotics and Ramipril   History: Past Medical History:  Diagnosis Date   Diabetes mellitus without complication (HCC)    GERD  (gastroesophageal reflux disease)    Hx of [place] spotted fever [id number]   Hypertension    Sleep apnea    wears cpap    Past Surgical History:  Procedure Laterality Date   COLONOSCOPY  09/2012   Dr. Teena Dunk. Diverticulosis, descending adenomatous colon polyp, hyperplastic rectal polyp. ?mass in anal canal evaluated by surgery and consistent with internal hemorrhoid   COLONOSCOPY WITH PROPOFOL N/A 02/07/2019   Procedure: COLONOSCOPY WITH PROPOFOL;  Surgeon: West Bali, MD;  Location: [facility];  Service: Endoscopy;  Laterality: N/A;  9:30am   KNEE SURGERY Left 10/2014   KNEE SURGERY Right 09/2015   POLYPECTOMY  02/07/2019   Procedure: POLYPECTOMY;  Surgeon: West Bali, MD;  Location: [facility];  Service: Endoscopy;;   Family History  Problem Relation Age of Onset   Heart disease Mother    Congestive Heart Failure Mother    Diabetes Father    Aneurysm Sister    Diabetes Paternal Uncle    Diabetes Paternal Grandmother    Colon cancer Neg Hx    Colon polyps Neg Hx    Social History   Socioeconomic History   Marital status: Single    Spouse name: Not on file   Number of children: 0   Years of education: 12   Highest education level: 12th grade  Occupational History   Occupation: Retired    Comment: [employer]  Tobacco Use   Smoking status: Never   Smokeless tobacco: Never  Vaping Use   Vaping status: Never Used  Substance and Sexual Activity   Alcohol use: No   Drug use: No   Sexual activity: Not Currently  Other Topics Concern   Not on file  Social History Narrative   WORKED AT [employer] AND [employer] FOR > 10 YRS. NOT MARRIED. FUR BABIES: 3 DOGS AND 5 CATS. SPENDS FREE TIME: GARDENS, BEE KEEPER, WALKS.   Social Determinants of Health   Financial Resource Strain: Low Risk  (09/21/2023)   Overall Financial Resource Strain (CARDIA)    Difficulty of Paying Living Expenses: Not hard at all  Food Insecurity: No Food Insecurity (09/21/2023)    Hunger Vital Sign    Worried About Running Out of Food in the Last Year: Never true    Ran Out of Food in the Last Year: Never true  Transportation Needs: No Transportation Needs (09/21/2023)   PRAPARE - Administrator, Civil Service (Medical): No    Lack of Transportation (Non-Medical): No  Physical Activity: Insufficiently Active (09/21/2023)   Exercise Vital Sign    Days of Exercise per Week: 3 days    Minutes of Exercise per Session: 30 min  Stress: No Stress Concern Present (09/21/2023)   [employer] of Occupational Health - Occupational Stress Questionnaire    Feeling of Stress : Not at all  Social Connections: Moderately Isolated (09/21/2023)  Social Advertising account executive [NHANES]    Frequency of Communication with Friends and Family: More than three times a week    Frequency of Social Gatherings with Friends and Family: More than three times a week    Attends Religious Services: More than 4 times per year    Active Member of [employer] or Organizations: No    Attends Engineer, structural: Never    Marital Status: Never married    Tobacco Counseling Counseling given: Not Answered   Clinical Intake:  Pre-visit preparation completed: Yes  Pain : No/denies pain     Nutritional Risks: None Diabetes: No  How often do you need to have someone help you when you read instructions, pamphlets, or other written materials from your doctor or pharmacy?: 1 - Never  Interpreter Needed?: No  Information entered by :: Renie Ora, LPN   Activities of Daily Living    09/20/2023    7:56 AM  In your present state of health, do you have any difficulty performing the following activities:  Hearing? 0  Vision? 0  Difficulty concentrating or making decisions? 0  Walking or climbing stairs? 0  Dressing or bathing? 0  Doing errands, shopping? 0  Preparing Food and eating ? N  Using the Toilet? N  In the past six months, have you accidently leaked  urine? N  Do you have problems with loss of bowel control? N  Managing your Medications? N  Managing your Finances? N  Housekeeping or managing your Housekeeping? N    Patient Care Team: Dettinger, Elige Radon, MD as PCP - General (Family Medicine) Doree Barthel, NP-C (Inactive) as Nurse Practitioner (Hematology and Oncology) Marcine Matar, MD as Consulting Physician (Urology) Danella Maiers, [facility] as Pharmacist (Family Medicine)  Indicate any recent Medical Services you may have received from other than [facility] providers in the past year (date may be approximate).     Assessment:   This is a routine wellness examination for [place].  Hearing/Vision screen Vision Screening - Comments:: Wears rx glasses - up to date with routine eye exams with  [facility]   Goals Addressed             This Visit's Progress    DIET - INCREASE WATER INTAKE   On track    Try to drink 6-8 glasses of water daily.       Depression Screen    09/21/2023    1:08 PM 08/26/2023   10:57 AM 05/20/2023    8:35 AM 02/25/2023    1:50 PM 02/12/2023    8:28 AM 09/16/2022   11:22 AM 05/21/2022    8:01 AM  PHQ 2/9 Scores  PHQ - 2 Score 0 0 0 0 0 0 0  PHQ- 9 Score 0   0       Fall Risk    09/21/2023    1:07 PM 09/20/2023    7:56 AM 08/26/2023   10:57 AM 05/20/2023    8:35 AM 02/25/2023    1:50 PM  Fall Risk   Falls in the past year? 0 0 0 0 0  Number falls in past yr: 0      Injury with Fall? 0      Risk for fall due to : No Fall Risks      Follow up Falls prevention discussed        MEDICARE RISK AT HOME: Medicare Risk at Home Any stairs in or around the home?: Yes If so,  are there any without handrails?: No Home free of loose throw rugs in walkways, pet beds, electrical cords, etc?: Yes Adequate lighting in your home to reduce risk of falls?: Yes Life alert?: No Use of a cane, walker or w/c?: No Grab bars in the bathroom?: Yes Shower chair or bench in shower?: Yes Elevated toilet seat or a handicapped  toilet?: Yes  TIMED UP AND GO:  Was the test performed?  No    Cognitive Function:    05/24/2018    8:23 AM  MMSE - Mini Mental State Exam  Orientation to time 5  Orientation to Place 5  Registration 3  Attention/ Calculation 5  Recall 3  Language- name 2 objects 2  Language- repeat 1  Language- follow 3 step command 3  Language- read & follow direction 1  Write a sentence 1  Copy design 1  Total score 30        09/21/2023    1:10 PM 09/16/2022   11:20 AM 09/13/2020   10:47 AM 09/13/2019   10:47 AM  6CIT Screen  What Year? 0 points 0 points 0 points 0 points  What month? 0 points 0 points 0 points 0 points  What time? 0 points 0 points  0 points  Count back from 20 0 points 0 points  0 points  Months in reverse 0 points 0 points  0 points  Repeat phrase 0 points 2 points  2 points  Total Score 0 points 2 points  2 points    Immunizations Immunization History  Administered Date(s) Administered   Fluad Quad(high Dose 65+) 10/10/2019, 08/20/2020, 10/14/2021, 11/03/2022   Fluad Trivalent(High Dose 65+) 08/26/2023   Influenza,inj,Quad PF,6+ Mos 09/25/2014, 10/22/2016, 09/22/2017, 09/23/2018   Influenza-Unspecified 08/20/2020   Moderna Sars-Covid-2 Vaccination 01/25/2020, 02/22/2020   Pneumococcal Conjugate-13 09/27/2020   Pneumococcal Polysaccharide-23 10/14/2021   Tdap 09/25/2014   Zoster Recombinant(Shingrix) 04/20/2018, 05/20/2023    TDAP status: Up to date  Flu Vaccine status: Up to date  Pneumococcal vaccine status: Up to date  Covid-19 vaccine status: Completed vaccines  Qualifies for Shingles Vaccine? Yes   Zostavax completed Yes   Shingrix Completed?: Yes  Screening Tests Health Maintenance  Topic Date Due   COVID-19 Vaccine (3 - Moderna risk series) 03/21/2020   FOOT EXAM  02/12/2024   HEMOGLOBIN A1C  02/23/2024   Diabetic kidney evaluation - Urine ACR  05/19/2024   Diabetic kidney evaluation - eGFR measurement  08/25/2024   OPHTHALMOLOGY EXAM   08/25/2024   Medicare Annual Wellness (AWV)  09/20/2024   DTaP/Tdap/Td (2 - Td or Tdap) 09/25/2024   Colonoscopy  02/07/2029   Pneumonia Vaccine 34+ Years old  Completed   INFLUENZA VACCINE  Completed   Hepatitis C Screening  Completed   Zoster Vaccines- Shingrix  Completed   HPV VACCINES  Aged Out    Health Maintenance  Health Maintenance Due  Topic Date Due   COVID-19 Vaccine (3 - Moderna risk series) 03/21/2020    Colorectal cancer screening: Type of screening: Colonoscopy. Completed 02/07/2019. Repeat every 10 years  Lung Cancer Screening: (Low Dose CT Chest recommended if Age 102-80 years, 20 pack-year currently smoking OR have quit w/in 15years.) does not qualify.   Lung Cancer Screening Referral: n/a  Additional Screening:  Hepatitis C Screening: does not qualify; Completed 01/24/2019  Vision Screening: Recommended annual ophthalmology exams for early detection of glaucoma and other disorders of the eye. Is the patient up to date with their annual eye exam?  Yes  Who is the provider or what is the name of the office in which the patient attends annual eye exams? [facility]  If pt is not established with a provider, would they like to be referred to a provider to establish care? No .   Dental Screening: Recommended annual dental exams for proper oral hygiene   Community Resource Referral / Chronic Care Management: CRR required this visit?  No   CCM required this visit?  No     Plan:     I have personally reviewed and noted the following in the patient's chart:   Medical and social history Use of alcohol, tobacco or illicit drugs  Current medications and supplements including opioid prescriptions. Patient is not currently taking opioid prescriptions. Functional ability and status Nutritional status Physical activity Advanced directives List of other physicians Hospitalizations, surgeries, and ER visits in previous 12 months Vitals Screenings to include  cognitive, depression, and falls Referrals and appointments  In addition, I have reviewed and discussed with patient certain preventive protocols, quality metrics, and best practice recommendations. A written personalized care plan for preventive services as well as general preventive health recommendations were provided to patient.     Lorrene Reid, LPN   [id number]/08/[id number]   After Visit Summary: (MyChart) Due to this being a telephonic visit, the after visit summary with patients personalized plan was offered to patient via MyChart   Nurse Notes: none

## 2023-09-23 ENCOUNTER — Inpatient Hospital Stay: Payer: Medicare Other | Attending: Hematology

## 2023-09-23 ENCOUNTER — Inpatient Hospital Stay: Payer: Medicare Other

## 2023-09-23 DIAGNOSIS — D751 Secondary polycythemia: Secondary | ICD-10-CM | POA: Diagnosis not present

## 2023-09-23 LAB — CBC
HCT: 43.1 % (ref 39.0–52.0)
Hemoglobin: 13.6 g/dL (ref 13.0–17.0)
MCH: 25.3 pg — ABNORMAL LOW (ref 26.0–34.0)
MCHC: 31.6 g/dL (ref 30.0–36.0)
MCV: 80.1 fL (ref 80.0–100.0)
Platelets: 269 10*3/uL (ref 150–400)
RBC: 5.38 MIL/uL (ref 4.22–5.81)
RDW: 14.9 % (ref 11.5–15.5)
WBC: 7.4 10*3/uL (ref 4.0–10.5)
nRBC: 0 % (ref 0.0–0.2)

## 2023-09-23 NOTE — Patient Instructions (Signed)
[facility]  Discharge Instructions: Thank you for choosing [facility] to provide your oncology and hematology care.  If you have a lab appointment with the Cancer Center - please note that after April 8th, 2024, all labs will be drawn in the cancer center.  You do not have to check in or register with the main entrance as you have in the past but will complete your check-in in the cancer center.  Wear comfortable clothing and clothing appropriate for easy access to any Portacath or PICC line.   We strive to give you quality time with your provider. You may need to reschedule your appointment if you arrive late (15 or more minutes).  Arriving late affects you and other patients whose appointments are after yours.  Also, if you miss three or more appointments without notifying the office, you may be dismissed from the clinic at the provider's discretion.      For prescription refill requests, have your pharmacy contact our office and allow 72 hours for refills to be completed.    Today you received the following chemotherapy and/or immunotherapy agents Phlebotomy      To help prevent nausea and vomiting after your treatment, we encourage you to take your nausea medication as directed.  BELOW ARE SYMPTOMS THAT SHOULD BE REPORTED IMMEDIATELY: *FEVER GREATER THAN 100.4 F (38 C) OR HIGHER *CHILLS OR SWEATING *NAUSEA AND VOMITING THAT IS NOT CONTROLLED WITH YOUR NAUSEA MEDICATION *UNUSUAL SHORTNESS OF BREATH *UNUSUAL BRUISING OR BLEEDING *URINARY PROBLEMS (pain or burning when urinating, or frequent urination) *BOWEL PROBLEMS (unusual diarrhea, constipation, pain near the anus) TENDERNESS IN MOUTH AND THROAT WITH OR WITHOUT PRESENCE OF ULCERS (sore throat, sores in mouth, or a toothache) UNUSUAL RASH, SWELLING OR PAIN  UNUSUAL VAGINAL DISCHARGE OR ITCHING   Items with * indicate a potential emergency and should be followed up as soon as possible or go to the  Emergency Department if any problems should occur.  Please show the CHEMOTHERAPY ALERT CARD or IMMUNOTHERAPY ALERT CARD at check-in to the Emergency Department and triage nurse.  Should you have questions after your visit or need to cancel or reschedule your appointment, please contact [facility] [phone]  and follow the prompts.  Office hours are 8:00 a.m. to 4:30 p.m. Monday - Friday. Please note that voicemails left after 4:00 p.m. may not be returned until the following business day.  We are closed weekends and major holidays. You have access to a nurse at all times for urgent questions. Please call the main number to the clinic [phone] and follow the prompts.  For any non-urgent questions, you may also contact your provider using MyChart. We now offer e-Visits for anyone 18 and older to request care online for non-urgent symptoms. For details visit [email or web address].   Also download the MyChart app! Go to the app store, search "MyChart", open the app, select [facility], and log in with your MyChart username and password.   

## 2023-09-23 NOTE — Progress Notes (Signed)
Patient presents today for phlebotomy per MD orders. Phlebotomy procedure started at 1437 and ended at  1441. 500 cc removed. Patient tolerated procedure well. Procedure tolerated well and without incident. Discharged ambulatory in stable condition.

## 2023-11-16 NOTE — Progress Notes (Unsigned)
[facility] [address], [place] [address]   CLINIC:  Medical Oncology/Hematology  PCP:  Dettinger, Elige Radon, MD [address] [place] [address] [phone]   REASON FOR VISIT:  Follow-up for secondary polycythemia   PRIOR THERAPY: None   CURRENT THERAPY: Intermittent phlebotomies, last on 09/23/2023  INTERVAL HISTORY:   [name] 72 y.o. male returns for routine follow-up of secondary polycythemia.  He was last seen by Rojelio Brenner PA-C on 06/03/2023.     At today's visit, he reports feeling well. ***  No recent hospitalizations, surgeries, or changes in baseline health status.  He had phlebotomy x 3 since his last visit (every 2 months), most recently on 09/23/2023. *** He has been tolerating his phlebotomies well. *** He reports that when he misses phlebotomy, he has increased symptoms of thirst and excessive heat. *** No erythromelalgia, aquagenic pruritus, Raynaud's, or vasomotor symptoms. *** No fever, chills, or unintentional weight loss. *** He remains compliant with CPAP. *** He is taking oral ferrous sulfate once daily.   He has 85***% energy and 90***% appetite. He endorses that he has been trying to lose weight via diet and exercise, and has also been placed on Ozempic by his PCP.  ASSESSMENT & PLAN:  1.  Secondary polycythemia (JAK2 negative): - Patient tested NEGATIVE for JAK2 mutations, CALR, and MPL. - Erythropoietin level was normal at 10 - Other work-up was normal (BCR/ABL, HIV, SPEP, ANA, CRP, RF) - He has sleep apnea and uses CPAP machine.  Lifelong non-smoker. - He started phlebotomies in February 2020, averaging phlebotomy every 6 to 8 weeks. - Most recent phlebotomy on 09/23/2023 - Reports excessive heat, night sweats, and thirst when his Hgb/HCT is elevated (>40) - Denies aquagenic pruritus or other vasomotor symptoms.    *** - Patient reports that he feels better when HCT < 40 - Differential diagnosis favors  secondary polycythemia in the setting of OSA, but it is also possible that he may have triple negative primary polycythemia - Labs today (11/17/2023): Hgb ***/hematocrit ***.   - PLAN: Proceed with phlebotomy today.  Continue CBC and phlebotomy every 8 weeks if HCT >40.  Office visit in 24 weeks. - Would consider bone marrow biopsy if he had any major changes in his blood counts.  Patient declines bone marrow biopsy at this time.   2.  Sleep apnea - Patient reports compliance with CPAP nightly - PLAN: Continue CPAP   3.  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 - Significant iron deficiency noted in January 2023 with ferritin 7, iron saturation 7%, and TIBC 464 - He has been taking ferrous sulfate 325 mg daily since January 2023 - Most recent iron panel (11/17/2023): Ferritin ***, iron saturation ***%.   - PLAN: Continue with phlebotomy for polycythemia as above, will periodically check iron. - Management of iron deficiency in the setting of secondary polycythemia is poorly defined, but it is reasonable to consider combination of phlebotomy in addition to oral iron therapy in order to improve iron stores while safely maintaining an asymptomatic hematocrit level. - Continue taking ferrous sulfate 325 mg OTC daily.   4.   Health Maintenance & Other: - Patient had a colonoscopy on 02/07/2019 that showed a 5 mm polyp in the ascending colon that showed tubular adenoma.  Diverticulosis in the rectosigmoid colon and the ascending colon.  Torturous colon.  External and internal  hemorrhoids. - Elevated PSA:  Patient has a PSA of 9.2, followed by [facility].  Prostate biopsy on 03/30/2017 with 12 cores submitted that were benign. - Positive hep B serology:  Patient has positive hep B surface antibody and positive hep B core antibody. Surface antigen and IgM are negative.  Hep C and HIV are negative. Patient is  immune based on prior exposure.   PLAN SUMMARY: *** >> CBC + phlebotomy every 8 weeks (proceed with phlebotomy if HCT >40) >> Same-day labs (CBC/D) + office visit + phlebotomy in 6 months     REVIEW OF SYSTEMS: ***  Review of Systems  Constitutional:  Negative for appetite change, chills, diaphoresis, fatigue, fever and unexpected weight change.  HENT:   Negative for lump/mass and nosebleeds.   Eyes:  Negative for eye problems.  Respiratory:  Negative for cough, hemoptysis and shortness of breath.   Cardiovascular:  Negative for chest pain, leg swelling and palpitations.  Gastrointestinal:  Negative for abdominal pain, blood in stool, constipation, diarrhea, nausea and vomiting.  Genitourinary:  Negative for hematuria.   Musculoskeletal:  Positive for arthralgias.  Skin: Negative.   Neurological:  Negative for dizziness, headaches and light-headedness.  Hematological:  Does not bruise/bleed easily.  Psychiatric/Behavioral:  Positive for sleep disturbance.      PHYSICAL EXAM:  ECOG PERFORMANCE STATUS: 1 - Symptomatic but completely ambulatory *** There were no vitals filed for this visit. There were no vitals filed for this visit. Physical Exam Constitutional:      Appearance: Normal appearance. He is obese.  Cardiovascular:     Heart sounds: Normal heart sounds.  Pulmonary:     Breath sounds: Normal breath sounds.  Neurological:     General: No focal deficit present.     Mental Status: Mental status is at baseline.  Psychiatric:        Behavior: Behavior normal. Behavior is cooperative.     PAST MEDICAL/SURGICAL HISTORY:  Past Medical History:  Diagnosis Date   Diabetes mellitus without complication (HCC)    GERD (gastroesophageal reflux disease)    Hx of Rocky Mountain spotted fever 1-1-12011   Hypertension    Sleep apnea    wears cpap    Past Surgical History:  Procedure Laterality Date   COLONOSCOPY  09/2012   Dr. Teena Dunk. Diverticulosis, descending  adenomatous colon polyp, hyperplastic rectal polyp. ?mass in anal canal evaluated by surgery and consistent with internal hemorrhoid   COLONOSCOPY WITH PROPOFOL N/A 02/07/2019   Procedure: COLONOSCOPY WITH PROPOFOL;  Surgeon: West Bali, MD;  [facility];  Service: Endoscopy;  Laterality: N/A;  9:30am   KNEE SURGERY Left 10/2014   KNEE SURGERY Right 09/2015   POLYPECTOMY  02/07/2019   Procedure: POLYPECTOMY;  Surgeon: West Bali, MD;  Location: [facility];  Service: Endoscopy;;    SOCIAL HISTORY:  Social History   Socioeconomic History   Marital status: Single    Spouse name: Not on file   Number of children: 0   Years of education: 12   Highest education level: 12th grade  Occupational History   Occupation: Retired    Comment: [employer]  Tobacco Use   Smoking status: Never   Smokeless tobacco: Never  Vaping Use   Vaping status: Never Used  Substance and Sexual Activity   Alcohol use: No   Drug use: No   Sexual activity: Not Currently  Other Topics Concern   Not on file  Social History Narrative  WORKED AT [employer] AND [employer] FOR > 10 YRS. NOT MARRIED. FUR BABIES: 3 DOGS AND 5 CATS. SPENDS FREE TIME: GARDENS, BEE KEEPER, WALKS.   Social Determinants of Health   Financial Resource Strain: Low Risk  (09/21/2023)   Overall Financial Resource Strain (CARDIA)    Difficulty of Paying Living Expenses: Not hard at all  Food Insecurity: No Food Insecurity (09/21/2023)   Hunger Vital Sign    Worried About Running Out of Food in the Last Year: Never true    Ran Out of Food in the Last Year: Never true  Transportation Needs: No Transportation Needs (09/21/2023)   PRAPARE - Administrator, Civil Service (Medical): No    Lack of Transportation (Non-Medical): No  Physical Activity: Insufficiently Active (09/21/2023)   Exercise Vital Sign    Days of Exercise per Week: 3 days    Minutes of Exercise per Session: 30 min  Stress: No Stress  Concern Present (09/21/2023)   [employer] of Occupational Health - Occupational Stress Questionnaire    Feeling of Stress : Not at all  Social Connections: Moderately Isolated (09/21/2023)   Social Connection and Isolation Panel [NHANES]    Frequency of Communication with Friends and Family: More than three times a week    Frequency of Social Gatherings with Friends and Family: More than three times a week    Attends Religious Services: More than 4 times per year    Active Member of [employer] or Organizations: No    Attends Banker Meetings: Never    Marital Status: Never married  Intimate Partner Violence: Not At Risk (09/21/2023)   Humiliation, Afraid, Rape, and Kick questionnaire    Fear of Current or Ex-Partner: No    Emotionally Abused: No    Physically Abused: No    Sexually Abused: No    FAMILY HISTORY:  Family History  Problem Relation Age of Onset   Heart disease Mother    Congestive Heart Failure Mother    Diabetes Father    Aneurysm Sister    Diabetes Paternal Uncle    Diabetes Paternal Grandmother    Colon cancer Neg Hx    Colon polyps Neg Hx     CURRENT MEDICATIONS:  Outpatient Encounter Medications as of 11/17/2023  Medication Sig   acetaminophen (TYLENOL) 325 MG tablet Take 2 tablets (650 mg total) by mouth every 6 (six) hours as needed for moderate pain or headache.   aspirin EC 81 MG tablet Take 81 mg by mouth every evening.   ferrous sulfate 325 (65 FE) MG tablet Take 325 mg by mouth daily with breakfast.   gabapentin (NEURONTIN) 100 MG capsule Take 2 capsules (200 mg total) by mouth at bedtime.   glucose blood (ONETOUCH VERIO) test strip Check BS once daily at varying times Dx E11.69   hydrOXYzine (ATARAX) 25 MG tablet Take 1 tablet (25 mg total) by mouth as needed. Cuts in half as needed   irbesartan (AVAPRO) 150 MG tablet Take 1 tablet (150 mg total) by mouth daily.   metFORMIN (GLUCOPHAGE) 1000 MG tablet Take 1 tablet (1,000 mg total) by  mouth 2 (two) times daily with a meal.   Multiple Vitamins-Minerals (MULTIVITAMIN WITH MINERALS) tablet Take 1 tablet by mouth daily.   ONETOUCH DELICA LANCETS 33G MISC Use to check blood glucose once daily at varying times   Probiotic Product (PROBIOTIC PO) Take 1 capsule by mouth 3 (three) times a week.    rosuvastatin (CRESTOR) 5 MG tablet  Take 1 tablet (5 mg total) by mouth at bedtime.   Semaglutide, 2 MG/DOSE, 8 MG/3ML SOPN Inject 2 mg as directed once a week.   tamsulosin (FLOMAX) 0.4 MG CAPS capsule Take 1 capsule (0.4 mg total) by mouth daily after supper.   traZODone (DESYREL) 50 MG tablet Take 1-2 tablets (50-100 mg total) by mouth at bedtime as needed for sleep (For insomnia as needed).   No facility-administered encounter medications on file as of 11/17/2023.    ALLERGIES:  Allergies  Allergen Reactions   Sulfa Antibiotics     rash   Ramipril Cough    LABORATORY DATA:  I have reviewed the labs as listed.  CBC    Component Value Date/Time   WBC 7.4 09/23/2023 1329   RBC 5.38 09/23/2023 1329   HGB 13.6 09/23/2023 1329   HGB 12.8 (L) 08/26/2023 1055   HCT 43.1 09/23/2023 1329   HCT 41.3 08/26/2023 1055   PLT 269 09/23/2023 1329   PLT 266 08/26/2023 1055   MCV 80.1 09/23/2023 1329   MCV 84 08/26/2023 1055   MCH 25.3 (L) 09/23/2023 1329   MCHC 31.6 09/23/2023 1329   RDW 14.9 09/23/2023 1329   RDW 14.9 08/26/2023 1055   LYMPHSABS 1.4 08/26/2023 1055   MONOABS 0.7 06/03/2023 1235   EOSABS 0.2 08/26/2023 1055   BASOSABS 0.1 08/26/2023 1055      Latest Ref Rng & Units 08/26/2023   10:55 AM 01/28/2023    8:04 AM 05/21/2022    8:06 AM  CMP  Glucose 70 - 99 mg/dL 161  096  045   BUN 8 - 27 mg/dL 17  17  19    Creatinine 0.76 - 1.27 mg/dL 4.09  8.11  9.14   Sodium 134 - 144 mmol/L 136  138  138   Potassium 3.5 - 5.2 mmol/L 4.7  4.4  4.2   Chloride 96 - 106 mmol/L 101  102  100   CO2 20 - 29 mmol/L 21  20  21    Calcium 8.6 - 10.2 mg/dL 9.2  9.3  9.4   Total Protein  6.0 - 8.5 g/dL 6.8  6.9  7.1   Total Bilirubin 0.0 - 1.2 mg/dL 0.6  0.6  1.0   Alkaline Phos 44 - 121 IU/L 91  82  79   AST 0 - 40 IU/L 20  23  34   ALT 0 - 44 IU/L 21  21  29      DIAGNOSTIC IMAGING:  I have independently reviewed the relevant imaging and discussed with the patient.   WRAP UP:  All questions were answered. The patient knows to call the clinic with any problems, questions or concerns.  Medical decision making: Low  Time spent on visit: I spent 15 minutes counseling the patient face to face. The total time spent in the appointment was 22 minutes and more than 50% was on counseling.  Carnella Gu[place]ara, PA-C  ***

## 2023-11-17 ENCOUNTER — Inpatient Hospital Stay: Payer: Medicare Other | Admitting: Physician Assistant

## 2023-11-17 ENCOUNTER — Inpatient Hospital Stay: Payer: Medicare Other

## 2023-11-17 ENCOUNTER — Inpatient Hospital Stay: Payer: Medicare Other | Attending: Hematology

## 2023-11-17 VITALS — BP 144/73

## 2023-11-17 VITALS — BP 157/69 | HR 52 | Temp 96.4°F | Resp 16 | Wt 252.8 lb

## 2023-11-17 DIAGNOSIS — D751 Secondary polycythemia: Secondary | ICD-10-CM

## 2023-11-17 DIAGNOSIS — Z148 Genetic carrier of other disease: Secondary | ICD-10-CM

## 2023-11-17 DIAGNOSIS — G4730 Sleep apnea, unspecified: Secondary | ICD-10-CM | POA: Diagnosis not present

## 2023-11-17 DIAGNOSIS — R972 Elevated prostate specific antigen [PSA]: Secondary | ICD-10-CM | POA: Insufficient documentation

## 2023-11-17 LAB — CBC WITH DIFFERENTIAL/PLATELET
Abs Immature Granulocytes: 0.02 10*3/uL (ref 0.00–0.07)
Basophils Absolute: 0.1 10*3/uL (ref 0.0–0.1)
Basophils Relative: 1 %
Eosinophils Absolute: 0.2 10*3/uL (ref 0.0–0.5)
Eosinophils Relative: 2 %
HCT: 44 % (ref 39.0–52.0)
Hemoglobin: 13.5 g/dL (ref 13.0–17.0)
Immature Granulocytes: 0 %
Lymphocytes Relative: 22 %
Lymphs Abs: 1.5 10*3/uL (ref 0.7–4.0)
MCH: 24.5 pg — ABNORMAL LOW (ref 26.0–34.0)
MCHC: 30.7 g/dL (ref 30.0–36.0)
MCV: 79.9 fL — ABNORMAL LOW (ref 80.0–100.0)
Monocytes Absolute: 0.6 10*3/uL (ref 0.1–1.0)
Monocytes Relative: 8 %
Neutro Abs: 4.6 10*3/uL (ref 1.7–7.7)
Neutrophils Relative %: 67 %
Platelets: 238 10*3/uL (ref 150–400)
RBC: 5.51 MIL/uL (ref 4.22–5.81)
RDW: 15.9 % — ABNORMAL HIGH (ref 11.5–15.5)
WBC: 6.9 10*3/uL (ref 4.0–10.5)
nRBC: 0 % (ref 0.0–0.2)

## 2023-11-17 LAB — IRON AND TIBC
Iron: 54 ug/dL (ref 45–182)
Saturation Ratios: 12 % — ABNORMAL LOW (ref 17.9–39.5)
TIBC: 446 ug/dL (ref 250–450)
UIBC: 392 ug/dL

## 2023-11-17 LAB — FERRITIN: Ferritin: 10 ng/mL — ABNORMAL LOW (ref 24–336)

## 2023-11-17 NOTE — Patient Instructions (Signed)
[facility] [place] - A DEPT OF [facility]  Discharge Instructions: Thank you for choosing [facility] to provide your oncology and hematology care.  If you have a lab appointment with the Cancer Center - please note that after April 8th, 2024, all labs will be drawn in the cancer center.  You do not have to check in or register with the main entrance as you have in the past but will complete your check-in in the cancer center.  Wear comfortable clothing and clothing appropriate for easy access to any Portacath or PICC line.   We strive to give you quality time with your provider. You may need to reschedule your appointment if you arrive late (15 or more minutes).  Arriving late affects you and other patients whose appointments are after yours.  Also, if you miss three or more appointments without notifying the office, you may be dismissed from the clinic at the provider's discretion.      For prescription refill requests, have your pharmacy contact our office and allow 72 hours for refills to be completed.    Today you received the following chemotherapy and/or immunotherapy agents Therapeutic phlebotomy, return as scheduled.   To help prevent nausea and vomiting after your treatment, we encourage you to take your nausea medication as directed.  BELOW ARE SYMPTOMS THAT SHOULD BE REPORTED IMMEDIATELY: *FEVER GREATER THAN 100.4 F (38 C) OR HIGHER *CHILLS OR SWEATING *NAUSEA AND VOMITING THAT IS NOT CONTROLLED WITH YOUR NAUSEA MEDICATION *UNUSUAL SHORTNESS OF BREATH *UNUSUAL BRUISING OR BLEEDING *URINARY PROBLEMS (pain or burning when urinating, or frequent urination) *BOWEL PROBLEMS (unusual diarrhea, constipation, pain near the anus) TENDERNESS IN MOUTH AND THROAT WITH OR WITHOUT PRESENCE OF ULCERS (sore throat, sores in mouth, or a toothache) UNUSUAL RASH, SWELLING OR PAIN  UNUSUAL VAGINAL DISCHARGE OR ITCHING   Items with * indicate a potential  emergency and should be followed up as soon as possible or go to the Emergency Department if any problems should occur.  Please show the CHEMOTHERAPY ALERT CARD or IMMUNOTHERAPY ALERT CARD at check-in to the Emergency Department and triage nurse.  Should you have questions after your visit or need to cancel or reschedule your appointment, please contact [facility] [place] - A DEPT OF [facility] [phone]  and follow the prompts.  Office hours are 8:00 a.m. to 4:30 p.m. Monday - Friday. Please note that voicemails left after 4:00 p.m. may not be returned until the following business day.  We are closed weekends and major holidays. You have access to a nurse at all times for urgent questions. Please call the main number to the clinic [phone] and follow the prompts.  For any non-urgent questions, you may also contact your provider using MyChart. We now offer e-Visits for anyone 6 and older to request care online for non-urgent symptoms. For details visit [email or web address].   Also download the MyChart app! Go to the app store, search "MyChart", open the app, select [place], and log in with your MyChart username and password.

## 2023-11-17 NOTE — Progress Notes (Signed)
[name] presents today for phlebotomy per MD orders. Phlebotomy procedure started at 1510 and ended at 1515. 500 cc removed. Patient tolerated procedure well. IV needle removed intact.

## 2023-11-17 NOTE — Patient Instructions (Signed)
[place] [facility] Discharge Instructions  You were seen today by Tarri Abernethy PA-C for your elevated blood counts.  We will schedule you for labs and possible phlebotomy every 8 weeks.    You should CONTINUE taking iron pills daily due to your low iron.  We will check your iron levels again at your next office visit.  LABS: CBC and possible phlebotomy every 8 weeks   MEDICATIONS: Daily iron tablet (ferrous sulfate)  FOLLOW-UP APPOINTMENT: Office visit in about 6 months   - - - - - - - - - - - - - - - - - -    Thank you for choosing [place] at [facility] to provide your oncology and hematology care.  To afford each patient quality time with our provider, please arrive at least 15 minutes before your scheduled appointment time.   If you have a lab appointment with the [facility] please come in thru the Main Entrance and check in at the main information desk.  You need to re-schedule your appointment should you arrive 10 or more minutes late.  We strive to give you quality time with our providers, and arriving late affects you and other patients whose appointments are after yours.  Also, if you no show three or more times for appointments you may be dismissed from the clinic at the providers discretion.     Again, thank you for choosing [facility].  Our hope is that these requests will decrease the amount of time that you wait before being seen by our physicians.       _____________________________________________________________  Should you have questions after your visit to [facility], please contact our office at [phone] and follow the prompts.  Our office hours are 8:00 a.m. and 4:30 p.m. Monday - Friday.  Please note that voicemails left after 4:00 p.m. may not be returned until the following business day.  We are closed weekends and major holidays.  You do have access to a nurse 24-7,  just call the main number to the clinic [phone] and do not press any options, hold on the line and a nurse will answer the phone.    For prescription refill requests, have your pharmacy contact our office and allow 72 hours.    Due to Covid, you will need to wear a mask upon entering the hospital. If you do not have a mask, a mask will be given to you at the Main Entrance upon arrival. For doctor visits, patients may have 1 support person age 41 or older with them. For treatment visits, patients can not have anyone with them due to social distancing guidelines and our immunocompromised population.

## 2023-11-18 ENCOUNTER — Ambulatory Visit: Payer: Medicare Other | Admitting: Hematology

## 2023-11-18 ENCOUNTER — Other Ambulatory Visit: Payer: Medicare Other

## 2023-11-25 ENCOUNTER — Encounter: Payer: Self-pay | Admitting: Family Medicine

## 2023-11-25 ENCOUNTER — Ambulatory Visit (INDEPENDENT_AMBULATORY_CARE_PROVIDER_SITE_OTHER): Payer: Medicare Other | Admitting: Family Medicine

## 2023-11-25 VITALS — BP 114/67 | HR 68 | Ht 71.0 in | Wt 246.0 lb

## 2023-11-25 DIAGNOSIS — E7800 Pure hypercholesterolemia, unspecified: Secondary | ICD-10-CM | POA: Diagnosis not present

## 2023-11-25 DIAGNOSIS — E1159 Type 2 diabetes mellitus with other circulatory complications: Secondary | ICD-10-CM

## 2023-11-25 DIAGNOSIS — Z6834 Body mass index (BMI) 34.0-34.9, adult: Secondary | ICD-10-CM

## 2023-11-25 DIAGNOSIS — E1169 Type 2 diabetes mellitus with other specified complication: Secondary | ICD-10-CM | POA: Diagnosis not present

## 2023-11-25 DIAGNOSIS — Z7984 Long term (current) use of oral hypoglycemic drugs: Secondary | ICD-10-CM | POA: Diagnosis not present

## 2023-11-25 DIAGNOSIS — I152 Hypertension secondary to endocrine disorders: Secondary | ICD-10-CM

## 2023-11-25 DIAGNOSIS — Z7985 Long-term (current) use of injectable non-insulin antidiabetic drugs: Secondary | ICD-10-CM | POA: Diagnosis not present

## 2023-11-25 DIAGNOSIS — E669 Obesity, unspecified: Secondary | ICD-10-CM

## 2023-11-25 LAB — BAYER DCA HB A1C WAIVED: HB A1C (BAYER DCA - WAIVED): 13.7 % — ABNORMAL HIGH (ref 4.8–5.6)

## 2023-11-25 MED ORDER — FREESTYLE LIBRE 3 READER DEVI: 1.0000 | Freq: Four times a day (QID) | 1 refills | Status: AC

## 2023-11-25 MED ORDER — FREESTYLE LIBRE 3 PLUS SENSOR MISC: 11 refills | Status: DC

## 2023-11-25 MED ORDER — LANTUS SOLOSTAR 100 UNIT/ML ~~LOC~~ SOPN: 10.0000 [IU] | PEN_INJECTOR | Freq: Every day | SUBCUTANEOUS | 3 refills | Status: DC

## 2023-11-25 NOTE — Progress Notes (Signed)
BP 114/67   Pulse 68   Ht 5\' 11"  (1.803 m)   Wt 246 lb (111.6 kg)   SpO2 96%   BMI 34.31 kg/m    Subjective:   Patient ID: [name], male    DOB: 1951/07/28, 72 y.o.   MRN: [id number]  HPI: [name] is a 72 y.o. male presenting on 11/25/2023 for Medical Management of Chronic Issues, Diabetes, and Hypertension   HPI Type 2 diabetes mellitus Patient comes in today for recheck of his diabetes. Patient has been currently taking metformin and Ozempic. Patient is currently on an ACE inhibitor/ARB. Patient has seen an ophthalmologist this year. Patient denies any new issues with their feet. The symptom started onset as an adult hypertension and sleep apnea ARE RELATED TO DM   Hypertension Patient is currently on irbesartan, and their blood pressure today is 114/67. Patient denies any lightheadedness or dizziness. Patient denies headaches, blurred vision, chest pains, shortness of breath, or weakness. Denies any side effects from medication and is content with current medication.   Hyperlipidemia Patient is coming in for recheck of his hyperlipidemia. The patient is currently taking Crestor. They deny any issues with myalgias or history of liver damage from it. They deny any focal numbness or weakness or chest pain.   Relevant past medical, surgical, family and social history reviewed and updated as indicated. Interim medical history since our last visit reviewed. Allergies and medications reviewed and updated.  Review of Systems  Constitutional:  Negative for chills and fever.  Eyes:  Negative for visual disturbance.  Respiratory:  Negative for shortness of breath and wheezing.   Cardiovascular:  Negative for chest pain and leg swelling.  Musculoskeletal:  Negative for back pain and gait problem.  Skin:  Negative for rash.  Neurological:  Negative for dizziness, weakness and light-headedness.  All other systems reviewed and are negative.   Per HPI unless specifically  indicated above   Allergies as of 11/25/2023       Reactions   Sulfa Antibiotics    rash   Ramipril Cough        Medication List        Accurate as of November 25, 2023  9:01 AM. If you have any questions, ask your nurse or doctor.          acetaminophen 325 MG tablet Commonly known as: Tylenol Take 2 tablets (650 mg total) by mouth every 6 (six) hours as needed for moderate pain or headache.   aspirin EC 81 MG tablet Take 81 mg by mouth every evening.   ferrous sulfate 325 (65 FE) MG tablet Take 325 mg by mouth daily with breakfast.   gabapentin 100 MG capsule Commonly known as: NEURONTIN Take 2 capsules (200 mg total) by mouth at bedtime.   hydrOXYzine 25 MG tablet Commonly known as: ATARAX Take 1 tablet (25 mg total) by mouth as needed. Cuts in half as needed   irbesartan 150 MG tablet Commonly known as: AVAPRO Take 1 tablet (150 mg total) by mouth daily.   Lantus SoloStar 100 UNIT/ML Solostar Pen Generic drug: insulin glargine Inject 10 Units into the skin daily. Started by: Elige Radon Keelyn Monjaras   metFORMIN 1000 MG tablet Commonly known as: GLUCOPHAGE Take 1 tablet (1,000 mg total) by mouth 2 (two) times daily with a meal.   multivitamin with minerals tablet Take 1 tablet by mouth daily.   OneTouch Delica Lancets 33G Misc Use to check blood glucose once daily at varying times  OneTouch Verio test strip Generic drug: glucose blood Check BS once daily at varying times Dx E11.69   PROBIOTIC PO Take 1 capsule by mouth 3 (three) times a week.   rosuvastatin 5 MG tablet Commonly known as: CRESTOR Take 1 tablet (5 mg total) by mouth at bedtime.   Semaglutide (2 MG/DOSE) 8 MG/3ML Sopn Inject 2 mg as directed once a week.   tamsulosin 0.4 MG Caps capsule Commonly known as: FLOMAX Take 1 capsule (0.4 mg total) by mouth daily after supper.   traZODone 50 MG tablet Commonly known as: DESYREL Take 1-2 tablets (50-100 mg total) by mouth at bedtime  as needed for sleep (For insomnia as needed).         Objective:   BP 114/67   Pulse 68   Ht 5\' 11"  (1.803 m)   Wt 246 lb (111.6 kg)   SpO2 96%   BMI 34.31 kg/m   Wt Readings from Last 3 Encounters:  11/25/23 246 lb (111.6 kg)  11/17/23 252 lb 12.8 oz (114.7 kg)  09/21/23 270 lb (122.5 kg)    Physical Exam Vitals and nursing note reviewed.  Constitutional:      General: He is not in acute distress.    Appearance: He is well-developed. He is not diaphoretic.  Eyes:     General: No scleral icterus.    Conjunctiva/sclera: Conjunctivae normal.  Neck:     Thyroid: No thyromegaly.  Cardiovascular:     Rate and Rhythm: Normal rate and regular rhythm.     Heart sounds: Normal heart sounds. No murmur heard. Pulmonary:     Effort: Pulmonary effort is normal. No respiratory distress.     Breath sounds: Normal breath sounds. No wheezing.  Musculoskeletal:        General: No swelling. Normal range of motion.     Cervical back: Neck supple.  Lymphadenopathy:     Cervical: No cervical adenopathy.  Skin:    General: Skin is warm and dry.     Findings: No rash.  Neurological:     Mental Status: He is alert and oriented to person, place, and time.     Coordination: Coordination normal.  Psychiatric:        Behavior: Behavior normal.       Assessment & Plan:   Problem List Items Addressed This Visit       Cardiovascular and Mediastinum   Hypertension associated with diabetes (HCC)   Relevant Medications   insulin glargine (LANTUS SOLOSTAR) 100 UNIT/ML Solostar Pen   Other Relevant Orders   Bayer DCA Hb A1c Waived     Endocrine   Type 2 diabetes mellitus with obesity (HCC) - Primary   Relevant Medications   insulin glargine (LANTUS SOLOSTAR) 100 UNIT/ML Solostar Pen   Other Relevant Orders   Bayer DCA Hb A1c Waived     Other   Pure hypercholesterolemia   Relevant Orders   Bayer DCA Hb A1c Waived    He has done stomach issues with Ozempic but his diet is not  been as well and that may be playing a factor.  He says it was better this last week so he will continue the Ozempic, we will add Lantus and start him on 10 units daily and keep metformin for now 2.  He is going to drastically focus on dietary changes.  Will also set him up with our clinical pharmacist for more education. Follow up plan: Return in about 3 months (around 02/23/2024), or if symptoms  worsen or fail to improve, for Diabetes and hyperlipidemia.  Counseling provided for all of the vaccine components Orders Placed This Encounter  Procedures   Bayer DCA Hb A1c Waived    Arville Care, MD [facility] 11/25/2023, 9:01 AM

## 2023-11-25 NOTE — Addendum Note (Signed)
Addended by: Arville Care on: 11/25/2023 09:05 AM   Modules accepted: Orders

## 2023-11-26 ENCOUNTER — Telehealth: Payer: Self-pay | Admitting: Pharmacist

## 2023-11-26 NOTE — Telephone Encounter (Signed)
Please send me entire PAP for  Ozempic 2mg  weekly, Tresiba 20 units daily Thank you!

## 2023-12-02 ENCOUNTER — Other Ambulatory Visit: Payer: Medicare Other

## 2023-12-13 ENCOUNTER — Encounter (HOSPITAL_COMMUNITY): Payer: Self-pay | Admitting: Hematology

## 2023-12-13 ENCOUNTER — Other Ambulatory Visit (HOSPITAL_COMMUNITY): Payer: Self-pay

## 2023-12-13 ENCOUNTER — Telehealth: Payer: Self-pay

## 2023-12-13 NOTE — Progress Notes (Unsigned)
Pharmacy Medication Assistance Program Note    12/13/2023  Patient ID: [name], male   DOB: July 15, 1951, 72 y.o.   MRN: [id number]     12/13/2023  Outreach Medication One  Manufacturer Medication One [employer] [facility] Ozempic  Dose of Ozempic 2MG   Type of Radiographer, therapeutic Assistance  Date Application Sent to Prescriber 12/13/2023         12/13/2023  Outreach Medication Two  Manufacturer Medication Two [employer]  [employer] [facility] Tresiba  Dose of Tresiba U100  Type of Sport and exercise psychologist  Date Barista to Prescriber 12/13/2023    Equities trader to [employer]

## 2023-12-13 NOTE — Telephone Encounter (Signed)
Emailed to you :)  New encounter created.

## 2023-12-17 ENCOUNTER — Other Ambulatory Visit (HOSPITAL_COMMUNITY): Payer: Self-pay

## 2024-01-12 ENCOUNTER — Inpatient Hospital Stay: Payer: Medicare Other

## 2024-01-19 ENCOUNTER — Encounter (INDEPENDENT_AMBULATORY_CARE_PROVIDER_SITE_OTHER): Payer: Self-pay | Admitting: *Deleted

## 2024-01-28 ENCOUNTER — Other Ambulatory Visit: Payer: Self-pay | Admitting: Family Medicine

## 2024-01-28 DIAGNOSIS — R972 Elevated prostate specific antigen [PSA]: Secondary | ICD-10-CM

## 2024-01-28 DIAGNOSIS — E1169 Type 2 diabetes mellitus with other specified complication: Secondary | ICD-10-CM

## 2024-02-28 ENCOUNTER — Encounter: Payer: Self-pay | Admitting: Family Medicine

## 2024-02-28 ENCOUNTER — Ambulatory Visit (INDEPENDENT_AMBULATORY_CARE_PROVIDER_SITE_OTHER): Payer: Medicare Other | Admitting: Family Medicine

## 2024-02-28 VITALS — BP 117/61 | HR 70 | Temp 97.8°F | Ht 71.0 in | Wt 247.0 lb

## 2024-02-28 DIAGNOSIS — D751 Secondary polycythemia: Secondary | ICD-10-CM

## 2024-02-28 DIAGNOSIS — Z7984 Long term (current) use of oral hypoglycemic drugs: Secondary | ICD-10-CM | POA: Diagnosis not present

## 2024-02-28 DIAGNOSIS — E1169 Type 2 diabetes mellitus with other specified complication: Secondary | ICD-10-CM

## 2024-02-28 DIAGNOSIS — E1159 Type 2 diabetes mellitus with other circulatory complications: Secondary | ICD-10-CM

## 2024-02-28 DIAGNOSIS — G4709 Other insomnia: Secondary | ICD-10-CM | POA: Diagnosis not present

## 2024-02-28 DIAGNOSIS — E669 Obesity, unspecified: Secondary | ICD-10-CM

## 2024-02-28 DIAGNOSIS — E7800 Pure hypercholesterolemia, unspecified: Secondary | ICD-10-CM

## 2024-02-28 DIAGNOSIS — R972 Elevated prostate specific antigen [PSA]: Secondary | ICD-10-CM

## 2024-02-28 DIAGNOSIS — Z6834 Body mass index (BMI) 34.0-34.9, adult: Secondary | ICD-10-CM

## 2024-02-28 DIAGNOSIS — I152 Hypertension secondary to endocrine disorders: Secondary | ICD-10-CM | POA: Diagnosis not present

## 2024-02-28 LAB — BAYER DCA HB A1C WAIVED: HB A1C (BAYER DCA - WAIVED): 10.9 % — ABNORMAL HIGH (ref 4.8–5.6)

## 2024-02-28 LAB — LIPID PANEL

## 2024-02-28 MED ORDER — TRAZODONE HCL 50 MG PO TABS: 50.0000 mg | ORAL_TABLET | Freq: Every evening | ORAL | 3 refills | Status: DC | PRN

## 2024-02-28 MED ORDER — SEMAGLUTIDE (2 MG/DOSE) 8 MG/3ML ~~LOC~~ SOPN: 2.0000 mg | PEN_INJECTOR | SUBCUTANEOUS | 3 refills | Status: DC

## 2024-02-28 MED ORDER — TAMSULOSIN HCL 0.4 MG PO CAPS: 0.4000 mg | ORAL_CAPSULE | Freq: Every day | ORAL | 3 refills | Status: DC

## 2024-02-28 MED ORDER — METFORMIN HCL 1000 MG PO TABS: 1000.0000 mg | ORAL_TABLET | Freq: Two times a day (BID) | ORAL | 3 refills | Status: DC

## 2024-02-28 MED ORDER — GABAPENTIN 100 MG PO CAPS: 200.0000 mg | ORAL_CAPSULE | Freq: Every day | ORAL | 3 refills | Status: DC

## 2024-02-28 MED ORDER — IRBESARTAN 150 MG PO TABS: 150.0000 mg | ORAL_TABLET | Freq: Every day | ORAL | 3 refills | Status: DC

## 2024-02-28 MED ORDER — ROSUVASTATIN CALCIUM 5 MG PO TABS: 5.0000 mg | ORAL_TABLET | Freq: Every day | ORAL | 3 refills | Status: DC

## 2024-02-28 MED ORDER — HYDROXYZINE HCL 25 MG PO TABS: 25.0000 mg | ORAL_TABLET | ORAL | 3 refills | Status: DC | PRN

## 2024-02-28 NOTE — Progress Notes (Signed)
 BP 117/61   Pulse 70   Temp 97.8 F (36.6 C)   Ht 5\' 11"  (1.803 m)   Wt 247 lb (112 kg)   SpO2 96%   BMI 34.45 kg/m    Subjective:   Patient ID: [name], male    DOB: 1950-12-23, 73 y.o.   MRN: [id number]  HPI: [name] is a 73 y.o. male presenting on 02/28/2024 for Medical Management of Chronic Issues and Diabetes   HPI Type 2 diabetes mellitus Patient comes in today for recheck of his diabetes. Patient has been currently taking Ozempic and Lantus and metformin. Patient is currently on an ACE inhibitor/ARB. Patient has not seen an ophthalmologist this year. Patient denies any new issues with their feet. The symptom started onset as an adult hypertension and hyperlipidemia ARE RELATED TO DM   Hypertension Patient is currently on irbesartan, and their blood pressure today is 117/61. Patient denies any lightheadedness or dizziness. Patient denies headaches, blurred vision, chest pains, shortness of breath, or weakness. Denies any side effects from medication and is content with current medication.   Hyperlipidemia Patient is coming in for recheck of his hyperlipidemia. The patient is currently taking Crestor. They deny any issues with myalgias or history of liver damage from it. They deny any focal numbness or weakness or chest pain.   Relevant past medical, surgical, family and social history reviewed and updated as indicated. Interim medical history since our last visit reviewed. Allergies and medications reviewed and updated.  Review of Systems  Constitutional:  Negative for chills and fever.  Respiratory:  Negative for shortness of breath and wheezing.   Cardiovascular:  Negative for chest pain and leg swelling.  Musculoskeletal:  Negative for back pain and gait problem.  Skin:  Negative for rash.  Neurological:  Negative for dizziness, weakness and light-headedness.  All other systems reviewed and are negative.   Per HPI unless specifically indicated  above   Allergies as of 02/28/2024       Reactions   Sulfa Antibiotics    rash   Ramipril Cough        Medication List        Accurate as of February 28, 2024  8:29 AM. If you have any questions, ask your nurse or doctor.          acetaminophen 325 MG tablet Commonly known as: Tylenol Take 2 tablets (650 mg total) by mouth every 6 (six) hours as needed for moderate pain or headache.   aspirin EC 81 MG tablet Take 81 mg by mouth every evening.   ferrous sulfate 325 (65 FE) MG tablet Take 325 mg by mouth daily with breakfast.   FreeStyle Libre 3 Plus Sensor Misc Change sensor every 15 days.   FreeStyle Libre 3 Reader Devi 1 each by Does not apply route 4 (four) times daily.   gabapentin 100 MG capsule Commonly known as: NEURONTIN Take 2 capsules (200 mg total) by mouth at bedtime.   hydrOXYzine 25 MG tablet Commonly known as: ATARAX Take 1 tablet (25 mg total) by mouth as needed. Cuts in half as needed   irbesartan 150 MG tablet Commonly known as: AVAPRO Take 1 tablet (150 mg total) by mouth daily.   Lantus SoloStar 100 UNIT/ML Solostar Pen Generic drug: insulin glargine Inject 10 Units into the skin daily.   metFORMIN 1000 MG tablet Commonly known as: GLUCOPHAGE Take 1 tablet (1,000 mg total) by mouth 2 (two) times daily with a meal.  multivitamin with minerals tablet Take 1 tablet by mouth daily.   OneTouch Delica Lancets 33G Misc Use to check blood glucose once daily at varying times   OneTouch Verio test strip Generic drug: glucose blood Check BS once daily at varying times Dx E11.69   PROBIOTIC PO Take 1 capsule by mouth 3 (three) times a week.   rosuvastatin 5 MG tablet Commonly known as: CRESTOR Take 1 tablet (5 mg total) by mouth at bedtime.   Semaglutide (2 MG/DOSE) 8 MG/3ML Sopn Inject 2 mg as directed once a week.   tamsulosin 0.4 MG Caps capsule Commonly known as: FLOMAX Take 1 capsule (0.4 mg total) by mouth daily after  supper. What changed: See the new instructions. Changed by: Elige Radon Veleda Mun   traZODone 50 MG tablet Commonly known as: DESYREL Take 1-2 tablets (50-100 mg total) by mouth at bedtime as needed for sleep (For insomnia as needed).         Objective:   BP 117/61   Pulse 70   Temp 97.8 F (36.6 C)   Ht 5\' 11"  (1.803 m)   Wt 247 lb (112 kg)   SpO2 96%   BMI 34.45 kg/m   Wt Readings from Last 3 Encounters:  02/28/24 247 lb (112 kg)  11/25/23 246 lb (111.6 kg)  11/17/23 252 lb 12.8 oz (114.7 kg)    Physical Exam Vitals and nursing note reviewed.  Constitutional:      General: He is not in acute distress.    Appearance: He is well-developed. He is not diaphoretic.  Eyes:     General: No scleral icterus.    Conjunctiva/sclera: Conjunctivae normal.  Neck:     Thyroid: No thyromegaly.  Cardiovascular:     Rate and Rhythm: Normal rate and regular rhythm.     Heart sounds: Normal heart sounds. No murmur heard. Pulmonary:     Effort: Pulmonary effort is normal. No respiratory distress.     Breath sounds: Normal breath sounds. No wheezing.  Musculoskeletal:        General: Normal range of motion.     Cervical back: Neck supple.  Lymphadenopathy:     Cervical: No cervical adenopathy.  Skin:    General: Skin is warm and dry.     Findings: No rash.  Neurological:     Mental Status: He is alert and oriented to person, place, and time.     Coordination: Coordination normal.  Psychiatric:        Behavior: Behavior normal.       Assessment & Plan:   Problem List Items Addressed This Visit       Cardiovascular and Mediastinum   Hypertension associated with diabetes (HCC)   Relevant Medications   irbesartan (AVAPRO) 150 MG tablet   metFORMIN (GLUCOPHAGE) 1000 MG tablet   rosuvastatin (CRESTOR) 5 MG tablet   Semaglutide, 2 MG/DOSE, 8 MG/3ML SOPN     Endocrine   Type 2 diabetes mellitus with obesity (HCC) - Primary   Relevant Medications   gabapentin  (NEURONTIN) 100 MG capsule   hydrOXYzine (ATARAX) 25 MG tablet   irbesartan (AVAPRO) 150 MG tablet   metFORMIN (GLUCOPHAGE) 1000 MG tablet   rosuvastatin (CRESTOR) 5 MG tablet   Semaglutide, 2 MG/DOSE, 8 MG/3ML SOPN   Other Relevant Orders   CBC with Differential/Platelet   CMP14+EGFR   Lipid panel   Bayer DCA Hb A1c Waived     Other   Elevated PSA   Relevant Medications   tamsulosin (  FLOMAX) 0.4 MG CAPS capsule   Pure hypercholesterolemia   Relevant Medications   irbesartan (AVAPRO) 150 MG tablet   rosuvastatin (CRESTOR) 5 MG tablet   Other Relevant Orders   CBC with Differential/Platelet   CMP14+EGFR   Lipid panel   Bayer DCA Hb A1c Waived   Polycythemia, secondary   Other Visit Diagnoses       Other insomnia       Relevant Medications   traZODone (DESYREL) 50 MG tablet     A1c is 10.9 which is better than last time, will have him increase his Lantus to 12 units daily and encourage focusing on diet. Blood pressure and everything else looks good today. Follow up plan: Return in about 3 months (around 05/30/2024), or if symptoms worsen or fail to improve, for Diabetes recheck.  Counseling provided for all of the vaccine components Orders Placed This Encounter  Procedures   CBC with Differential/Platelet   CMP14+EGFR   Lipid panel   Bayer DCA Hb A1c Waived    Arville Care, MD [facility] 02/28/2024, 8:29 AM

## 2024-02-29 LAB — CBC WITH DIFFERENTIAL/PLATELET
Basophils Absolute: 0.1 10*3/uL (ref 0.0–0.2)
Basos: 1 %
EOS (ABSOLUTE): 0.2 10*3/uL (ref 0.0–0.4)
Eos: 3 %
Hematocrit: 44.7 % (ref 37.5–51.0)
Hemoglobin: 13.8 g/dL (ref 13.0–17.7)
Immature Grans (Abs): 0 10*3/uL (ref 0.0–0.1)
Immature Granulocytes: 0 %
Lymphocytes Absolute: 1.3 10*3/uL (ref 0.7–3.1)
Lymphs: 22 %
MCH: 24.2 pg — ABNORMAL LOW (ref 26.6–33.0)
MCHC: 30.9 g/dL — ABNORMAL LOW (ref 31.5–35.7)
MCV: 78 fL — ABNORMAL LOW (ref 79–97)
Monocytes Absolute: 0.5 10*3/uL (ref 0.1–0.9)
Monocytes: 8 %
Neutrophils Absolute: 3.9 10*3/uL (ref 1.4–7.0)
Neutrophils: 66 %
Platelets: 267 10*3/uL (ref 150–450)
RBC: 5.71 x10E6/uL (ref 4.14–5.80)
RDW: 16.6 % — ABNORMAL HIGH (ref 11.6–15.4)
WBC: 5.9 10*3/uL (ref 3.4–10.8)

## 2024-02-29 LAB — LIPID PANEL
Cholesterol, Total: 90 mg/dL — ABNORMAL LOW (ref 100–199)
HDL: 35 mg/dL — ABNORMAL LOW (ref >39)
LDL CALC COMMENT:: 2.6 ratio (ref 0.0–5.0)
LDL Chol Calc (NIH): 37 mg/dL (ref 0–99)
Triglycerides: 90 mg/dL (ref 0–149)
VLDL Cholesterol Cal: 18 mg/dL (ref 5–40)

## 2024-02-29 LAB — CMP14+EGFR
ALT: 15 IU/L (ref 0–44)
AST: 22 IU/L (ref 0–40)
Albumin: 4.3 g/dL (ref 3.8–4.8)
Alkaline Phosphatase: 83 IU/L (ref 44–121)
BUN/Creatinine Ratio: 9 — ABNORMAL LOW (ref 10–24)
BUN: 12 mg/dL (ref 8–27)
Bilirubin Total: 0.9 mg/dL (ref 0.0–1.2)
CO2: 20 mmol/L (ref 20–29)
Calcium: 9.4 mg/dL (ref 8.6–10.2)
Chloride: 101 mmol/L (ref 96–106)
Creatinine, Ser: 1.29 mg/dL — ABNORMAL HIGH (ref 0.76–1.27)
Globulin, Total: 2.8 g/dL (ref 1.5–4.5)
Glucose: 159 mg/dL — ABNORMAL HIGH (ref 70–99)
Potassium: 4.3 mmol/L (ref 3.5–5.2)
Sodium: 136 mmol/L (ref 134–144)
Total Protein: 7.1 g/dL (ref 6.0–8.5)
eGFR: 59 mL/min/{1.73_m2} — ABNORMAL LOW (ref >59)

## 2024-03-02 ENCOUNTER — Encounter: Payer: Self-pay | Admitting: Family Medicine

## 2024-03-07 ENCOUNTER — Inpatient Hospital Stay: Payer: Medicare Other

## 2024-03-07 ENCOUNTER — Inpatient Hospital Stay: Payer: Medicare Other | Attending: Hematology

## 2024-03-07 DIAGNOSIS — D751 Secondary polycythemia: Secondary | ICD-10-CM | POA: Insufficient documentation

## 2024-03-07 LAB — CBC
HCT: 41.3 % (ref 39.0–52.0)
Hemoglobin: 13 g/dL (ref 13.0–17.0)
MCH: 24.7 pg — ABNORMAL LOW (ref 26.0–34.0)
MCHC: 31.5 g/dL (ref 30.0–36.0)
MCV: 78.5 fL — ABNORMAL LOW (ref 80.0–100.0)
Platelets: 262 10*3/uL (ref 150–400)
RBC: 5.26 MIL/uL (ref 4.22–5.81)
RDW: 16.8 % — ABNORMAL HIGH (ref 11.5–15.5)
WBC: 5.7 10*3/uL (ref 4.0–10.5)
nRBC: 0 % (ref 0.0–0.2)

## 2024-03-07 NOTE — Progress Notes (Signed)
 [name] presents today for phlebotomy per MD orders. Phlebotomy procedure started at 1408 and ended at 1413. 500 grams removed. Patient refused to stay for post wait time.  Patient tolerated procedure well. IV needle removed intact.

## 2024-03-07 NOTE — Patient Instructions (Signed)
 [facility] [place] [facility]  Discharge Instructions: Thank you for choosing [facility] to provide your oncology and hematology care.  If you have a lab appointment with the Cancer Center - please note that after April 8th, 2024, all labs will be drawn in the cancer center.  You do not have to check in or register with the main entrance as you have in the past but will complete your check-in in the cancer center.  Wear comfortable clothing and clothing appropriate for easy access to any Portacath or PICC line.   We strive to give you quality time with your provider. You may need to reschedule your appointment if you arrive late (15 or more minutes).  Arriving late affects you and other patients whose appointments are after yours.  Also, if you miss three or more appointments without notifying the office, you may be dismissed from the clinic at the provider's discretion.      For prescription refill requests, have your pharmacy contact our office and allow 72 hours for refills to be completed.    Today you received the following phlebotomy.    To help prevent nausea and vomiting after your treatment, we encourage you to take your nausea medication as directed.  BELOW ARE SYMPTOMS THAT SHOULD BE REPORTED IMMEDIATELY: *FEVER GREATER THAN 100.4 F (38 C) OR HIGHER *CHILLS OR SWEATING *NAUSEA AND VOMITING THAT IS NOT CONTROLLED WITH YOUR NAUSEA MEDICATION *UNUSUAL SHORTNESS OF BREATH *UNUSUAL BRUISING OR BLEEDING *URINARY PROBLEMS (pain or burning when urinating, or frequent urination) *BOWEL PROBLEMS (unusual diarrhea, constipation, pain near the anus) TENDERNESS IN MOUTH AND THROAT WITH OR WITHOUT PRESENCE OF ULCERS (sore throat, sores in mouth, or a toothache) UNUSUAL RASH, SWELLING OR PAIN  UNUSUAL VAGINAL DISCHARGE OR ITCHING   Items with * indicate a potential emergency and should be followed up as soon as possible or go to the  Emergency Department if any problems should occur.  Please show the CHEMOTHERAPY ALERT CARD or IMMUNOTHERAPY ALERT CARD at check-in to the Emergency Department and triage nurse.  Should you have questions after your visit or need to cancel or reschedule your appointment, please contact [facility] [place] - A DEPT OF Eligha Brideg[facility] [phone]  and follow the prompts.  Office hours are 8:00 a.m. to 4:30 p.m. Monday - Friday. Please note that voicemails left after 4:00 p.m. may not be returned until the following business day.  We are closed weekends and major holidays. You have access to a nurse at all times for urgent questions. Please call the main number to the clinic [phone] and follow the prompts.  For any non-urgent questions, you may also contact your provider using MyChart. We now offer e-Visits for anyone 31 and older to request care online for non-urgent symptoms. For details visit [email or web address].   Also download the MyChart app! Go to the app store, search "MyChart", open the app, select [place], and log in with your MyChart username and password.

## 2024-03-08 ENCOUNTER — Inpatient Hospital Stay: Payer: Medicare Other
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
## 2024-05-03 ENCOUNTER — Other Ambulatory Visit: Payer: Medicare Other

## 2024-05-03 ENCOUNTER — Ambulatory Visit: Payer: Medicare Other | Admitting: Physician Assistant

## 2024-05-11 ENCOUNTER — Inpatient Hospital Stay: Payer: Medicare Other | Attending: Hematology

## 2024-05-11 ENCOUNTER — Inpatient Hospital Stay: Payer: Medicare Other | Admitting: Oncology

## 2024-05-11 ENCOUNTER — Inpatient Hospital Stay: Payer: Medicare Other

## 2024-05-11 VITALS — BP 120/83 | HR 56 | Temp 98.2°F | Resp 18 | Wt 256.2 lb

## 2024-05-11 DIAGNOSIS — Z148 Genetic carrier of other disease: Secondary | ICD-10-CM | POA: Diagnosis not present

## 2024-05-11 DIAGNOSIS — G4733 Obstructive sleep apnea (adult) (pediatric): Secondary | ICD-10-CM | POA: Diagnosis not present

## 2024-05-11 DIAGNOSIS — D751 Secondary polycythemia: Secondary | ICD-10-CM | POA: Insufficient documentation

## 2024-05-11 DIAGNOSIS — R972 Elevated prostate specific antigen [PSA]: Secondary | ICD-10-CM | POA: Insufficient documentation

## 2024-05-11 LAB — CBC WITH DIFFERENTIAL/PLATELET
Abs Immature Granulocytes: 0.03 10*3/uL (ref 0.00–0.07)
Basophils Absolute: 0.1 10*3/uL (ref 0.0–0.1)
Basophils Relative: 1 %
Eosinophils Absolute: 0.2 10*3/uL (ref 0.0–0.5)
Eosinophils Relative: 3 %
HCT: 40.2 % (ref 39.0–52.0)
Hemoglobin: 12.4 g/dL — ABNORMAL LOW (ref 13.0–17.0)
Immature Granulocytes: 0 %
Lymphocytes Relative: 22 %
Lymphs Abs: 1.6 10*3/uL (ref 0.7–4.0)
MCH: 24.5 pg — ABNORMAL LOW (ref 26.0–34.0)
MCHC: 30.8 g/dL (ref 30.0–36.0)
MCV: 79.3 fL — ABNORMAL LOW (ref 80.0–100.0)
Monocytes Absolute: 0.6 10*3/uL (ref 0.1–1.0)
Monocytes Relative: 8 %
Neutro Abs: 4.8 10*3/uL (ref 1.7–7.7)
Neutrophils Relative %: 66 %
Platelets: 271 10*3/uL (ref 150–400)
RBC: 5.07 MIL/uL (ref 4.22–5.81)
RDW: 16.1 % — ABNORMAL HIGH (ref 11.5–15.5)
WBC: 7.2 10*3/uL (ref 4.0–10.5)
nRBC: 0 % (ref 0.0–0.2)

## 2024-05-11 NOTE — Progress Notes (Signed)
 [facility] [address], [place] [address]   CLINIC:  Medical Oncology/Hematology  PCP:  Dettinger, Lucio Sabin, MD [address] [place] [address] [phone]   REASON FOR VISIT:  Follow-up for secondary polycythemia   PRIOR THERAPY: None   CURRENT THERAPY: Intermittent phlebotomies, last on 09/23/2023  INTERVAL HISTORY:   [name] 73 y.o. male returns for routine follow-up of secondary polycythemia.  He was last seen by Sheril Dines PA-C on 11/17/23.    He last had a phlebotomy on 03/07/2024.   Reports today, feeling really well.  He has trouble falling asleep but this is a chronic issue.  He has 6/10 knee pain bilaterally.  Appetite is 100% energy levels are 80%.  He denies excessively feeling hot or increased thirst which are symptoms that he may need a phlebotomy.   No recent hospitalizations, surgeries, or changes in baseline health status. He has been tolerating his phlebotomies well.  He reports that when he misses phlebotomy, he has increased symptoms of thirst, burning tongue, and excessive heat.   No erythromelalgia, aquagenic pruritus, Raynaud's, or vasomotor symptoms.   No fever, chills, or unintentional weight loss.   He remains compliant with CPAP.   He is taking oral ferrous sulfate once daily.   ASSESSMENT & PLAN:  1.  Secondary polycythemia (JAK2 negative): - Patient tested NEGATIVE for JAK2 mutations, CALR, and MPL. - Erythropoietin  level was normal at 10 - Other work-up was normal (BCR/ABL, HIV, SPEP, ANA, CRP, RF) - He has sleep apnea and uses CPAP machine.  Lifelong non-smoker. - He started phlebotomies in February 2020, averaging phlebotomy every 6 to 8 weeks. - Most recent phlebotomy on 02/16/24. - Reports excessive heat, night sweats, burning tongue, and thirst when his Hgb/HCT is elevated (>40) - Denies aquagenic pruritus or other vasomotor symptoms.     - Patient reports that he feels better when HCT < 40.  He "felt  terrible" when we tried to stretch phlebotomy to every 3 months.   2.  Sleep apnea - Patient reports compliance with CPAP nightly  3.  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 - Significant iron deficiency noted in January 2023 with ferritin 7, iron saturation 7%, and TIBC 464 - He has been taking ferrous sulfate 325 mg daily since January 2023 - Most recent iron panel (11/17/2023): Ferritin 10, iron saturation 12%.     4.   Health Maintenance & Other: - Patient had a colonoscopy on 02/07/2019 that showed a 5 mm polyp in the ascending colon that showed tubular adenoma.  Diverticulosis in the rectosigmoid colon and the ascending colon.  Torturous colon.  External and internal hemorrhoids. - Elevated PSA:  Patient has a PSA of 9.2, followed by [facility].  Prostate biopsy on 03/30/2017 with 12 cores submitted that were benign. - Positive hep B serology:  Patient has positive hep B surface antibody and positive hep B core antibody. Surface antigen and IgM are negative.  Hep C and HIV are negative. Patient is immune based on prior exposure.  PLAN: 1. Polycythemia, secondary (Primary) - Differential diagnosis favors secondary polycythemia in the setting of OSA, but it is also possible that he may have triple negative primary polycythemia - Labs today (05/11/24): Hgb 12.4/hematocrit 40.2.   - No phlebotomy today.  We discussed waiting another 6 weeks and checking labs then.  He knows he can return to  clinic should he develop -Continue CBC and phlebotomy every 8 weeks if HCT >40.  Office visit in 24 weeks. - Would consider bone marrow biopsy if he had any major changes in his blood counts.  Patient declines bone marrow biopsy at this time.  2. Hemochromatosis carrier - Continue with phlebotomy for polycythemia as above, will periodically check iron (annually). - Management of iron deficiency in the  setting of secondary polycythemia is poorly defined, but it is reasonable to consider combination of phlebotomy in addition to oral iron therapy in order to improve iron stores while safely maintaining an asymptomatic hematocrit level. - Continue taking ferrous sulfate 325 mg OTC daily.  3. OSA on CPAP - PLAN: Continue CPAP   PLAN SUMMARY:  >> CBC + phlebotomy every 6-8 weeks (proceed with phlebotomy if HCT >40) >> Same-day labs (CBC/D) + office visit + phlebotomy in 6 months >> No phlebotomy today.     REVIEW OF SYSTEMS:   Review of Systems  Musculoskeletal:  Positive for arthralgias.     PHYSICAL EXAM:  ECOG PERFORMANCE STATUS: 1 - Symptomatic but completely ambulatory  There were no vitals filed for this visit.  There were no vitals filed for this visit.  Physical Exam Constitutional:      Appearance: Normal appearance.  Cardiovascular:     Rate and Rhythm: Normal rate and regular rhythm.  Pulmonary:     Effort: Pulmonary effort is normal.     Breath sounds: Normal breath sounds.  Abdominal:     General: Bowel sounds are normal.     Palpations: Abdomen is soft.  Musculoskeletal:        General: No swelling. Normal range of motion.  Neurological:     Mental Status: He is alert and oriented to person, place, and time. Mental status is at baseline.     PAST MEDICAL/SURGICAL HISTORY:  Past Medical History:  Diagnosis Date   Diabetes mellitus without complication (HCC)    GERD (gastroesophageal reflux disease)    Hx of [place] spotted fever [id number]   Hypertension    Sleep apnea    wears cpap    Past Surgical History:  Procedure Laterality Date   COLONOSCOPY  09/2012   Dr. Alline Ivans. Diverticulosis, descending adenomatous colon polyp, hyperplastic rectal polyp. ?mass in anal canal evaluated by surgery and consistent with internal hemorrhoid   COLONOSCOPY WITH PROPOFOL  N/A 02/07/2019   Procedure: COLONOSCOPY WITH PROPOFOL ;  Surgeon: Alyce Jubilee, MD;   [facility];  Service: Endoscopy;  Laterality: N/A;  9:30am   KNEE SURGERY Left 10/2014   KNEE SURGERY Right 09/2015   POLYPECTOMY  02/07/2019   Procedure: POLYPECTOMY;  Surgeon: Alyce Jubilee, MD;  Location: [facility];  Service: Endoscopy;;    SOCIAL HISTORY:  Social History   Socioeconomic History   Marital status: Single    Spouse name: Not on file   Number of children: 0   Years of education: 12   Highest education level: 12th grade  Occupational History   Occupation: Retired    Comment: [employer]  Tobacco Use   Smoking status: Never   Smokeless tobacco: Never  Vaping Use   Vaping status: Never Used  Substance and Sexual Activity   Alcohol use: No   Drug use: No   Sexual activity: Not Currently  Other Topics Concern   Not on file  Social History Narrative   WORKED AT [employer] FOR > 10 YRS. NOT MARRIED. FUR BABIES:  3 DOGS AND 5 CATS. SPENDS FREE TIME: GARDENS, BEE KEEPER, WALKS.   Social Drivers of Corporate investment banker Strain: Low Risk  (02/24/2024)   Overall Financial Resource Strain (CARDIA)    Difficulty of Paying Living Expenses: Not hard at all  Food Insecurity: No Food Insecurity (02/24/2024)   Hunger Vital Sign    Worried About Running Out of Food in the Last Year: Never true    Ran Out of Food in the Last Year: Never true  Transportation Needs: No Transportation Needs (02/24/2024)   PRAPARE - Administrator, Civil Service (Medical): No    Lack of Transportation (Non-Medical): No  Physical Activity: Sufficiently Active (02/24/2024)   Exercise Vital Sign    Days of Exercise per Week: 3 days    Minutes of Exercise per Session: 60 min  Stress: No Stress Concern Present (02/24/2024)   [employer] of Occupational Health - Occupational Stress Questionnaire    Feeling of Stress : Not at all  Social Connections: Moderately Integrated (02/24/2024)   Social Connection and Isolation Panel [NHANES]     Frequency of Communication with Friends and Family: More than three times a week    Frequency of Social Gatherings with Friends and Family: Once a week    Attends Religious Services: More than 4 times per year    Active Member of [employer] or Organizations: Yes    Attends Engineer, structural: More than 4 times per year    Marital Status: Never married  Intimate Partner Violence: Not At Risk (09/21/2023)   Humiliation, Afraid, Rape, and Kick questionnaire    Fear of Current or Ex-Partner: No    Emotionally Abused: No    Physically Abused: No    Sexually Abused: No    FAMILY HISTORY:  Family History  Problem Relation Age of Onset   Heart disease Mother    Congestive Heart Failure Mother    Diabetes Father    Aneurysm Sister    Diabetes Paternal Uncle    Diabetes Paternal Grandmother    Colon cancer Neg Hx    Colon polyps Neg Hx     CURRENT MEDICATIONS:  Outpatient Encounter Medications as of 05/11/2024  Medication Sig   acetaminophen  (TYLENOL ) 325 MG tablet Take 2 tablets (650 mg total) by mouth every 6 (six) hours as needed for moderate pain or headache.   aspirin EC 81 MG tablet Take 81 mg by mouth every evening.   Continuous Glucose Receiver (FREESTYLE LIBRE 3 READER) DEVI 1 each by Does not apply route 4 (four) times daily.   Continuous Glucose Sensor (FREESTYLE LIBRE 3 PLUS SENSOR) MISC Change sensor every 15 days.   ferrous sulfate 325 (65 FE) MG tablet Take 325 mg by mouth daily with breakfast.   gabapentin  (NEURONTIN ) 100 MG capsule Take 2 capsules (200 mg total) by mouth at bedtime.   glucose blood (ONETOUCH VERIO) test strip Check BS once daily at varying times Dx E11.69   hydrOXYzine  (ATARAX ) 25 MG tablet Take 1 tablet (25 mg total) by mouth as needed. Cuts in half as needed   insulin glargine  (LANTUS  SOLOSTAR) 100 UNIT/ML Solostar Pen Inject 10 Units into the skin daily.   irbesartan  (AVAPRO ) 150 MG tablet Take 1 tablet (150 mg total) by mouth daily.   metFORMIN   (GLUCOPHAGE ) 1000 MG tablet Take 1 tablet (1,000 mg total) by mouth 2 (two) times daily with a meal.   Multiple Vitamins-Minerals (MULTIVITAMIN WITH MINERALS) tablet Take 1 tablet by  mouth daily.   ONETOUCH DELICA LANCETS 33G MISC Use to check blood glucose once daily at varying times   Probiotic Product (PROBIOTIC PO) Take 1 capsule by mouth 3 (three) times a week.    rosuvastatin  (CRESTOR ) 5 MG tablet Take 1 tablet (5 mg total) by mouth at bedtime.   Semaglutide , 2 MG/DOSE, 8 MG/3ML SOPN Inject 2 mg as directed once a week.   tamsulosin  (FLOMAX ) 0.4 MG CAPS capsule Take 1 capsule (0.4 mg total) by mouth daily after supper.   traZODone  (DESYREL ) 50 MG tablet Take 1-2 tablets (50-100 mg total) by mouth at bedtime as needed for sleep (For insomnia as needed).   No facility-administered encounter medications on file as of 05/11/2024.    ALLERGIES:  Allergies  Allergen Reactions   Sulfa  Antibiotics     rash   Ramipril  Cough    LABORATORY DATA:  I have reviewed the labs as listed.  CBC    Component Value Date/Time   WBC 7.2 05/11/2024 1258   RBC 5.07 05/11/2024 1258   HGB 12.4 (L) 05/11/2024 1258   HGB 13.8 02/28/2024 0801   HCT 40.2 05/11/2024 1258   HCT 44.7 02/28/2024 0801   PLT 271 05/11/2024 1258   PLT 267 02/28/2024 0801   MCV 79.3 (L) 05/11/2024 1258   MCV 78 (L) 02/28/2024 0801   MCH 24.5 (L) 05/11/2024 1258   MCHC 30.8 05/11/2024 1258   RDW 16.1 (H) 05/11/2024 1258   RDW 16.6 (H) 02/28/2024 0801   LYMPHSABS 1.6 05/11/2024 1258   LYMPHSABS 1.3 02/28/2024 0801   MONOABS 0.6 05/11/2024 1258   EOSABS 0.2 05/11/2024 1258   EOSABS 0.2 02/28/2024 0801   BASOSABS 0.1 05/11/2024 1258   BASOSABS 0.1 02/28/2024 0801      Latest Ref Rng & Units 02/28/2024    8:01 AM 08/26/2023   10:55 AM 01/28/2023    8:04 AM  CMP  Glucose 70 - 99 mg/dL 161  096  045   BUN 8 - 27 mg/dL 12  17  17    Creatinine 0.76 - 1.27 mg/dL 4.09  8.11  9.14   Sodium 134 - 144 mmol/L 136  136  138    Potassium 3.5 - 5.2 mmol/L 4.3  4.7  4.4   Chloride 96 - 106 mmol/L 101  101  102   CO2 20 - 29 mmol/L 20  21  20    Calcium  8.6 - 10.2 mg/dL 9.4  9.2  9.3   Total Protein 6.0 - 8.5 g/dL 7.1  6.8  6.9   Total Bilirubin 0.0 - 1.2 mg/dL 0.9  0.6  0.6   Alkaline Phos 44 - 121 IU/L 83  91  82   AST 0 - 40 IU/L 22  20  23    ALT 0 - 44 IU/L 15  21  21      DIAGNOSTIC IMAGING:  I have independently reviewed the relevant imaging and discussed with the patient.   WRAP UP:  All questions were answered. The patient knows to call the clinic with any problems, questions or concerns.  Medical decision making: Low  Time spent on visit: I spent 15 minutes counseling the patient face to face. The total time spent in the appointment was 22 minutes and more than 50% was on counseling.  Aurther Blue, NP  05/11/24 2:22 PM

## 2024-05-11 NOTE — Progress Notes (Signed)
 Patient presents today for possible phlebotomy and office visit with Augustin Bloch NP. Vital signs stable. HCT today 40.2. Per Augustin Bloch NP notes patient will receive phlebotomy if HCT > 40.   Message received from J.Burns NP. NO phlebotomy today.

## 2024-06-19 ENCOUNTER — Ambulatory Visit: Payer: Self-pay | Admitting: Family Medicine

## 2024-06-19 ENCOUNTER — Encounter: Payer: Self-pay | Admitting: Family Medicine

## 2024-06-19 ENCOUNTER — Ambulatory Visit (INDEPENDENT_AMBULATORY_CARE_PROVIDER_SITE_OTHER): Admitting: Family Medicine

## 2024-06-19 VITALS — BP 138/84 | HR 53 | Ht 71.0 in | Wt 253.0 lb

## 2024-06-19 DIAGNOSIS — E669 Obesity, unspecified: Secondary | ICD-10-CM | POA: Diagnosis not present

## 2024-06-19 DIAGNOSIS — E1169 Type 2 diabetes mellitus with other specified complication: Secondary | ICD-10-CM | POA: Diagnosis not present

## 2024-06-19 DIAGNOSIS — Z7984 Long term (current) use of oral hypoglycemic drugs: Secondary | ICD-10-CM

## 2024-06-19 DIAGNOSIS — E1159 Type 2 diabetes mellitus with other circulatory complications: Secondary | ICD-10-CM

## 2024-06-19 DIAGNOSIS — Z794 Long term (current) use of insulin: Secondary | ICD-10-CM

## 2024-06-19 DIAGNOSIS — I152 Hypertension secondary to endocrine disorders: Secondary | ICD-10-CM

## 2024-06-19 DIAGNOSIS — E7800 Pure hypercholesterolemia, unspecified: Secondary | ICD-10-CM | POA: Diagnosis not present

## 2024-06-19 LAB — BAYER DCA HB A1C WAIVED: HB A1C (BAYER DCA - WAIVED): 9.8 % — ABNORMAL HIGH (ref 4.8–5.6)

## 2024-06-19 NOTE — Progress Notes (Signed)
 BP 138/84   Pulse (!) 53   Ht 5' 11 (1.803 m)   Wt 253 lb (114.8 kg)   SpO2 96%   BMI 35.29 kg/m    Subjective:   Patient ID: [name], male    DOB: 16-Jan-1951, 73 y.o.   MRN: [id number]  HPI: [name] is a 73 y.o. male presenting on 06/19/2024 for Medical Management of Chronic Issues, Hospitalization Follow-up, and Hypertension   HPI Type 2 diabetes mellitus Patient comes in today for recheck of his diabetes. Patient has been currently taking metformin  and Lantus , stopped his Ozempic  a few weeks ago because he was continuing to have diarrhea even on the lower dose.. Patient is currently on an ACE inhibitor/ARB. Patient has seen an ophthalmologist this year. Patient denies any new issues with their feet. The symptom started onset as an adult hypertension and hyperlipidemia ARE RELATED TO DM   Hypertension Patient is currently on irbesartan , and their blood pressure today is 138/84. Patient denies any lightheadedness or dizziness. Patient denies headaches, blurred vision, chest pains, shortness of breath, or weakness. Denies any side effects from medication and is content with current medication.   Hyperlipidemia Patient is coming in for recheck of his hyperlipidemia. The patient is currently taking rosuvastatin . They deny any issues with myalgias or history of liver damage from it. They deny any focal numbness or weakness or chest pain.   Relevant past medical, surgical, family and social history reviewed and updated as indicated. Interim medical history since our last visit reviewed. Allergies and medications reviewed and updated.  Review of Systems  Constitutional:  Negative for chills and fever.  Eyes:  Negative for visual disturbance.  Respiratory:  Negative for shortness of breath and wheezing.   Cardiovascular:  Negative for chest pain and leg swelling.  Musculoskeletal:  Negative for back pain and gait problem.  Skin:  Negative for rash.  Neurological:   Negative for dizziness and light-headedness.  All other systems reviewed and are negative.   Per HPI unless specifically indicated above   Allergies as of 06/19/2024       Reactions   Sulfa  Antibiotics    rash   Ramipril  Cough        Medication List        Accurate as of June 19, 2024  8:25 AM. If you have any questions, ask your nurse or doctor.          STOP taking these medications    OneTouch Delica Lancets 33G Misc Stopped by: Fonda LABOR Frederik Standley   OneTouch Verio test strip Generic drug: glucose blood Stopped by: Fonda LABOR Cerys Winget   Semaglutide  (2 MG/DOSE) 8 MG/3ML Sopn Stopped by: Fonda LABOR Noralee Dutko       TAKE these medications    acetaminophen  325 MG tablet Commonly known as: Tylenol  Take 2 tablets (650 mg total) by mouth every 6 (six) hours as needed for moderate pain or headache.   aspirin EC 81 MG tablet Take 81 mg by mouth every evening.   ferrous sulfate 325 (65 FE) MG tablet Take 325 mg by mouth daily with breakfast.   FreeStyle Libre 3 Plus Sensor Misc Change sensor every 15 days.   FreeStyle Libre 3 Reader Devi 1 each by Does not apply route 4 (four) times daily.   gabapentin  100 MG capsule Commonly known as: NEURONTIN  Take 2 capsules (200 mg total) by mouth at bedtime.   hydrOXYzine  25 MG tablet Commonly known as: ATARAX  Take 1 tablet (25 mg  total) by mouth as needed. Cuts in half as needed   irbesartan  150 MG tablet Commonly known as: AVAPRO  Take 1 tablet (150 mg total) by mouth daily.   Lantus  SoloStar 100 UNIT/ML Solostar Pen Generic drug: insulin glargine  Inject 10 Units into the skin daily.   metFORMIN  1000 MG tablet Commonly known as: GLUCOPHAGE  Take 1 tablet (1,000 mg total) by mouth 2 (two) times daily with a meal.   multivitamin with minerals tablet Take 1 tablet by mouth daily.   PROBIOTIC PO Take 1 capsule by mouth 3 (three) times a week.   rosuvastatin  5 MG tablet Commonly known as: CRESTOR  Take 1 tablet  (5 mg total) by mouth at bedtime.   tamsulosin  0.4 MG Caps capsule Commonly known as: FLOMAX  Take 1 capsule (0.4 mg total) by mouth daily after supper.   traZODone  50 MG tablet Commonly known as: DESYREL  Take 1-2 tablets (50-100 mg total) by mouth at bedtime as needed for sleep (For insomnia as needed).         Objective:   BP 138/84   Pulse (!) 53   Ht 5' 11 (1.803 m)   Wt 253 lb (114.8 kg)   SpO2 96%   BMI 35.29 kg/m   Wt Readings from Last 3 Encounters:  06/19/24 253 lb (114.8 kg)  05/11/24 256 lb 2.8 oz (116.2 kg)  02/28/24 247 lb (112 kg)    Physical Exam Vitals and nursing note reviewed.  Constitutional:      General: He is not in acute distress.    Appearance: He is well-developed. He is not diaphoretic.  Eyes:     General: No scleral icterus.    Conjunctiva/sclera: Conjunctivae normal.  Neck:     Thyroid : No thyromegaly.  Cardiovascular:     Rate and Rhythm: Normal rate and regular rhythm.     Heart sounds: Normal heart sounds. No murmur heard. Pulmonary:     Effort: Pulmonary effort is normal. No respiratory distress.     Breath sounds: Normal breath sounds. No wheezing.  Musculoskeletal:        General: No swelling. Normal range of motion.     Cervical back: Neck supple.  Lymphadenopathy:     Cervical: No cervical adenopathy.  Skin:    General: Skin is warm and dry.     Findings: No rash.  Neurological:     Mental Status: He is alert and oriented to person, place, and time.     Coordination: Coordination normal.  Psychiatric:        Behavior: Behavior normal.       Assessment & Plan:   Problem List Items Addressed This Visit       Cardiovascular and Mediastinum   Hypertension associated with diabetes (HCC)     Endocrine   Type 2 diabetes mellitus with obesity (HCC) - Primary   Relevant Orders   Microalbumin/Creatinine Ratio, Urine   Bayer DCA Hb A1c Waived     Other   Pure hypercholesterolemia    Patient has come down off the  Ozempic  due to side effects, will see where his A1c is on the way out today.  He will also leave a urine on the way out today.  Blood pressure and everything else looks decent.  Continue to focus on diet. Follow up plan: Return in about 3 months (around 09/19/2024), or if symptoms worsen or fail to improve, for Diabetes recheck.  Counseling provided for all of the vaccine components Orders Placed This Encounter  Procedures  Microalbumin/Creatinine Ratio, Urine   Bayer DCA Hb A1c Waived    Fonda Levins, MD [facility] 06/19/2024, 8:25 AM

## 2024-06-20 LAB — MICROALBUMIN / CREATININE URINE RATIO
Creatinine, Urine: 62.5 mg/dL
Microalb/Creat Ratio: 74 mg/g{creat} — ABNORMAL HIGH (ref 0–29)
Microalbumin, Urine: 46 ug/mL

## 2024-06-29 ENCOUNTER — Other Ambulatory Visit

## 2024-07-05 ENCOUNTER — Inpatient Hospital Stay

## 2024-07-05 ENCOUNTER — Inpatient Hospital Stay: Attending: Hematology

## 2024-07-05 VITALS — BP 139/80 | HR 54 | Temp 97.5°F | Resp 18

## 2024-07-05 DIAGNOSIS — D751 Secondary polycythemia: Secondary | ICD-10-CM | POA: Diagnosis not present

## 2024-07-05 DIAGNOSIS — Z148 Genetic carrier of other disease: Secondary | ICD-10-CM

## 2024-07-05 LAB — CBC WITH DIFFERENTIAL/PLATELET
Abs Immature Granulocytes: 0.02 K/uL (ref 0.00–0.07)
Basophils Absolute: 0.1 K/uL (ref 0.0–0.1)
Basophils Relative: 1 %
Eosinophils Absolute: 0.2 K/uL (ref 0.0–0.5)
Eosinophils Relative: 2 %
HCT: 42 % (ref 39.0–52.0)
Hemoglobin: 13.2 g/dL (ref 13.0–17.0)
Immature Granulocytes: 0 %
Lymphocytes Relative: 21 %
Lymphs Abs: 1.5 K/uL (ref 0.7–4.0)
MCH: 24.5 pg — ABNORMAL LOW (ref 26.0–34.0)
MCHC: 31.4 g/dL (ref 30.0–36.0)
MCV: 77.9 fL — ABNORMAL LOW (ref 80.0–100.0)
Monocytes Absolute: 0.6 K/uL (ref 0.1–1.0)
Monocytes Relative: 9 %
Neutro Abs: 4.7 K/uL (ref 1.7–7.7)
Neutrophils Relative %: 67 %
Platelets: 259 K/uL (ref 150–400)
RBC: 5.39 MIL/uL (ref 4.22–5.81)
RDW: 15.9 % — ABNORMAL HIGH (ref 11.5–15.5)
WBC: 7 K/uL (ref 4.0–10.5)
nRBC: 0 % (ref 0.0–0.2)

## 2024-07-05 NOTE — Patient Instructions (Signed)
 [facility] [place] [facility] - A DEPT OF [facility]  Discharge Instructions: Thank you for choosing [facility] to provide your oncology and hematology care.  If you have a lab appointment with the Cancer Center - please note that after April 8th, 2024, all labs will be drawn in the cancer center.  You do not have to check in or register with the main entrance as you have in the past but will complete your check-in in the cancer center.  Wear comfortable clothing and clothing appropriate for easy access to any Portacath or PICC line.   We strive to give you quality time with your provider. You may need to reschedule your appointment if you arrive late (15 or more minutes).  Arriving late affects you and other patients whose appointments are after yours.  Also, if you miss three or more appointments without notifying the office, you may be dismissed from the clinic at the provider's discretion.      For prescription refill requests, have your pharmacy contact our office and allow 72 hours for refills to be completed.    Today you received therapeutic phlebotomy      BELOW ARE SYMPTOMS THAT SHOULD BE REPORTED IMMEDIATELY: *FEVER GREATER THAN 100.4 F (38 C) OR HIGHER *CHILLS OR SWEATING *NAUSEA AND VOMITING THAT IS NOT CONTROLLED WITH YOUR NAUSEA MEDICATION *UNUSUAL SHORTNESS OF BREATH *UNUSUAL BRUISING OR BLEEDING *URINARY PROBLEMS (pain or burning when urinating, or frequent urination) *BOWEL PROBLEMS (unusual diarrhea, constipation, pain near the anus) TENDERNESS IN MOUTH AND THROAT WITH OR WITHOUT PRESENCE OF ULCERS (sore throat, sores in mouth, or a toothache) UNUSUAL RASH, SWELLING OR PAIN  UNUSUAL VAGINAL DISCHARGE OR ITCHING   Items with * indicate a potential emergency and should be followed up as soon as possible or go to the Emergency Department if any problems should occur.  Please show the CHEMOTHERAPY ALERT CARD or IMMUNOTHERAPY ALERT CARD at  check-in to the Emergency Department and triage nurse.  Should you have questions after your visit or need to cancel or reschedule your appointment, please contact [facility] [place] - A DEPT OF [facility] [phone]  and follow the prompts.  Office hours are 8:00 a.m. to 4:30 p.m. Monday - Friday. Please note that voicemails left after 4:00 p.m. may not be returned until the following business day.  We are closed weekends and major holidays. You have access to a nurse at all times for urgent questions. Please call the main number to the clinic [phone] and follow the prompts.  For any non-urgent questions, you may also contact your provider using MyChart. We now offer e-Visits for anyone 70 and older to request care online for non-urgent symptoms. For details visit [email or web address].   Also download the MyChart app! Go to the app store, search "MyChart", open the app, select [place], and log in with your MyChart username and password.

## 2024-07-05 NOTE — Progress Notes (Signed)
 [name] presents today for theraputic phlebotomy per MD orders. Last hgb 13.2 /hct 42.0 was 07/05/24. VSS prior to procedure. Pt reports eating before arrival. Procedure started at 1510 using patients right AC. 500 mL of blood removed. Procedure ended at 1514. Gauze and coban applied to [name], site clean and dry. VSS upon completion of procedure. Pt denies dizziness, lightheadedness, or feeling faint.  Discharged in satisfactory condition with follow up instructions.

## 2024-07-05 NOTE — Progress Notes (Signed)
   07/05/24 1500  Spiritual Encounters  Type of Visit Initial  Care provided to: Patient  Conversation partners present during encounter Nurse  Referral source Other (comment) (Chaplain making rounds)  Reason for visit  (Introduction to Spiritual Care)  OnCall Visit No  Spiritual Framework  Presenting Themes Caregiving needs;Community and relationships  Community/Connection Family  Patient Stress Factors Not reviewed  Family Stress Factors Not reviewed  Interventions  Spiritual Care Interventions Made Established relationship of care and support;Narrative/life review  Intervention Outcomes  Outcomes Awareness of support  Spiritual Care Plan  Spiritual Care Issues Still Outstanding No further spiritual care needs at this time (see row info)   Reason for Visit: Chaplain making rounds on the floor visiting infusion Pts  Description of Visit: Arriving in the room I found [name] in a recliner chair awaiting treatment with no support person present.  I introduced myself as the new chaplain for the cancer center and offered a brief education on the role of a chaplain and the support we can offer to our patients, caregivers, and staff.  I began a conversation with them, and [name] was receptive to talking with me.  As a means to build relationship and rapport I asked guided questions designed to learn more about [name] and possibly bring about life storytelling/life review.  We spoke a bit about [name] pets (3 dogs, 4 cats, chickens, goats, and a few horses)  The RN arrived to conduct therapeutic phlebotomy, and he shared his health journey.  I was not able to complete a full spiritual assessment with [name], but from the information I did gather, [name] will likely not need further spiritual care support unless he reaches out.  Plan of Care: No further spiritual care interventions planned.   Maude Roll, MDiv  Chaplain, [facility] [email or web address]  [phone]

## 2024-07-06 ENCOUNTER — Telehealth: Admitting: Oncology

## 2024-07-11 ENCOUNTER — Telehealth: Payer: Self-pay | Admitting: Family Medicine

## 2024-07-11 NOTE — Telephone Encounter (Signed)
 I called and left detailed message for patient making him aware that his OZEMPIC  and TRESIBA is ready for pick up and advised for patient to pick up his order asap.

## 2024-07-19 ENCOUNTER — Telehealth: Payer: Self-pay

## 2024-07-19 NOTE — Telephone Encounter (Signed)
   Emailed refill request form to [employer].

## 2024-07-27 ENCOUNTER — Telehealth: Payer: Self-pay | Admitting: Pharmacist

## 2024-07-27 NOTE — Telephone Encounter (Signed)
   Unsuccessful outreach to patient re: Novo nordisk PAP medications.  Shipment has arrived, however Ozempic is no longer on patient's medication list.  VM left to clarify patient's medications.  Samar Dass Dattero Franki Alcaide, PharmD, BCACP, CPP Clinical Pharmacist, [facility]

## 2024-08-30 ENCOUNTER — Other Ambulatory Visit: Payer: Self-pay

## 2024-08-30 DIAGNOSIS — Z148 Genetic carrier of other disease: Secondary | ICD-10-CM

## 2024-08-30 DIAGNOSIS — D751 Secondary polycythemia: Secondary | ICD-10-CM

## 2024-08-31 ENCOUNTER — Inpatient Hospital Stay: Attending: Hematology

## 2024-08-31 ENCOUNTER — Inpatient Hospital Stay

## 2024-08-31 DIAGNOSIS — Z148 Genetic carrier of other disease: Secondary | ICD-10-CM

## 2024-08-31 DIAGNOSIS — D751 Secondary polycythemia: Secondary | ICD-10-CM | POA: Diagnosis not present

## 2024-08-31 LAB — CBC
HCT: 40.8 % (ref 39.0–52.0)
Hemoglobin: 12.7 g/dL — ABNORMAL LOW (ref 13.0–17.0)
MCH: 24.4 pg — ABNORMAL LOW (ref 26.0–34.0)
MCHC: 31.1 g/dL (ref 30.0–36.0)
MCV: 78.3 fL — ABNORMAL LOW (ref 80.0–100.0)
Platelets: 257 K/uL (ref 150–400)
RBC: 5.21 MIL/uL (ref 4.22–5.81)
RDW: 16 % — ABNORMAL HIGH (ref 11.5–15.5)
WBC: 6.2 K/uL (ref 4.0–10.5)
nRBC: 0 % (ref 0.0–0.2)

## 2024-08-31 NOTE — Progress Notes (Signed)
 Patient presents today for possible phlebotomy, Hgb 12.7 and HCT 40.8 NO phlebotomy today per Randall Hope NP. Patient made aware and discharged in satisfactory condition.

## 2024-09-01 ENCOUNTER — Telehealth: Payer: Self-pay | Admitting: Pharmacist

## 2024-09-03 ENCOUNTER — Other Ambulatory Visit: Payer: Self-pay | Admitting: Family Medicine

## 2024-09-03 DIAGNOSIS — E1169 Type 2 diabetes mellitus with other specified complication: Secondary | ICD-10-CM

## 2024-09-08 ENCOUNTER — Encounter: Payer: Self-pay | Admitting: Pharmacist

## 2024-09-08 NOTE — Telephone Encounter (Signed)
    Your [place] via Novo Nordisk patient assistance program is here at your doctor's office for pick up. Please pick up within 5 business days if you are able due to our limited storage space.    [name] [name], PharmD, BCACP, CPP Clinical Pharmacist, [facility]

## 2024-09-21 ENCOUNTER — Ambulatory Visit: Admitting: Family Medicine

## 2024-09-21 DIAGNOSIS — R972 Elevated prostate specific antigen [PSA]: Secondary | ICD-10-CM

## 2024-09-21 DIAGNOSIS — E7800 Pure hypercholesterolemia, unspecified: Secondary | ICD-10-CM

## 2024-09-21 DIAGNOSIS — I152 Hypertension secondary to endocrine disorders: Secondary | ICD-10-CM

## 2024-09-27 ENCOUNTER — Ambulatory Visit (INDEPENDENT_AMBULATORY_CARE_PROVIDER_SITE_OTHER)

## 2024-09-27 VITALS — BP 138/84 | HR 53 | Ht 71.0 in | Wt 253.0 lb

## 2024-09-27 DIAGNOSIS — Z0000 Encounter for general adult medical examination without abnormal findings: Secondary | ICD-10-CM | POA: Diagnosis not present

## 2024-09-27 NOTE — Patient Instructions (Signed)
 [name],  Thank you for taking the time for your Medicare Wellness Visit. I appreciate your continued commitment to your health goals. Please review the care plan we discussed, and feel free to reach out if I can assist you further.  Medicare recommends these wellness visits once per year to help you and your care team stay ahead of potential health issues. These visits are designed to focus on prevention, allowing your provider to concentrate on managing your acute and chronic conditions during your regular appointments.  Please note that Annual Wellness Visits do not include a physical exam. Some assessments may be limited, especially if the visit was conducted virtually. If needed, we may recommend a separate in-person follow-up with your provider.  Ongoing Care Seeing your primary care provider every 3 to 6 months helps us  monitor your health and provide consistent, personalized care.   Referrals If a referral was made during today's visit and you haven't received any updates within two weeks, please contact the referred provider directly to check on the status.  Recommended Screenings:  Health Maintenance  Topic Date Due   COVID-19 Vaccine (3 - Moderna risk series) 03/21/2020   Flu Shot  07/14/2024   Eye exam for diabetics  08/25/2024   Medicare Annual Wellness Visit  09/20/2024   DTaP/Tdap/Td vaccine (2 - Td or Tdap) 09/25/2024   Hemoglobin A1C  12/20/2024   Yearly kidney function blood test for diabetes  02/27/2025   Complete foot exam   02/27/2025   Yearly kidney health urinalysis for diabetes  06/19/2025   Colon Cancer Screening  02/07/2029   Pneumococcal Vaccine for age over 18  Completed   Hepatitis C Screening  Completed   Zoster (Shingles) Vaccine  Completed   Meningitis B Vaccine  Aged Out       09/27/2024    3:10 PM  Advanced Directives  Does Patient Have a Medical Advance Directive? No   Advance Care Planning is important because it: Ensures you receive  medical care that aligns with your values, goals, and preferences. Provides guidance to your family and loved ones, reducing the emotional burden of decision-making during critical moments.  Vision: Annual vision screenings are recommended for early detection of glaucoma, cataracts, and diabetic retinopathy. These exams can also reveal signs of chronic conditions such as diabetes and high blood pressure.  Dental: Annual dental screenings help detect early signs of oral cancer, gum disease, and other conditions linked to overall health, including heart disease and diabetes.  Please see the attached documents for additional preventive care recommendations.

## 2024-09-27 NOTE — Progress Notes (Signed)
 Subjective:   [name] is a 73 y.o. who presents for a Medicare Wellness preventive visit.  As a reminder, Annual Wellness Visits don't include a physical exam, and some assessments may be limited, especially if this visit is performed virtually. We may recommend an in-person follow-up visit with your provider if needed.  Visit Complete: Virtual I connected with  Helayne Marina on 09/27/24 by a audio enabled telemedicine application and verified that I am speaking with the correct person using two identifiers.  Patient Location: Home  Provider Location: Home Office  I discussed the limitations of evaluation and management by telemedicine. The patient expressed understanding and agreed to proceed.  Vital Signs: Because this visit was a virtual/telehealth visit, some criteria may be missing or patient reported. Any vitals not documented were not able to be obtained and vitals that have been documented are patient reported.  VideoDeclined- This patient declined Librarian, academic. Therefore the visit was completed with audio only.  Persons Participating in Visit: Patient.  AWV Questionnaire: Yes: Patient Medicare AWV questionnaire was completed,09/24/24,  prior to this visit.        Objective:    Today's Vitals   09/27/24 1507  BP: 138/84  Pulse: (!) 53  Weight: 253 lb (114.8 kg)  Height: 5' 11 (1.803 m)   Body mass index is 35.29 kg/m.     09/27/2024    3:10 PM 07/05/2024    3:57 PM 05/11/2024    2:35 PM 11/17/2023    1:37 PM 09/21/2023    1:10 PM 06/03/2023    3:04 PM 06/03/2023    1:51 PM  Advanced Directives  Does Patient Have a Medical Advance Directive? No No Yes Yes Yes Yes Yes  Type of Surveyor, minerals;Living will Healthcare Power of [place];Living will Healthcare Power of [place];Living will Healthcare Power of [place];Living will Healthcare Power of [place];Living will  Does patient want to  make changes to medical advance directive?  No - Patient declined  No - Patient declined   No - Patient declined  Copy of Healthcare Power of Attorney in Chart?   No - copy requested No - copy requested No - copy requested  No - copy requested  Would patient like information on creating a medical advance directive?  No - Patient declined No - Patient declined No - Patient declined   No - Patient declined    Current Medications (verified) Outpatient Encounter Medications as of 09/27/2024  Medication Sig   acetaminophen  (TYLENOL ) 325 MG tablet Take 2 tablets (650 mg total) by mouth every 6 (six) hours as needed for moderate pain or headache.   aspirin EC 81 MG tablet Take 81 mg by mouth every evening.   Continuous Glucose Receiver (FREESTYLE LIBRE 3 READER) DEVI 1 each by Does not apply route 4 (four) times daily.   Continuous Glucose Sensor (FREESTYLE LIBRE 3 PLUS SENSOR) MISC Change sensor every 15 days.   ferrous sulfate 325 (65 FE) MG tablet Take 325 mg by mouth daily with breakfast.   gabapentin  (NEURONTIN ) 100 MG capsule Take 2 capsules (200 mg total) by mouth at bedtime.   glucose blood (ONETOUCH VERIO) test strip CHECK BLOOD GLUCOSE ONCE DAILY Dx E11.69   hydrOXYzine  (ATARAX ) 25 MG tablet Take 1 tablet (25 mg total) by mouth as needed. Cuts in half as needed   insulin glargine  (LANTUS  SOLOSTAR) 100 UNIT/ML Solostar Pen Inject 10 Units into the skin daily.   irbesartan  (AVAPRO )  150 MG tablet Take 1 tablet (150 mg total) by mouth daily.   metFORMIN  (GLUCOPHAGE ) 1000 MG tablet Take 1 tablet (1,000 mg total) by mouth 2 (two) times daily with a meal.   Multiple Vitamins-Minerals (MULTIVITAMIN WITH MINERALS) tablet Take 1 tablet by mouth daily.   Probiotic Product (PROBIOTIC PO) Take 1 capsule by mouth 3 (three) times a week.    rosuvastatin  (CRESTOR ) 5 MG tablet Take 1 tablet (5 mg total) by mouth at bedtime.   tamsulosin  (FLOMAX ) 0.4 MG CAPS capsule Take 1 capsule (0.4 mg total) by mouth  daily after supper.   traZODone  (DESYREL ) 50 MG tablet Take 1-2 tablets (50-100 mg total) by mouth at bedtime as needed for sleep (For insomnia as needed).   No facility-administered encounter medications on file as of 09/27/2024.    Allergies (verified) Sulfa  antibiotics and Ramipril    History: Past Medical History:  Diagnosis Date   Diabetes mellitus without complication (HCC)    GERD (gastroesophageal reflux disease)    Hx of [place] spotted fever [id number]   Hypertension    Sleep apnea    wears cpap    Past Surgical History:  Procedure Laterality Date   COLONOSCOPY  09/2012   Dr. Donnel. Diverticulosis, descending adenomatous colon polyp, hyperplastic rectal polyp. ?mass in anal canal evaluated by surgery and consistent with internal hemorrhoid   COLONOSCOPY WITH PROPOFOL  N/A 02/07/2019   Procedure: COLONOSCOPY WITH PROPOFOL ;  Surgeon: Harvey Margo CROME, MD;  [facility];  Service: Endoscopy;  Laterality: N/A;  9:30am   KNEE SURGERY Left 10/2014   KNEE SURGERY Right 09/2015   POLYPECTOMY  02/07/2019   Procedure: POLYPECTOMY;  Surgeon: Harvey Margo CROME, MD;  Location: [facility];  Service: Endoscopy;;   Family History  Problem Relation Age of Onset   Heart disease Mother    Congestive Heart Failure Mother    Diabetes Father    Aneurysm Sister    Diabetes Paternal Uncle    Diabetes Paternal Grandmother    Colon cancer Neg Hx    Colon polyps Neg Hx    Social History   Socioeconomic History   Marital status: Single    Spouse name: Not on file   Number of children: 0   Years of education: 12   Highest education level: 12th grade  Occupational History   Occupation: Retired    Comment: [employer]  Tobacco Use   Smoking status: Never   Smokeless tobacco: Never  Vaping Use   Vaping status: Never Used  Substance and Sexual Activity   Alcohol use: No   Drug use: No   Sexual activity: Not Currently  Other Topics Concern   Not on file   Social History Narrative   WORKED AT [employer] FOR > 10 YRS. NOT MARRIED. FUR BABIES: 3 DOGS AND 5 CATS. SPENDS FREE TIME: GARDENS, BEE KEEPER, WALKS.   Social Drivers of Corporate investment banker Strain: Low Risk  (09/24/2024)   Overall Financial Resource Strain (CARDIA)    Difficulty of Paying Living Expenses: Not hard at all  Food Insecurity: No Food Insecurity (09/24/2024)   Hunger Vital Sign    Worried About Running Out of Food in the Last Year: Never true    Ran Out of Food in the Last Year: Never true  Transportation Needs: No Transportation Needs (09/24/2024)   PRAPARE - Administrator, Civil Service (Medical): No    Lack of Transportation (Non-Medical): No  Physical  Activity: Insufficiently Active (09/24/2024)   Exercise Vital Sign    Days of Exercise per Week: 3 days    Minutes of Exercise per Session: 30 min  Stress: No Stress Concern Present (09/24/2024)   [employer] of Occupational Health - Occupational Stress Questionnaire    Feeling of Stress: Only a little  Social Connections: Moderately Integrated (09/24/2024)   Social Connection and Isolation Panel    Frequency of Communication with Friends and Family: More than three times a week    Frequency of Social Gatherings with Friends and Family: Twice a week    Attends Religious Services: More than 4 times per year    Active Member of [employer] or Organizations: Yes    Attends Engineer, structural: More than 4 times per year    Marital Status: Never married    Tobacco Counseling Counseling given: Yes    Clinical Intake:  Pre-visit preparation completed: Yes  Pain : No/denies pain     BMI - recorded: 35.29 Nutritional Status: BMI > 30  Obese Nutritional Risks: None Diabetes: Yes  Lab Results  Component Value Date   HGBA1C 9.8 (H) 06/19/2024   HGBA1C 10.9 (H) 02/28/2024   HGBA1C 13.7 (H) 11/25/2023     How often do you need to have someone help you when you read  instructions, pamphlets, or other written materials from your doctor or pharmacy?: 1 - Never  Interpreter Needed?: No  Information entered by :: alia t/cma   Activities of Daily Living     09/24/2024    5:17 PM 09/18/2024    6:25 PM  In your present state of health, do you have any difficulty performing the following activities:  Hearing? 0 0  Vision? 0 0  Difficulty concentrating or making decisions? 0 0  Walking or climbing stairs? 0 0  Dressing or bathing? 0 0  Doing errands, shopping? 0 0  Preparing Food and eating ? N N  Using the Toilet? N N  In the past six months, have you accidently leaked urine? N N  Do you have problems with loss of bowel control? N N  Managing your Medications? N N  Managing your Finances? N N  Housekeeping or managing your Housekeeping? N N    Patient Care Team: Dettinger, Fonda LABOR, MD as PCP - General (Family Medicine) Delane Cloyd CROME, NP-C (Inactive) as Nurse Practitioner (Hematology and Oncology) Matilda Senior, MD as Consulting Physician (Urology) Billee Mliss BIRCH, [facility] as Pharmacist (Family Medicine)  I have updated your Care Teams any recent Medical Services you may have received from other providers in the past year.     Assessment:   This is a routine wellness examination for [place].  Hearing/Vision screen Hearing Screening - Comments:: Pt denies hearing dif Vision Screening - Comments:: Pt wear readers/pt [facility] in [place]/last ov upcoming appt in 12/25   Goals Addressed             This Visit's Progress    Patient Stated   On track    09/16/2022 AWV Goal: Fall Prevention  Over the next year, patient will decrease their risk for falls by: Using assistive devices, such as a cane or walker, as needed Identifying fall risks within their home and correcting them by: Removing throw rugs Adding handrails to stairs or ramps Removing clutter and keeping a clear pathway throughout the home Increasing light, especially at  night Adding shower handles/bars Raising toilet seat Identifying potential personal risk factors for falls: Medication side  effects Incontinence/urgency Vestibular dysfunction Hearing loss Musculoskeletal disorders Neurological disorders Orthostatic hypotension         Depression Screen     09/27/2024    3:11 PM 06/19/2024    8:09 AM 02/28/2024    7:57 AM 11/25/2023    8:18 AM 09/21/2023    1:08 PM 08/26/2023   10:57 AM 05/20/2023    8:35 AM  PHQ 2/9 Scores  PHQ - 2 Score 0 0 0 1 0 0 0  PHQ- 9 Score   3 2 0      Fall Risk     09/24/2024    5:17 PM 09/18/2024    6:25 PM 06/19/2024    8:09 AM 02/28/2024    7:57 AM 11/25/2023    8:18 AM  Fall Risk   Falls in the past year? 0 0 0 0 0  Number falls in past yr:   0    Injury with Fall?   0    Risk for fall due to :   No Fall Risks    Follow up   Falls evaluation completed      MEDICARE RISK AT HOME:  Medicare Risk at Home Any stairs in or around the home?: (Patient-Rptd) Yes If so, are there any without handrails?: (Patient-Rptd) No Home free of loose throw rugs in walkways, pet beds, electrical cords, etc?: (Patient-Rptd) No Adequate lighting in your home to reduce risk of falls?: (Patient-Rptd) Yes Life alert?: (Patient-Rptd) No Use of a cane, walker or w/c?: (Patient-Rptd) No Grab bars in the bathroom?: (Patient-Rptd) Yes Shower chair or bench in shower?: (Patient-Rptd) Yes Elevated toilet seat or a handicapped toilet?: (Patient-Rptd) No  TIMED UP AND GO:  Was the test performed?  no  Cognitive Function: 6CIT completed    05/24/2018    8:23 AM  MMSE - Mini Mental State Exam  Orientation to time 5  Orientation to Place 5  Registration 3  Attention/ Calculation 5  Recall 3  Language- name 2 objects 2  Language- repeat 1  Language- follow 3 step command 3  Language- read & follow direction 1  Write a sentence 1  Copy design 1  Total score 30        09/27/2024    3:12 PM 09/21/2023    1:10 PM 09/16/2022    11:20 AM 09/13/2020   10:47 AM 09/13/2019   10:47 AM  6CIT Screen  What Year? 0 points 0 points 0 points 0 points 0 points  What month? 0 points 0 points 0 points 0 points 0 points  What time? 0 points 0 points 0 points  0 points  Count back from 20 0 points 0 points 0 points  0 points  Months in reverse 0 points 0 points 0 points  0 points  Repeat phrase 0 points 0 points 2 points  2 points  Total Score 0 points 0 points 2 points  2 points    Immunizations Immunization History  Administered Date(s) Administered   Fluad Quad(high Dose 65+) 10/10/2019, 08/20/2020, 10/14/2021, 11/03/2022   Fluad Trivalent(High Dose 65+) 08/26/2023   Influenza,inj,Quad PF,6+ Mos 09/25/2014, 10/22/2016, 09/22/2017, 09/23/2018   Influenza-Unspecified 08/20/2020   Moderna Sars-Covid-2 Vaccination 01/25/2020, 02/22/2020   Pneumococcal Conjugate-13 09/27/2020   Pneumococcal Polysaccharide-23 10/14/2021   Tdap 09/25/2014   Zoster Recombinant(Shingrix ) 04/20/2018, 05/20/2023    Screening Tests Health Maintenance  Topic Date Due   COVID-19 Vaccine (3 - Moderna risk series) 03/21/2020   Influenza Vaccine  07/14/2024   OPHTHALMOLOGY  EXAM  08/25/2024   DTaP/Tdap/Td (2 - Td or Tdap) 09/25/2024   HEMOGLOBIN A1C  12/20/2024   Diabetic kidney evaluation - eGFR measurement  02/27/2025   FOOT EXAM  02/27/2025   Diabetic kidney evaluation - Urine ACR  06/19/2025   Medicare Annual Wellness (AWV)  09/27/2025   Colonoscopy  02/07/2029   Pneumococcal Vaccine: 50+ Years  Completed   Hepatitis C Screening  Completed   Zoster Vaccines- Shingrix   Completed   Meningococcal B Vaccine  Aged Out    Health Maintenance Items Addressed: See Nurse Notes at the end of this note  Additional Screening:  Vision Screening: Recommended annual ophthalmology exams for early detection of glaucoma and other disorders of the eye. Is the patient up to date with their annual eye exam?  Yes  Who is the provider or what is the  name of the office in which the patient attends annual eye exams? [facility]  Dental Screening: Recommended annual dental exams for proper oral hygiene  Community Resource Referral / Chronic Care Management: CRR required this visit?  No   CCM required this visit?  No   Plan:    I have personally reviewed and noted the following in the patient's chart:   Medical and social history Use of alcohol, tobacco or illicit drugs  Current medications and supplements including opioid prescriptions. Patient is not currently taking opioid prescriptions. Functional ability and status Nutritional status Physical activity Advanced directives List of other physicians Hospitalizations, surgeries, and ER visits in previous 12 months Vitals Screenings to include cognitive, depression, and falls Referrals and appointments  In addition, I have reviewed and discussed with patient certain preventive protocols, quality metrics, and best practice recommendations. A written personalized care plan for preventive services as well as general preventive health recommendations were provided to patient.   Ozie Ned, CMA   09/27/2024   After Visit Summary: (MyChart) Due to this being a telephonic visit, the after visit summary with patients personalized plan was offered to patient via MyChart   Notes: PCP Follow Up Recommendations: Pt is aware and due the following: covd, flu, DTAP vaccines, Diabetic eye exam--upcoming apt 12/25

## 2024-09-30 ENCOUNTER — Encounter: Payer: Self-pay | Admitting: Family Medicine

## 2024-10-04 NOTE — Progress Notes (Signed)
 [name]                                          MRN: [id number]   10/04/2024   The VBCI Quality Team Specialist reviewed this patient medical record for the purposes of chart review for care gap closure. The following were reviewed: chart review for care gap closure-glycemic status assessment.    VBCI Quality Team

## 2024-10-05 ENCOUNTER — Encounter: Payer: Self-pay | Admitting: Family Medicine

## 2024-10-05 ENCOUNTER — Ambulatory Visit: Admitting: Family Medicine

## 2024-10-05 VITALS — BP 126/74 | HR 59 | Ht 71.0 in | Wt 252.0 lb

## 2024-10-05 DIAGNOSIS — E1169 Type 2 diabetes mellitus with other specified complication: Secondary | ICD-10-CM

## 2024-10-05 DIAGNOSIS — E7800 Pure hypercholesterolemia, unspecified: Secondary | ICD-10-CM

## 2024-10-05 DIAGNOSIS — E1159 Type 2 diabetes mellitus with other circulatory complications: Secondary | ICD-10-CM

## 2024-10-05 DIAGNOSIS — Z6835 Body mass index (BMI) 35.0-35.9, adult: Secondary | ICD-10-CM

## 2024-10-05 DIAGNOSIS — Z794 Long term (current) use of insulin: Secondary | ICD-10-CM | POA: Diagnosis not present

## 2024-10-05 DIAGNOSIS — R972 Elevated prostate specific antigen [PSA]: Secondary | ICD-10-CM

## 2024-10-05 DIAGNOSIS — I152 Hypertension secondary to endocrine disorders: Secondary | ICD-10-CM

## 2024-10-05 DIAGNOSIS — Z23 Encounter for immunization: Secondary | ICD-10-CM

## 2024-10-05 DIAGNOSIS — E669 Obesity, unspecified: Secondary | ICD-10-CM

## 2024-10-05 LAB — BAYER DCA HB A1C WAIVED: HB A1C (BAYER DCA - WAIVED): 11.2 % — ABNORMAL HIGH (ref 4.8–5.6)

## 2024-10-05 MED ORDER — LANTUS SOLOSTAR 100 UNIT/ML ~~LOC~~ SOPN: 16.0000 [IU] | PEN_INJECTOR | Freq: Every day | SUBCUTANEOUS | 3 refills | Status: DC

## 2024-10-05 NOTE — Progress Notes (Signed)
 BP 126/74   Pulse (!) 59   Ht 5' 11 (1.803 m)   Wt 252 lb (114.3 kg)   SpO2 95%   BMI 35.15 kg/m    Subjective:   Patient ID: [name], male    DOB: 20-Sep-1951, 73 y.o.   MRN: [id number]  HPI: [name] is a 73 y.o. male presenting on 10/05/2024 for Medical Management of Chronic Issues and Diabetes   Discussed the use of AI scribe software for clinical note transcription with the patient, who gave verbal consent to proceed.  History of Present Illness   [name] is a 73 year old male with diabetes who presents with elevated blood sugar levels.  Hyperglycemia - Elevated blood glucose levels, with a recent reading of 11 mmol/L - Uncertain etiology for increased glucose - Difficulty maintaining glucose sensor placement due to nocturnal movement; sensor dislodged twice - Current diabetes regimen includes metformin  twice daily and Lantus  10 units daily  Dietary habits - Diet primarily consists of sandwiches, grilled chicken, and unsweetened tea - Morning coffee with milk; consumes approximately two gallons of milk per week - Snacks include apples, bananas, grapes, watermelon, and occasional Lance crackers with peanut butter - No consumption of sodas or candy - Reduced intake of French fries  Physical activity - 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 Interim medical history since our last visit reviewed. Allergies and medications reviewed and updated.  Review of Systems   Constitutional:  Negative for chills and fever.  Eyes:  Negative for visual disturbance.  Respiratory:  Negative for shortness of breath and wheezing.   Cardiovascular:  Negative for chest pain and leg swelling.  Musculoskeletal:  Negative for back pain and gait problem.  Skin:  Negative for rash.  Neurological:  Negative for dizziness and light-headedness.  All other systems reviewed and are negative.   Per HPI unless specifically indicated above   Allergies as of 10/05/2024       Reactions   Sulfa  Antibiotics    rash   Ramipril  Cough        Medication List        Accurate as of October 05, 2024  1:14 PM. If you have any questions, ask your nurse or doctor.          acetaminophen  325 MG tablet Commonly known as: Tylenol  Take 2 tablets (650 mg total) by mouth every 6 (six) hours as needed for moderate pain or headache.   aspirin EC 81 MG tablet Take 81 mg by mouth every evening.   ferrous sulfate 325 (65 FE) MG tablet Take 325 mg by mouth daily with breakfast.   FreeStyle Libre 3 Plus Sensor Misc Change sensor every 15 days.   FreeStyle Libre 3 Reader Devi 1 each by Does not apply route 4 (four) times daily.   gabapentin  100 MG capsule Commonly known as: NEURONTIN  Take 2 capsules (200 mg total) by mouth at bedtime.   hydrOXYzine  25 MG tablet Commonly known as: ATARAX  Take 1 tablet (25 mg total) by mouth  as needed. Cuts in half as needed   irbesartan  150 MG tablet Commonly known as: AVAPRO  Take 1 tablet (150 mg total) by mouth daily.   Lantus  SoloStar 100 UNIT/ML Solostar Pen Generic drug: insulin glargine  Inject 16 Units into the skin daily. What changed: how much to take Changed by: Fonda LABOR Cristy Colmenares   metFORMIN  1000 MG tablet Commonly known as: GLUCOPHAGE  Take 1 tablet (1,000 mg total) by mouth 2 (two) times daily with a meal.   multivitamin with minerals tablet Take 1 tablet by mouth daily.   OneTouch Verio test strip Generic drug:  glucose blood CHECK BLOOD GLUCOSE ONCE DAILY Dx E11.69   PROBIOTIC PO Take 1 capsule by mouth 3 (three) times a week.   rosuvastatin  5 MG tablet Commonly known as: CRESTOR  Take 1 tablet (5 mg total) by mouth at bedtime.   tamsulosin  0.4 MG Caps capsule Commonly known as: FLOMAX  Take 1 capsule (0.4 mg total) by mouth daily after supper.   traZODone  50 MG tablet Commonly known as: DESYREL  Take 1-2 tablets (50-100 mg total) by mouth at bedtime as needed for sleep (For insomnia as needed).         Objective:   BP 126/74   Pulse (!) 59   Ht 5' 11 (1.803 m)   Wt 252 lb (114.3 kg)   SpO2 95%   BMI 35.15 kg/m   Wt Readings from Last 3 Encounters:  10/05/24 252 lb (114.3 kg)  09/27/24 253 lb (114.8 kg)  06/19/24 253 lb (114.8 kg)    Physical Exam Physical Exam   CHEST: Lungs clear to auscultation bilaterally. CARDIOVASCULAR: Heart sounds normal, regular rhythm.         Assessment & Plan:   Problem List Items Addressed This Visit       Cardiovascular and Mediastinum   Hypertension associated with diabetes (HCC) - Primary   Relevant Medications   insulin glargine  (LANTUS  SOLOSTAR) 100 UNIT/ML Solostar Pen   Other Relevant Orders   CBC with Differential/Platelet   CMP14+EGFR   Lipid panel   Bayer DCA Hb A1c Waived   Vitamin B12     Endocrine   Type 2 diabetes mellitus in patient with obesity (HCC)   Relevant Medications   insulin glargine  (LANTUS  SOLOSTAR) 100 UNIT/ML Solostar Pen   Type 2 diabetes mellitus with hypercholesterolemia (HCC)   Relevant Medications   insulin glargine  (LANTUS  SOLOSTAR) 100 UNIT/ML Solostar Pen     Other   Elevated PSA   Relevant Orders   PSA, total and free   Obesity, morbid (HCC)   Relevant Medications   insulin glargine  (LANTUS  SOLOSTAR) 100 UNIT/ML Solostar Pen        Type 2 diabetes mellitus Type 2 diabetes with elevated glucose levels. Difficulty maintaining sensor placement. Current treatment includes metformin   and Lantus . Increased sedentary behavior may contribute to poor control. - Increase Lantus  to 16 units daily. - Advise use of Tegaderm to secure glucose sensor. - Consider switching to Dexcom sensor if insurance allows. - Follow up in 3 months unless symptoms worsen. - Diabetes associated with hypertension and hyperlipidemia and morbid obesity  Morbid obesity Morbid obesity potentially impacting diabetes management. Diet includes whole wheat bread, fruits, vegetables, and occasional crackers. Sedentary behavior due to driving may affect weight management.          Follow up plan: Return in about 3 months (around 01/05/2025), or if symptoms worsen or fail to improve, for Diabetes recheck.  Counseling provided for all of the vaccine  components Orders Placed This Encounter  Procedures   CBC with Differential/Platelet   CMP14+EGFR   Lipid panel   PSA, total and free   Bayer DCA Hb A1c Waived   Vitamin B12    Shineka Auble, MD [facility] 10/05/2024, 1:14 PM

## 2024-10-06 LAB — CBC WITH DIFFERENTIAL/PLATELET
Basophils Absolute: 0.1 x10E3/uL (ref 0.0–0.2)
Basos: 1 %
EOS (ABSOLUTE): 0.1 x10E3/uL (ref 0.0–0.4)
Eos: 2 %
Hematocrit: 45 % (ref 37.5–51.0)
Hemoglobin: 13.6 g/dL (ref 13.0–17.7)
Immature Grans (Abs): 0 x10E3/uL (ref 0.0–0.1)
Immature Granulocytes: 0 %
Lymphocytes Absolute: 1.4 x10E3/uL (ref 0.7–3.1)
Lymphs: 24 %
MCH: 24.4 pg — ABNORMAL LOW (ref 26.6–33.0)
MCHC: 30.2 g/dL — ABNORMAL LOW (ref 31.5–35.7)
MCV: 81 fL (ref 79–97)
Monocytes Absolute: 0.5 x10E3/uL (ref 0.1–0.9)
Monocytes: 7 %
Neutrophils Absolute: 4 x10E3/uL (ref 1.4–7.0)
Neutrophils: 66 %
Platelets: 274 x10E3/uL (ref 150–450)
RBC: 5.57 x10E6/uL (ref 4.14–5.80)
RDW: 16.2 % — ABNORMAL HIGH (ref 11.6–15.4)
WBC: 6.1 x10E3/uL (ref 3.4–10.8)

## 2024-10-06 LAB — PSA, TOTAL AND FREE
PSA, Free Pct: 48.9 %
PSA, Free: 2.15 ng/mL
Prostate Specific Ag, Serum: 4.4 ng/mL — ABNORMAL HIGH (ref 0.0–4.0)

## 2024-10-06 LAB — CMP14+EGFR
ALT: 16 IU/L (ref 0–44)
AST: 19 IU/L (ref 0–40)
Albumin: 4 g/dL (ref 3.8–4.8)
Alkaline Phosphatase: 82 IU/L (ref 47–123)
BUN/Creatinine Ratio: 14 (ref 10–24)
BUN: 17 mg/dL (ref 8–27)
Bilirubin Total: 0.6 mg/dL (ref 0.0–1.2)
CO2: 20 mmol/L (ref 20–29)
Calcium: 9.6 mg/dL (ref 8.6–10.2)
Chloride: 100 mmol/L (ref 96–106)
Creatinine, Ser: 1.2 mg/dL (ref 0.76–1.27)
Globulin, Total: 2.9 g/dL (ref 1.5–4.5)
Glucose: 260 mg/dL — ABNORMAL HIGH (ref 70–99)
Potassium: 5 mmol/L (ref 3.5–5.2)
Sodium: 137 mmol/L (ref 134–144)
Total Protein: 6.9 g/dL (ref 6.0–8.5)
eGFR: 64 mL/min/1.73 (ref >59)

## 2024-10-06 LAB — LIPID PANEL
Chol/HDL Ratio: 2.8 ratio (ref 0.0–5.0)
Cholesterol, Total: 96 mg/dL — ABNORMAL LOW (ref 100–199)
HDL: 34 mg/dL — ABNORMAL LOW (ref >39)
LDL Chol Calc (NIH): 38 mg/dL (ref 0–99)
Triglycerides: 140 mg/dL (ref 0–149)
VLDL Cholesterol Cal: 24 mg/dL (ref 5–40)

## 2024-10-09 ENCOUNTER — Telehealth: Payer: Self-pay | Admitting: Pharmacist

## 2024-10-09 NOTE — Telephone Encounter (Signed)
 Met with patient in clinic Offered Mounjaro (declined) Cannot tolerate Ozempic  due to GI States his CGM won't stay on--tegaderm given Declined any further assistance  Mliss Tarry Griffin, PharmD, BCACP, CPP Clinical Pharmacist, [facility]

## 2024-10-11 ENCOUNTER — Ambulatory Visit: Payer: Self-pay | Admitting: Family Medicine

## 2024-11-03 ENCOUNTER — Inpatient Hospital Stay: Admitting: Oncology

## 2024-11-03 ENCOUNTER — Inpatient Hospital Stay

## 2024-12-11 ENCOUNTER — Encounter: Payer: Self-pay | Admitting: *Deleted

## 2024-12-14 ENCOUNTER — Encounter (HOSPITAL_COMMUNITY): Payer: Self-pay | Admitting: Oncology

## 2024-12-21 ENCOUNTER — Inpatient Hospital Stay

## 2024-12-21 ENCOUNTER — Inpatient Hospital Stay: Admitting: Oncology

## 2024-12-21 ENCOUNTER — Inpatient Hospital Stay: Attending: Hematology

## 2024-12-21 VITALS — BP 135/82 | HR 55 | Temp 98.4°F | Resp 18 | Ht 71.0 in | Wt 253.0 lb

## 2024-12-21 DIAGNOSIS — G4730 Sleep apnea, unspecified: Secondary | ICD-10-CM

## 2024-12-21 DIAGNOSIS — Z148 Genetic carrier of other disease: Secondary | ICD-10-CM | POA: Insufficient documentation

## 2024-12-21 DIAGNOSIS — D751 Secondary polycythemia: Secondary | ICD-10-CM | POA: Diagnosis not present

## 2024-12-21 LAB — CBC WITH DIFFERENTIAL/PLATELET
Abs Immature Granulocytes: 0.02 K/uL (ref 0.00–0.07)
Basophils Absolute: 0.1 K/uL (ref 0.0–0.1)
Basophils Relative: 1 %
Eosinophils Absolute: 0.2 K/uL (ref 0.0–0.5)
Eosinophils Relative: 2 %
HCT: 50 % (ref 39.0–52.0)
Hemoglobin: 16.5 g/dL (ref 13.0–17.0)
Immature Granulocytes: 0 %
Lymphocytes Relative: 19 %
Lymphs Abs: 1.5 K/uL (ref 0.7–4.0)
MCH: 28.4 pg (ref 26.0–34.0)
MCHC: 33 g/dL (ref 30.0–36.0)
MCV: 86.2 fL (ref 80.0–100.0)
Monocytes Absolute: 0.6 K/uL (ref 0.1–1.0)
Monocytes Relative: 8 %
Neutro Abs: 5.5 K/uL (ref 1.7–7.7)
Neutrophils Relative %: 70 %
Platelets: 221 K/uL (ref 150–400)
RBC: 5.8 MIL/uL (ref 4.22–5.81)
RDW: 17.5 % — ABNORMAL HIGH (ref 11.5–15.5)
WBC: 7.8 K/uL (ref 4.0–10.5)
nRBC: 0 % (ref 0.0–0.2)

## 2024-12-21 LAB — IRON AND TIBC
Iron: 54 ug/dL (ref 45–182)
Saturation Ratios: 14 % — ABNORMAL LOW (ref 17.9–39.5)
TIBC: 381 ug/dL (ref 250–450)
UIBC: 327 ug/dL

## 2024-12-21 LAB — FERRITIN: Ferritin: 23 ng/mL — ABNORMAL LOW (ref 24–336)

## 2024-12-21 NOTE — Assessment & Plan Note (Addendum)
 Continue CPAP.

## 2024-12-21 NOTE — Assessment & Plan Note (Addendum)
-   Differential diagnosis favors secondary polycythemia in the setting of OSA, but it is also possible that he may have triple negative primary polycythemia - Labs today hemoglobin 16.5, hematocrit 50.0, ferritin 23 and iron saturation 14%. -Patient needs phlebotomy today. -Will increase phlebotomies to every 6 weeks for hematocrit greater than 40 and return to clinic to see a provider in 24 weeks. - Most recent phlebotomy is from 07/05/2024. -Patient reports he missed a phlebotomy back in December due to getting appointments mixed up. - Would consider bone marrow biopsy if he had any major changes in his blood counts.  Patient declines bone marrow biopsy at this time.

## 2024-12-21 NOTE — Patient Instructions (Signed)
 [facility] - A DEPT OF [facility]  Discharge Instructions: Thank you for choosing [facility] to provide your oncology and hematology care.  If you have a lab appointment with the Cancer Center - please note that after April 8th, 2024, all labs will be drawn in the cancer center.  You do not have to check in or register with the main entrance as you have in the past but will complete your check-in in the cancer center.  Wear comfortable clothing and clothing appropriate for easy access to any Portacath or PICC line.   We strive to give you quality time with your provider. You may need to reschedule your appointment if you arrive late (15 or more minutes).  Arriving late affects you and other patients whose appointments are after yours.  Also, if you miss three or more appointments without notifying the office, you may be dismissed from the clinic at the providers discretion.      For prescription refill requests, have your pharmacy contact our office and allow 72 hours for refills to be completed.    Today you received the following chemotherapy and/or immunotherapy agents therapeutic phlebotomy        BELOW ARE SYMPTOMS THAT SHOULD BE REPORTED IMMEDIATELY: *FEVER GREATER THAN 100.4 F (38 C) OR HIGHER *CHILLS OR SWEATING *NAUSEA AND VOMITING THAT IS NOT CONTROLLED WITH YOUR NAUSEA MEDICATION *UNUSUAL SHORTNESS OF BREATH *UNUSUAL BRUISING OR BLEEDING *URINARY PROBLEMS (pain or burning when urinating, or frequent urination) *BOWEL PROBLEMS (unusual diarrhea, constipation, pain near the anus) TENDERNESS IN MOUTH AND THROAT WITH OR WITHOUT PRESENCE OF ULCERS (sore throat, sores in mouth, or a toothache) UNUSUAL RASH, SWELLING OR PAIN  UNUSUAL VAGINAL DISCHARGE OR ITCHING   Items with * indicate a potential emergency and should be followed up as soon as possible or go to the Emergency Department if any problems should occur.  Please show the  CHEMOTHERAPY ALERT CARD or IMMUNOTHERAPY ALERT CARD at check-in to the Emergency Department and triage nurse.  Should you have questions after your visit or need to cancel or reschedule your appointment, please contact [facility] - A DEPT OF [facility] [phone]  and follow the prompts.  Office hours are 8:00 a.m. to 4:30 p.m. Monday - Friday. Please note that voicemails left after 4:00 p.m. may not be returned until the following business day.  We are closed weekends and major holidays. You have access to a nurse at all times for urgent questions. Please call the main number to the clinic [phone] and follow the prompts.  For any non-urgent questions, you may also contact your provider using MyChart. We now offer e-Visits for anyone 55 and older to request care online for non-urgent symptoms. For details visit [email or web address].   Also download the MyChart app! Go to the app store, search MyChart, open the app, select [facility], and log in with your MyChart username and password.

## 2024-12-21 NOTE — Progress Notes (Signed)
 "  [facility] OFFICE PROGRESS NOTE  Dettinger, Fonda LABOR, MD  ASSESSMENT & PLAN:    Assessment & Plan Polycythemia, secondary - Differential diagnosis favors secondary polycythemia in the setting of OSA, but it is also possible that he may have triple negative primary polycythemia - Labs today hemoglobin 16.5, hematocrit 50.0, ferritin 23 and iron saturation 14%. -Patient needs phlebotomy today. -Will increase phlebotomies to every 6 weeks for hematocrit greater than 40 and return to clinic to see a provider in 24 weeks. - Most recent phlebotomy is from 07/05/2024. -Patient reports he missed a phlebotomy back in December due to getting appointments mixed up. - Would consider bone marrow biopsy if he had any major changes in his blood counts.  Patient declines bone marrow biopsy at this time. Hemochromatosis carrier - Continue with phlebotomy for polycythemia as above, will periodically check iron (annually). - Management of iron deficiency in the setting of secondary polycythemia is poorly defined, but it is reasonable to consider combination of phlebotomy in addition to oral iron therapy in order to improve iron stores while safely maintaining an asymptomatic hematocrit level. - Continue taking ferrous sulfate 325 mg OTC daily.   Sleep apnea, unspecified type Continue CPAP    Orders Placed This Encounter  Procedures   Ferritin    Standing Status:   Standing    Number of Occurrences:   6    Expiration Date:   12/21/2025    Release to patient:   Immediate [1]   Iron and TIBC    Standing Status:   Standing    Number of Occurrences:   6    Expiration Date:   12/21/2025    Release to patient:   Immediate [1]   CBC with Differential    Standing Status:   Standing    Number of Occurrences:   5    Expiration Date:   12/21/2025    INTERVAL HISTORY: Patient returns for follow-up.[name] 74 y.o. male returns for routine follow-up of secondary polycythemia.     He last  had a phlebotomy on 07/05/2024.  Reports today, he is feeling very hot and cannot cool down.  He believes his hematocrit is very high.  Reports he missed his appointment about 6 weeks ago due to mixing up dates.  Reports he unable to sleep.  Has chronic 4 out of 10 knee and shoulder pain.   No recent hospitalizations, surgeries, or changes in baseline health status. He has been tolerating his phlebotomies well.  He reports that when he misses phlebotomy, he has increased symptoms of thirst, burning tongue, and excessive heat.   No erythromelalgia, aquagenic pruritus, Raynaud's, or vasomotor symptoms.   No fever, chills, or unintentional weight loss.   He remains compliant with CPAP.   He is taking oral ferrous sulfate once daily.  We reviewed CBC, iron panel and ferritin.  SUMMARY OF HEMATOLOGIC HISTORY: Oncology History   No problem history exists.   1.  Secondary polycythemia (JAK2 negative): - Patient tested NEGATIVE for JAK2 mutations, CALR, and MPL. - Erythropoietin  level was normal at 10 - Other work-up was normal (BCR/ABL, HIV, SPEP, ANA, CRP, RF) - He has sleep apnea and uses CPAP machine.  Lifelong non-smoker. - He started phlebotomies in February 2020, averaging phlebotomy every 6 to 8 weeks. - Most recent phlebotomy on 02/16/24. - Reports excessive heat, night sweats, burning tongue, and thirst when his Hgb/HCT is elevated (>40) - Denies aquagenic pruritus or other vasomotor symptoms.     -  Patient reports that he feels better when HCT < 40.  He felt terrible when we tried to stretch phlebotomy to every 3 months.   2.  Sleep apnea - Patient reports compliance with CPAP nightly   3.  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 - Significant iron deficiency noted in January 2023 with ferritin 7, iron saturation 7%, and TIBC 464 - He has been taking ferrous sulfate 325 mg daily since January  2023 - Most recent iron panel (11/17/2023): Ferritin 10, iron saturation 12%.     4.   Health Maintenance & Other: - Patient had a colonoscopy on 02/07/2019 that showed a 5 mm polyp in the ascending colon that showed tubular adenoma.  Diverticulosis in the rectosigmoid colon and the ascending colon.  Torturous colon.  External and internal hemorrhoids. - Elevated PSA:  Patient has a PSA of 9.2, followed by [facility].  Prostate biopsy on 03/30/2017 with 12 cores submitted that were benign. - Positive hep B serology:  Patient has positive hep B surface antibody and positive hep B core antibody. Surface antigen and IgM are negative.  Hep C and HIV are negative. Patient is immune based on prior exposure.  CBC    Component Value Date/Time   WBC 7.8 12/21/2024 1329   RBC 5.80 12/21/2024 1329   HGB 16.5 12/21/2024 1329   HGB 13.6 10/05/2024 1250   HCT 50.0 12/21/2024 1329   HCT 45.0 10/05/2024 1250   PLT 221 12/21/2024 1329   PLT 274 10/05/2024 1250   MCV 86.2 12/21/2024 1329   MCV 81 10/05/2024 1250   MCH 28.4 12/21/2024 1329   MCHC 33.0 12/21/2024 1329   RDW 17.5 (H) 12/21/2024 1329   RDW 16.2 (H) 10/05/2024 1250   LYMPHSABS 1.5 12/21/2024 1329   LYMPHSABS 1.4 10/05/2024 1250   MONOABS 0.6 12/21/2024 1329   EOSABS 0.2 12/21/2024 1329   EOSABS 0.1 10/05/2024 1250   BASOSABS 0.1 12/21/2024 1329   BASOSABS 0.1 10/05/2024 1250       Latest Ref Rng & Units 10/05/2024   12:50 PM 02/28/2024    8:01 AM 08/26/2023   10:55 AM  CMP  Glucose 70 - 99 mg/dL 739  840  809   BUN 8 - 27 mg/dL 17  12  17    Creatinine 0.76 - 1.27 mg/dL 8.79  8.70  8.75   Sodium 134 - 144 mmol/L 137  136  136   Potassium 3.5 - 5.2 mmol/L 5.0  4.3  4.7   Chloride 96 - 106 mmol/L 100  101  101   CO2 20 - 29 mmol/L 20  20  21    Calcium  8.6 - 10.2 mg/dL 9.6  9.4  9.2   Total Protein 6.0 - 8.5 g/dL 6.9  7.1  6.8   Total Bilirubin 0.0 - 1.2 mg/dL 0.6  0.9  0.6   Alkaline Phos 47 - 123 IU/L 82  83  91   AST 0 -  40 IU/L 19  22  20    ALT 0 - 44 IU/L 16  15  21       Lab Results  Component Value Date   FERRITIN 23 (L) 12/21/2024   VITAMINB12 252 09/20/2020    Vitals:   12/21/24 1436  BP: 135/82  Pulse: (!) 55  Resp: 18  Temp: 98.4 F (36.9 C)  SpO2: 96%    Review of System:  Review of  Systems  Constitutional:  Positive for malaise/fatigue.  Musculoskeletal:  Positive for joint pain.  Psychiatric/Behavioral:  The patient has insomnia.     Physical Exam: Physical Exam Constitutional:      Appearance: Normal appearance.  HENT:     Head: Normocephalic and atraumatic.  Eyes:     Pupils: Pupils are equal, round, and reactive to light.  Cardiovascular:     Rate and Rhythm: Normal rate and regular rhythm.     Heart sounds: Normal heart sounds. No murmur heard. Pulmonary:     Effort: Pulmonary effort is normal.     Breath sounds: Normal breath sounds. No wheezing.  Abdominal:     General: Bowel sounds are normal. There is no distension.     Palpations: Abdomen is soft.     Tenderness: There is no abdominal tenderness.  Musculoskeletal:        General: Normal range of motion.     Cervical back: Normal range of motion.  Skin:    General: Skin is warm and dry.     Findings: No rash.  Neurological:     Mental Status: He is alert and oriented to person, place, and time.     Gait: Gait is intact.  Psychiatric:        Mood and Affect: Mood and affect normal.        Cognition and Memory: Memory normal.        Judgment: Judgment normal.      I spent 20 minutes dedicated to the care of this patient (face-to-face and non-face-to-face) on the date of the encounter to include what is described in the assessment and plan.,  Timothy Hope, NP 12/21/2024 2:59 PM "

## 2024-12-21 NOTE — Progress Notes (Signed)
 Geo Slone presents today for theraputic phlebotomy per MD orders. Last hgb 16.5/hct 50 was on 12/21/24 VSS prior to procedure. Pt reports eating before arrival. Procedure started at 1502 using patients right AC. 500 mL of blood removed. Procedure ended at 1507. Gauze and coban applied to [facility], site clean and dry. VSS upon completion of procedure. Pt denies dizziness, lightheadedness, or feeling faint. Observed for 10 minutes post procedure. Patient did not want to the additional 20 minute wait time per policy.   Treatment given today per MD orders. Tolerated infusion without adverse affects. Vital signs stable. No complaints at this time. Discharged from clinic ambulatory in stable condition. Alert and oriented x 3. F/U with [facility] as scheduled.

## 2024-12-21 NOTE — Assessment & Plan Note (Addendum)
-   Continue with phlebotomy for polycythemia as above, will periodically check iron (annually). - Management of iron deficiency in the setting of secondary polycythemia is poorly defined, but it is reasonable to consider combination of phlebotomy in addition to oral iron therapy in order to improve iron stores while safely maintaining an asymptomatic hematocrit level. - Continue taking ferrous sulfate 325 mg OTC daily.

## 2025-01-08 NOTE — Progress Notes (Signed)
 [name]                                          MRN: [id number]   01/08/2025   The [facility] Quality Team Specialist reviewed this patient medical record for the purposes of chart review for care gap closure. The following were reviewed: chart review for care gap closure-glycemic status assessment.    [facility]BCI Quality Team

## 2025-01-11 ENCOUNTER — Ambulatory Visit: Payer: Self-pay | Admitting: Family Medicine

## 2025-01-11 ENCOUNTER — Telehealth: Payer: Self-pay

## 2025-01-11 ENCOUNTER — Encounter: Payer: Self-pay | Admitting: Family Medicine

## 2025-01-11 ENCOUNTER — Telehealth: Payer: Self-pay | Admitting: Family Medicine

## 2025-01-11 VITALS — BP 135/67 | HR 51 | Ht 71.0 in | Wt 250.0 lb

## 2025-01-11 DIAGNOSIS — G4709 Other insomnia: Secondary | ICD-10-CM

## 2025-01-11 DIAGNOSIS — I152 Hypertension secondary to endocrine disorders: Secondary | ICD-10-CM | POA: Diagnosis not present

## 2025-01-11 DIAGNOSIS — R972 Elevated prostate specific antigen [PSA]: Secondary | ICD-10-CM | POA: Diagnosis not present

## 2025-01-11 DIAGNOSIS — E1169 Type 2 diabetes mellitus with other specified complication: Secondary | ICD-10-CM

## 2025-01-11 DIAGNOSIS — Z794 Long term (current) use of insulin: Secondary | ICD-10-CM

## 2025-01-11 DIAGNOSIS — E66811 Obesity, class 1: Secondary | ICD-10-CM

## 2025-01-11 DIAGNOSIS — E1159 Type 2 diabetes mellitus with other circulatory complications: Secondary | ICD-10-CM | POA: Diagnosis not present

## 2025-01-11 DIAGNOSIS — E7800 Pure hypercholesterolemia, unspecified: Secondary | ICD-10-CM

## 2025-01-11 DIAGNOSIS — E669 Obesity, unspecified: Secondary | ICD-10-CM

## 2025-01-11 LAB — BAYER DCA HB A1C WAIVED: HB A1C (BAYER DCA - WAIVED): 14 % — ABNORMAL HIGH (ref 4.8–5.6)

## 2025-01-11 MED ORDER — IRBESARTAN 150 MG PO TABS: 150.0000 mg | ORAL_TABLET | Freq: Every day | ORAL | 3 refills | Status: AC

## 2025-01-11 MED ORDER — TRAZODONE HCL 50 MG PO TABS: 50.0000 mg | ORAL_TABLET | Freq: Every evening | ORAL | 3 refills | Status: AC | PRN

## 2025-01-11 MED ORDER — TAMSULOSIN HCL 0.4 MG PO CAPS: 0.4000 mg | ORAL_CAPSULE | Freq: Every day | ORAL | 3 refills | Status: AC

## 2025-01-11 MED ORDER — HYDROXYZINE HCL 25 MG PO TABS: 25.0000 mg | ORAL_TABLET | ORAL | 3 refills | Status: AC | PRN

## 2025-01-11 MED ORDER — TRULICITY 1.5 MG/0.5ML ~~LOC~~ SOAJ: 1.5000 mg | SUBCUTANEOUS | 3 refills | Status: AC

## 2025-01-11 MED ORDER — FREESTYLE LIBRE 3 PLUS SENSOR MISC: 11 refills | Status: AC

## 2025-01-11 MED ORDER — TRESIBA FLEXTOUCH 200 UNIT/ML ~~LOC~~ SOPN: 16.0000 [IU] | PEN_INJECTOR | SUBCUTANEOUS | 3 refills | Status: DC

## 2025-01-11 MED ORDER — GABAPENTIN 100 MG PO CAPS: 200.0000 mg | ORAL_CAPSULE | Freq: Every day | ORAL | 3 refills | Status: AC

## 2025-01-11 MED ORDER — METFORMIN HCL 1000 MG PO TABS: 1000.0000 mg | ORAL_TABLET | Freq: Two times a day (BID) | ORAL | 3 refills | Status: AC

## 2025-01-11 MED ORDER — ROSUVASTATIN CALCIUM 5 MG PO TABS: 5.0000 mg | ORAL_TABLET | Freq: Every day | ORAL | 3 refills | Status: AC

## 2025-01-11 NOTE — Telephone Encounter (Unsigned)
 Copied from CRM [id number]. Topic: Clinical - Prescription Issue >> Jan 11, 2025  3:11 PM Wess RAMAN wrote: Reason for CRM: Pharmacy stated patient's insurance is denying insulin degludec  (TRESIBA  FLEXTOUCH) 200 UNIT/ML FlexTouch Pen. They are recommending Toujeo max or lantus   Pharmacy: [facility] : [facility] - [place], [place] - [address] [place] [address] Phone: [phone] Fax: [phone] Hours: Not open 24 hours

## 2025-01-11 NOTE — Progress Notes (Signed)
 Care Guide Pharmacy Note  01/11/2025 Name: [name] MRN: [id number] DOB: [birth year]  Referred By: Dettinger, Fonda LABOR, MD Reason for referral: Complex Care Management (Outreach to schedule with Pharm d )   [name] is a 74 y.o. year old male who is a primary care patient of Dettinger, Fonda LABOR, MD.  [name] was referred to the pharmacist for assistance related to: HTN and DMII  An unsuccessful telephone outreach was attempted today to contact the patient who was referred to the pharmacy team for assistance with medication assistance. Additional attempts will be made to contact the patient.  [name] , RMA     [facility]  [facility] Guide  Direct Dial: [phone]  Website: [email or web address]

## 2025-01-11 NOTE — Progress Notes (Signed)
 "  BP 135/67   Pulse (!) 51   Ht 5' 11 (1.803 m)   Wt 250 lb (113.4 kg)   SpO2 96%   BMI 34.87 kg/m    Subjective:   Patient ID: [name], male    DOB: [birth year], 74 y.o.   MRN: [id number]  HPI: [name] is a 74 y.o. male presenting on 01/11/2025 for Medical Management of Chronic Issues, Diabetes, and Hypertension   Discussed the use of AI scribe software for clinical note transcription with the patient, who gave verbal consent to proceed.  History of Present Illness   [name] is a 74 year old male with diabetes who presents for management of his blood sugar levels and hip pain.  Hyperglycemia management - Difficulty managing diabetes due to running out of Freestyle Libre sensors - Currently using Tresiba  11 units at 8:00 PM - Diet consists of bacon and eggs for breakfast, salads or subs for lunch, and chili, salad, or vegetables for dinner - Prepares vegetables on Sundays for consumption throughout the week - Soups are frequently consumed, especially during cold weather - Avoids fast food and French fries  Right hip pain - Intermittent pain above the right hip - Pain worsens with walking and sitting - No increase in pain with pressure - Pain sometimes improves with bending forward - Pain is persistent but not constant, can disappear for a day before returning - Ibuprofen  and Tylenol  have not provided relief - Hot baths have not been effective  Respiratory symptoms - Dry cough in the mornings - Symptoms attributed to CPAP machine use - Increasing humidity setting on CPAP has reduced epistaxis - Epistaxis occurred when household humidity decreased during winter  Medication tolerance - Currently taking rosuvastatin  (Crestor ) without issues  Blood pressure - Recent blood pressure measured at 135/67          Relevant past medical, surgical, family and social history reviewed and updated as indicated. Interim medical history since our last  visit reviewed. Allergies and medications reviewed and updated.  Review of Systems  Constitutional:  Negative for chills and fever.  Eyes:  Negative for visual disturbance.  Respiratory:  Negative for shortness of breath and wheezing.   Cardiovascular:  Negative for chest pain and leg swelling.  Musculoskeletal:  Positive for arthralgias and back pain. Negative for gait problem.  Skin:  Negative for rash.  Neurological:  Negative for dizziness and light-headedness.  All other systems reviewed and are negative.   Per HPI unless specifically indicated above   Allergies as of 01/11/2025       Reactions   Sulfa  Antibiotics    rash   Ramipril  Cough        Medication List        Accurate as of January 11, 2025  1:23 PM. If you have any questions, ask your nurse or doctor.          STOP taking these medications    Lantus  SoloStar 100 UNIT/ML Solostar Pen Generic drug: insulin glargine  Stopped by: Timothy Levins, MD       TAKE these medications    acetaminophen  325 MG tablet Commonly known as: Tylenol  Take 2 tablets (650 mg total) by mouth every 6 (six) hours as needed for moderate pain or headache.   APPLE CIDER VINEGAR PO Take 1 Dose by mouth daily.   aspirin EC 81 MG tablet Take 81 mg by mouth every evening.   ferrous sulfate 325 (65 FE) MG tablet Take 325 mg  by mouth daily with breakfast.   FreeStyle Libre 3 Plus Sensor Misc Change sensor every 15 days.   FreeStyle Libre 3 Reader Devi 1 each by Does not apply route 4 (four) times daily.   gabapentin  100 MG capsule Commonly known as: NEURONTIN  Take 2 capsules (200 mg total) by mouth at bedtime.   hydrOXYzine  25 MG tablet Commonly known as: ATARAX  Take 1 tablet (25 mg total) by mouth as needed. Cuts in half as needed   irbesartan  150 MG tablet Commonly known as: AVAPRO  Take 1 tablet (150 mg total) by mouth daily.   metFORMIN  1000 MG tablet Commonly known as: GLUCOPHAGE  Take 1 tablet (1,000  mg total) by mouth 2 (two) times daily with a meal.   multivitamin with minerals tablet Take 1 tablet by mouth daily.   OneTouch Verio test strip Generic drug: glucose blood CHECK BLOOD GLUCOSE ONCE DAILY Dx E11.69   PROBIOTIC PO Take 1 capsule by mouth 3 (three) times a week.   QC TUMERIC COMPLEX PO Take by mouth.   rosuvastatin  5 MG tablet Commonly known as: CRESTOR  Take 1 tablet (5 mg total) by mouth at bedtime.   tamsulosin  0.4 MG Caps capsule Commonly known as: FLOMAX  Take 1 capsule (0.4 mg total) by mouth daily after supper.   traZODone  50 MG tablet Commonly known as: DESYREL  Take 1-2 tablets (50-100 mg total) by mouth at bedtime as needed for sleep (For insomnia as needed).   Tresiba  FlexTouch 200 UNIT/ML FlexTouch Pen Generic drug: insulin degludec  Inject 16-20 Units into the skin daily. Started by: Timothy Levins, MD   Trulicity  1.5 MG/0.5ML Soaj Generic drug: Dulaglutide  Inject 1.5 mg into the skin once a week. Started by: Timothy Levins, MD   Vitamin D -3 125 MCG (5000 UT) Tabs Take by mouth. What changed: additional instructions         Objective:   BP 135/67   Pulse (!) 51   Ht 5' 11 (1.803 m)   Wt 250 lb (113.4 kg)   SpO2 96%   BMI 34.87 kg/m   Wt Readings from Last 3 Encounters:  01/11/25 250 lb (113.4 kg)  12/21/24 253 lb (114.8 kg)  10/05/24 252 lb (114.3 kg)    Physical Exam Physical Exam   VITALS: BP- 135/67 CHEST: Lungs clear to auscultation. CARDIOVASCULAR: Regular heart rate and rhythm, no murmurs. Peripheral pulses intact, no edema.         Assessment & Plan:   Problem List Items Addressed This Visit       Cardiovascular and Mediastinum   Hypertension associated with diabetes (HCC)   Relevant Medications   irbesartan  (AVAPRO ) 150 MG tablet   metFORMIN  (GLUCOPHAGE ) 1000 MG tablet   rosuvastatin  (CRESTOR ) 5 MG tablet   Dulaglutide  (TRULICITY ) 1.5 MG/0.5ML SOAJ   insulin degludec  (TRESIBA  FLEXTOUCH) 200 UNIT/ML  FlexTouch Pen     Endocrine   Type 2 diabetes mellitus in patient with obesity (HCC)   Relevant Medications   Continuous Glucose Sensor (FREESTYLE LIBRE 3 PLUS SENSOR) MISC   gabapentin  (NEURONTIN ) 100 MG capsule   hydrOXYzine  (ATARAX ) 25 MG tablet   irbesartan  (AVAPRO ) 150 MG tablet   metFORMIN  (GLUCOPHAGE ) 1000 MG tablet   rosuvastatin  (CRESTOR ) 5 MG tablet   Dulaglutide  (TRULICITY ) 1.5 MG/0.5ML SOAJ   insulin degludec  (TRESIBA  FLEXTOUCH) 200 UNIT/ML FlexTouch Pen   Other Relevant Orders   AMB Referral VBCI Care Management   Type 2 diabetes mellitus with hypercholesterolemia (HCC) - Primary   Relevant Medications   irbesartan  (AVAPRO )  150 MG tablet   metFORMIN  (GLUCOPHAGE ) 1000 MG tablet   rosuvastatin  (CRESTOR ) 5 MG tablet   Dulaglutide  (TRULICITY ) 1.5 MG/0.5ML SOAJ   insulin degludec  (TRESIBA  FLEXTOUCH) 200 UNIT/ML FlexTouch Pen   Other Relevant Orders   Bayer DCA Hb A1c Waived   Microalbumin / creatinine urine ratio   AMB Referral VBCI Care Management     Other   Elevated PSA   Relevant Medications   tamsulosin  (FLOMAX ) 0.4 MG CAPS capsule   Other Visit Diagnoses       Other insomnia       Relevant Medications   traZODone  (DESYREL ) 50 MG tablet          Type 2 diabetes mellitus Managed with metformin  and Tresiba . Requires refill of Freestyle Libre sensors. - Refilled Freestyle Libre sensors. - Continue metformin  and Tresiba .  Sacroiliac joint pain Intermittent pain likely due to arthritis. Ibuprofen  and Tylenol  ineffective. Discussed potential referral for steroid injection. - Consider referral to orthopedics for possible steroid injection if pain becomes intolerable.  General Health Maintenance Blood pressure well-controlled. Occasional dry cough likely related to CPAP use. Adjusted CPAP humidity settings. - Continue rosuvastatin  (Crestor ). - Adjust CPAP humidity settings as needed. - Consider using a room humidifier to maintain optimal humidity levels.      Do not know what patient is actually taking on his medicine, his A1c is greater than 14, will restart him on Trulicity  and resend some Tresiba  and also send the freestyle libre 3 sensors for him.  Also placed referral back to clinical pharmacy.     Follow up plan: Return in about 3 months (around 04/11/2025), or if symptoms worsen or fail to improve.  Counseling provided for all of the vaccine components Orders Placed This Encounter  Procedures   Bayer DCA Hb A1c Waived   Microalbumin / creatinine urine ratio   AMB Referral VBCI Care Management    Timothy Levins, MD [facility] 01/11/2025, 1:23 PM     "

## 2025-01-12 LAB — MICROALBUMIN / CREATININE URINE RATIO
Creatinine, Urine: 188 mg/dL
Microalb/Creat Ratio: 70 mg/g{creat} — ABNORMAL HIGH (ref 0–29)
Microalbumin, Urine: 132.1 ug/mL

## 2025-01-12 MED ORDER — TOUJEO MAX SOLOSTAR 300 UNIT/ML ~~LOC~~ SOPN: 16.0000 [IU] | PEN_INJECTOR | Freq: Every day | SUBCUTANEOUS | 3 refills | Status: AC

## 2025-01-12 NOTE — Telephone Encounter (Signed)
 Sent Toujeo  max because it said that the other 1 was not covered.

## 2025-01-12 NOTE — Telephone Encounter (Signed)
 Called and spoke with patient and made him aware.

## 2025-01-15 ENCOUNTER — Ambulatory Visit: Payer: Self-pay | Admitting: Family Medicine

## 2025-01-15 NOTE — Progress Notes (Unsigned)
 Care Guide Pharmacy Note  01/15/2025 Name: Calyb Mcquarrie MRN: [id number] DOB: [birth year]  Referred By: Dettinger, Fonda LABOR, MD Reason for referral: Complex Care Management (Outreach to schedule with Pharm d )   Billyjack Trompeter is a 74 y.o. year old male who is a primary care patient of Dettinger, Fonda LABOR, MD.  Helayne Marina was referred to the pharmacist for assistance related to: DMII  A second unsuccessful telephone outreach was attempted today to contact the patient who was referred to the pharmacy team for assistance with medication management. Additional attempts will be made to contact the patient.  Jeoffrey Buffalo , RMA     [facility]  [facility], [facility] Guide  Direct Dial: [phone]  Website: [email or web address]

## 2025-01-18 NOTE — Progress Notes (Signed)
 Care Guide Pharmacy Note  01/18/2025 Name: Ashish Rossetti MRN: [id number] DOB: Jan 17, 1951  Referred By: Dettinger, Fonda LABOR, MD Reason for referral: Complex Care Management (Outreach to schedule with Pharm d )   Turrell Severt is a 74 y.o. year old male who is a primary care patient of Dettinger, Fonda LABOR, MD.  Janari Yamada was referred to the pharmacist for assistance related to: DMII  A third unsuccessful telephone outreach was attempted today to contact the patient who was referred to the pharmacy team for assistance with medication management. The Population Health team is pleased to engage with this patient at any time in the future upon receipt of referral and should he/she be interested in assistance from the [facility] team.  Jeoffrey Buffalo , RMA     [facility]  [facility], [facility] Guide  Direct Dial: [phone]  Website: [email or web address]

## 2025-02-01 ENCOUNTER — Inpatient Hospital Stay: Attending: Hematology

## 2025-02-01 ENCOUNTER — Inpatient Hospital Stay

## 2025-03-15 ENCOUNTER — Inpatient Hospital Stay

## 2025-03-15 ENCOUNTER — Inpatient Hospital Stay: Attending: Hematology

## 2025-04-12 ENCOUNTER — Ambulatory Visit: Admitting: Family Medicine

## 2025-04-26 ENCOUNTER — Inpatient Hospital Stay: Attending: Hematology

## 2025-04-26 ENCOUNTER — Inpatient Hospital Stay

## 2025-06-07 ENCOUNTER — Inpatient Hospital Stay: Attending: Hematology

## 2025-06-07 ENCOUNTER — Inpatient Hospital Stay

## 2025-06-07 ENCOUNTER — Inpatient Hospital Stay: Admitting: Oncology

## 2025-07-19 ENCOUNTER — Inpatient Hospital Stay

## 2025-07-19 ENCOUNTER — Inpatient Hospital Stay: Admitting: Oncology

## 2025-09-28 ENCOUNTER — Ambulatory Visit: Payer: Self-pay
